# Patient Record
Sex: Female | Born: 1945 | Race: Black or African American | Hispanic: Yes | Marital: Married | Smoking: Never smoker
Health system: Southern US, Community
[De-identification: ages and names within clinical notes are randomized; demographics above are authoritative.]

## PROBLEM LIST (undated history)

## (undated) DIAGNOSIS — R51 Headache: Secondary | ICD-10-CM

## (undated) DIAGNOSIS — R600 Localized edema: Secondary | ICD-10-CM

## (undated) DIAGNOSIS — R002 Palpitations: Secondary | ICD-10-CM

## (undated) DIAGNOSIS — E785 Hyperlipidemia, unspecified: Secondary | ICD-10-CM

## (undated) DIAGNOSIS — R079 Chest pain, unspecified: Secondary | ICD-10-CM

## (undated) DIAGNOSIS — M2550 Pain in unspecified joint: Secondary | ICD-10-CM

## (undated) DIAGNOSIS — Z6841 Body Mass Index (BMI) 40.0 and over, adult: Secondary | ICD-10-CM

## (undated) DIAGNOSIS — R7303 Prediabetes: Secondary | ICD-10-CM

## (undated) DIAGNOSIS — F32A Depression, unspecified: Secondary | ICD-10-CM

## (undated) DIAGNOSIS — M549 Dorsalgia, unspecified: Secondary | ICD-10-CM

## (undated) DIAGNOSIS — L659 Nonscarring hair loss, unspecified: Secondary | ICD-10-CM

## (undated) DIAGNOSIS — R1310 Dysphagia, unspecified: Secondary | ICD-10-CM

## (undated) DIAGNOSIS — G8929 Other chronic pain: Secondary | ICD-10-CM

## (undated) DIAGNOSIS — E739 Lactose intolerance, unspecified: Secondary | ICD-10-CM

## (undated) DIAGNOSIS — N182 Chronic kidney disease, stage 2 (mild): Secondary | ICD-10-CM

## (undated) DIAGNOSIS — K5900 Constipation, unspecified: Secondary | ICD-10-CM

## (undated) DIAGNOSIS — Z86010 Personal history of colonic polyps: Secondary | ICD-10-CM

## (undated) DIAGNOSIS — I10 Essential (primary) hypertension: Secondary | ICD-10-CM

## (undated) DIAGNOSIS — G894 Chronic pain syndrome: Secondary | ICD-10-CM

## (undated) DIAGNOSIS — F329 Major depressive disorder, single episode, unspecified: Secondary | ICD-10-CM

## (undated) DIAGNOSIS — E559 Vitamin D deficiency, unspecified: Secondary | ICD-10-CM

## (undated) DIAGNOSIS — K219 Gastro-esophageal reflux disease without esophagitis: Secondary | ICD-10-CM

## (undated) DIAGNOSIS — G4730 Sleep apnea, unspecified: Secondary | ICD-10-CM

## (undated) DIAGNOSIS — J309 Allergic rhinitis, unspecified: Secondary | ICD-10-CM

## (undated) HISTORY — PX: BREAST EXCISIONAL BIOPSY: SUR124

## (undated) HISTORY — DX: Sleep apnea, unspecified: G47.30

## (undated) HISTORY — DX: Body Mass Index (BMI) 40.0 and over, adult: Z684

## (undated) HISTORY — DX: Morbid (severe) obesity due to excess calories: E66.01

## (undated) HISTORY — DX: Palpitations: R00.2

## (undated) HISTORY — DX: Chronic pain syndrome: G89.4

## (undated) HISTORY — DX: Other chronic pain: G89.29

## (undated) HISTORY — DX: Pain in unspecified joint: M25.50

## (undated) HISTORY — DX: Depression, unspecified: F32.A

## (undated) HISTORY — DX: Essential (primary) hypertension: I10

## (undated) HISTORY — PX: HAND SURGERY: SHX662

## (undated) HISTORY — DX: Nonscarring hair loss, unspecified: L65.9

## (undated) HISTORY — PX: COLONOSCOPY: SHX174

## (undated) HISTORY — DX: Dorsalgia, unspecified: M54.9

## (undated) HISTORY — DX: Constipation, unspecified: K59.00

## (undated) HISTORY — DX: Chest pain, unspecified: R07.9

## (undated) HISTORY — DX: Allergic rhinitis, unspecified: J30.9

## (undated) HISTORY — DX: Lactose intolerance, unspecified: E73.9

## (undated) HISTORY — DX: Vitamin D deficiency, unspecified: E55.9

## (undated) HISTORY — DX: Hyperlipidemia, unspecified: E78.5

## (undated) HISTORY — DX: Headache: R51

## (undated) HISTORY — DX: Localized edema: R60.0

## (undated) HISTORY — DX: Personal history of colonic polyps: Z86.010

## (undated) HISTORY — DX: Dysphagia, unspecified: R13.10

## (undated) HISTORY — PX: VESICOVAGINAL FISTULA CLOSURE W/ TAH: SUR271

## (undated) HISTORY — PX: TUBAL LIGATION: SHX77

## (undated) HISTORY — PX: OTHER SURGICAL HISTORY: SHX169

## (undated) HISTORY — DX: Chronic kidney disease, stage 2 (mild): N18.2

---
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
## 1973-04-16 HISTORY — PX: ABDOMINAL HYSTERECTOMY: SHX81

## 1998-10-20 ENCOUNTER — Ambulatory Visit (HOSPITAL_COMMUNITY): Admission: RE | Admit: 1998-10-20 | Discharge: 1998-10-20 | Payer: Self-pay | Admitting: Gastroenterology

## 1999-10-20 ENCOUNTER — Emergency Department (HOSPITAL_COMMUNITY): Admission: EM | Admit: 1999-10-20 | Discharge: 1999-10-20 | Payer: Self-pay | Admitting: Emergency Medicine

## 2000-09-14 ENCOUNTER — Emergency Department (HOSPITAL_COMMUNITY): Admission: EM | Admit: 2000-09-14 | Discharge: 2000-09-14 | Payer: Self-pay | Admitting: Emergency Medicine

## 2000-09-14 ENCOUNTER — Encounter: Payer: Self-pay | Admitting: Emergency Medicine

## 2000-09-25 ENCOUNTER — Other Ambulatory Visit: Admission: RE | Admit: 2000-09-25 | Discharge: 2000-09-25 | Payer: Self-pay | Admitting: Obstetrics and Gynecology

## 2000-11-26 ENCOUNTER — Ambulatory Visit (HOSPITAL_COMMUNITY): Admission: RE | Admit: 2000-11-26 | Discharge: 2000-11-26 | Payer: Self-pay | Admitting: Gastroenterology

## 2001-03-12 ENCOUNTER — Encounter: Admission: RE | Admit: 2001-03-12 | Discharge: 2001-03-12 | Payer: Self-pay | Admitting: Emergency Medicine

## 2001-03-12 ENCOUNTER — Encounter: Payer: Self-pay | Admitting: Emergency Medicine

## 2001-08-03 ENCOUNTER — Emergency Department (HOSPITAL_COMMUNITY): Admission: EM | Admit: 2001-08-03 | Discharge: 2001-08-03 | Payer: Self-pay | Admitting: Emergency Medicine

## 2001-08-03 ENCOUNTER — Encounter: Payer: Self-pay | Admitting: Emergency Medicine

## 2002-05-15 ENCOUNTER — Ambulatory Visit (HOSPITAL_BASED_OUTPATIENT_CLINIC_OR_DEPARTMENT_OTHER): Admission: RE | Admit: 2002-05-15 | Discharge: 2002-05-15 | Payer: Self-pay | Admitting: Emergency Medicine

## 2003-10-20 ENCOUNTER — Encounter: Admission: RE | Admit: 2003-10-20 | Discharge: 2003-10-20 | Payer: Self-pay | Admitting: Obstetrics and Gynecology

## 2003-11-03 ENCOUNTER — Encounter: Admission: RE | Admit: 2003-11-03 | Discharge: 2003-11-03 | Payer: Self-pay | Admitting: Obstetrics and Gynecology

## 2003-11-12 ENCOUNTER — Encounter: Admission: RE | Admit: 2003-11-12 | Discharge: 2003-11-12 | Payer: Self-pay | Admitting: Obstetrics and Gynecology

## 2004-01-05 ENCOUNTER — Encounter: Admission: RE | Admit: 2004-01-05 | Discharge: 2004-01-05 | Payer: Self-pay | Admitting: Obstetrics and Gynecology

## 2004-07-12 ENCOUNTER — Encounter: Admission: RE | Admit: 2004-07-12 | Discharge: 2004-07-12 | Payer: Self-pay | Admitting: Obstetrics and Gynecology

## 2004-11-07 ENCOUNTER — Encounter: Admission: RE | Admit: 2004-11-07 | Discharge: 2004-11-07 | Payer: Self-pay | Admitting: Obstetrics and Gynecology

## 2005-08-01 ENCOUNTER — Encounter: Admission: RE | Admit: 2005-08-01 | Discharge: 2005-08-01 | Payer: Self-pay | Admitting: Emergency Medicine

## 2005-11-08 ENCOUNTER — Encounter: Admission: RE | Admit: 2005-11-08 | Discharge: 2005-11-08 | Payer: Self-pay | Admitting: Obstetrics and Gynecology

## 2006-03-14 ENCOUNTER — Encounter: Admission: RE | Admit: 2006-03-14 | Discharge: 2006-03-14 | Payer: Self-pay | Admitting: Gastroenterology

## 2006-04-16 HISTORY — PX: EYE SURGERY: SHX253

## 2006-10-03 ENCOUNTER — Encounter: Admission: RE | Admit: 2006-10-03 | Discharge: 2006-10-03 | Payer: Self-pay | Admitting: Emergency Medicine

## 2006-11-04 ENCOUNTER — Encounter: Admission: RE | Admit: 2006-11-04 | Discharge: 2006-11-04 | Payer: Self-pay | Admitting: Emergency Medicine

## 2006-11-11 ENCOUNTER — Encounter: Admission: RE | Admit: 2006-11-11 | Discharge: 2006-11-11 | Payer: Self-pay | Admitting: Obstetrics and Gynecology

## 2006-12-07 ENCOUNTER — Encounter: Admission: RE | Admit: 2006-12-07 | Discharge: 2006-12-07 | Payer: Self-pay | Admitting: Emergency Medicine

## 2007-02-05 ENCOUNTER — Encounter: Admission: RE | Admit: 2007-02-05 | Discharge: 2007-02-05 | Payer: Self-pay | Admitting: Obstetrics and Gynecology

## 2007-08-04 ENCOUNTER — Encounter: Admission: RE | Admit: 2007-08-04 | Discharge: 2007-08-04 | Payer: Self-pay | Admitting: Emergency Medicine

## 2007-11-12 ENCOUNTER — Encounter: Admission: RE | Admit: 2007-11-12 | Discharge: 2007-11-12 | Payer: Self-pay | Admitting: Obstetrics and Gynecology

## 2008-12-14 ENCOUNTER — Encounter: Admission: RE | Admit: 2008-12-14 | Discharge: 2008-12-14 | Payer: Self-pay | Admitting: Obstetrics and Gynecology

## 2009-02-07 ENCOUNTER — Encounter: Admission: RE | Admit: 2009-02-07 | Discharge: 2009-02-07 | Payer: Self-pay | Admitting: Family Medicine

## 2010-02-06 ENCOUNTER — Encounter: Admission: RE | Admit: 2010-02-06 | Discharge: 2010-02-06 | Payer: Self-pay | Admitting: Obstetrics and Gynecology

## 2010-09-01 NOTE — Procedures (Signed)
[place]  Patient:    Beth Sullivan, Beth Sullivan                        MRN: [id number] Proc. Date: 11/27/00 Adm. Date:  [id number] Attending:  Charna Elizabeth CC:         Reuben Likes, M.D.   Procedure Report  DATE OF BIRTH:  1945/10/05  REFERRING PHYSICIAN:  Reuben Likes, M.D.  PROCEDURE PERFORMED:  Esophagogastroduodenoscopy.  ENDOSCOPIST:  Anselmo Rod, M.D.  INSTRUMENT USED:  Olympus video panendoscope.  INDICATIONS FOR PROCEDURE:  Epigastric pain in a 65 year old African-American female rule out ulcer disease.  PREPROCEDURE PREPARATION:  Informed consent was procured from the patient. The patient was fasted for eight hours prior to the procedure.  PREPROCEDURE PHYSICAL:  The patient had stable vital signs.  Neck supple. Chest clear to auscultation.  S1, S2 regular.  Abdomen soft with epigastric tenderness on palpation, no guarding, no rebound, no rigidity, no hepatosplenomegaly.  No masses palpable.  DESCRIPTION OF PROCEDURE:  The patient was placed in left lateral decubitus position and sedated with 70 mg of Demerol and 7 mg of Versed intravenously. Once the patient was adequately sedated and maintained on low-flow oxygen and continuous cardiac monitoring, the Olympus video panendoscope was advanced through the mouthpiece, over the tongue, into the esophagus under direct vision.  The entire esophagus appeared normal without evidence of ring, stricture, masses, lesions, esophagitis or Barretts mucosa.  The scope was then advanced to the stomach.  Except for a small hiatal hernia seen on high retroflexion, the entire gastric mucosa and the proximal small bowel appeared normal.  IMPRESSION:  Essentially normal esophagogastroduodenoscopy except for small hiatal hernia.  RECOMMENDATION: 1. Continue proton pump inhibitor for now. 2. Antireflux measures. 3. Proceed with colonoscopy. 4. Outpatient follow-up for further  recommendations. DD:  11/27/00 TD:  11/27/00 Job: [id number]

## 2010-09-01 NOTE — Procedures (Signed)
[place]. [facility]  Patient:    Beth Sullivan, Beth Sullivan                        MRN: [id number] Proc. Date: 11/27/00 Adm. Date:  [id number] Attending:  Charna Elizabeth CC:         Reuben Likes, M.D.   Procedure Report  DATE OF BIRTH:  Aug 21, 1945.  REFERRING PHYSICIAN:  Reuben Likes, M.D.  PROCEDURE PERFORMED:  Colonoscopy.  ENDOSCOPIST:  Anselmo Rod, M.D.  INSTRUMENT USED:  Olympus pediatric video colonoscope.  INDICATIONS FOR PROCEDURE:  Family history of cancerous polyps in a 65 year old female rule out colonic polyps.  PREPROCEDURE PREPARATION:  Informed consent was procured from the patient. The patient was fasted for eight hours prior to the procedure and prepped with a bottle of magnesium citrate and a gallon of NuLytely the night prior to the procedure.  PREPROCEDURE PHYSICAL:  The patient had stable vital signs.  Neck supple. Chest clear to auscultation.  S1, S2 regular.  Abdomen soft with epigastric tenderness on palpation, no guarding, no rebound, no rigidity, no hepatosplenomegaly.  DESCRIPTION OF PROCEDURE:  The patient was placed in the left lateral decubitus position and sedated with Versed and Demerol for the EGD.  No additional sedation was used for the colonoscopy.  Once the patient was adequately positioned and maintained on low-flow oxygen and continuous cardiac monitoring, the Olympus video colonoscope was advanced from the rectum to the cecum without difficulty.  The entire colonic mucosa appeared healthy with a normal vascular pattern.  No erosions, ulcerations, masses, polyps or diverticula were seen.  IMPRESSION:  Normal colonoscopy.  RECOMMENDATIONS:  Considering the patients family history, repeat colorectal cancer screening is recommended in the next five years unless to were to develop any abnormal symptoms in the interim.DD:  11/27/00 TD:  11/27/00 Job: [id number]

## 2011-01-01 ENCOUNTER — Other Ambulatory Visit: Payer: Self-pay | Admitting: Obstetrics and Gynecology

## 2011-01-01 DIAGNOSIS — Z1231 Encounter for screening mammogram for malignant neoplasm of breast: Secondary | ICD-10-CM

## 2011-02-07 ENCOUNTER — Other Ambulatory Visit: Payer: Self-pay | Admitting: Family Medicine

## 2011-02-07 DIAGNOSIS — R202 Paresthesia of skin: Secondary | ICD-10-CM

## 2011-02-07 DIAGNOSIS — R209 Unspecified disturbances of skin sensation: Secondary | ICD-10-CM

## 2011-02-08 ENCOUNTER — Ambulatory Visit
Admission: RE | Admit: 2011-02-08 | Discharge: 2011-02-08 | Disposition: A | Discharge status: Home / Self Care | Payer: BC Managed Care – PPO | Source: Ambulatory Visit | Attending: Obstetrics and Gynecology | Admitting: Obstetrics and Gynecology

## 2011-02-08 DIAGNOSIS — Z1231 Encounter for screening mammogram for malignant neoplasm of breast: Secondary | ICD-10-CM

## 2011-02-11 ENCOUNTER — Ambulatory Visit
Admission: RE | Admit: 2011-02-11 | Discharge: 2011-02-11 | Disposition: A | Discharge status: Home / Self Care | Payer: BC Managed Care – PPO | Source: Ambulatory Visit | Attending: Family Medicine | Admitting: Family Medicine

## 2011-02-11 DIAGNOSIS — R202 Paresthesia of skin: Secondary | ICD-10-CM

## 2011-02-19 ENCOUNTER — Other Ambulatory Visit: Payer: Self-pay | Admitting: Neurological Surgery

## 2011-02-19 DIAGNOSIS — M542 Cervicalgia: Secondary | ICD-10-CM

## 2011-02-28 ENCOUNTER — Ambulatory Visit
Admission: RE | Admit: 2011-02-28 | Discharge: 2011-02-28 | Disposition: A | Discharge status: Home / Self Care | Payer: BC Managed Care – PPO | Source: Ambulatory Visit | Attending: Neurological Surgery | Admitting: Neurological Surgery

## 2011-02-28 DIAGNOSIS — M542 Cervicalgia: Secondary | ICD-10-CM

## 2011-02-28 MED ORDER — ONDANSETRON HCL 4 MG/2ML IJ SOLN: 4.0000 mg | Freq: Four times a day (QID) | INTRAMUSCULAR | Status: DC | PRN

## 2011-02-28 MED ORDER — DIAZEPAM 5 MG PO TABS
10.0000 mg | ORAL_TABLET | Freq: Once | ORAL | Status: AC
  Administered 2011-02-28: 10 mg via ORAL

## 2011-02-28 MED ORDER — IOHEXOL 300 MG/ML  SOLN: 10.0000 mL | Freq: Once | INTRAMUSCULAR | Status: AC | PRN

## 2011-04-03 ENCOUNTER — Ambulatory Visit
Admission: RE | Admit: 2011-04-03 | Discharge: 2011-04-03 | Disposition: A | Discharge status: Home / Self Care | Payer: BC Managed Care – PPO | Source: Ambulatory Visit | Attending: Neurological Surgery | Admitting: Neurological Surgery

## 2011-04-03 ENCOUNTER — Other Ambulatory Visit: Payer: Self-pay | Admitting: Neurological Surgery

## 2011-08-15 HISTORY — PX: FOOT SURGERY: SHX648

## 2011-12-27 ENCOUNTER — Other Ambulatory Visit: Payer: Self-pay | Admitting: Obstetrics and Gynecology

## 2011-12-27 ENCOUNTER — Other Ambulatory Visit: Payer: Self-pay | Admitting: Family Medicine

## 2011-12-27 DIAGNOSIS — Z1231 Encounter for screening mammogram for malignant neoplasm of breast: Secondary | ICD-10-CM

## 2012-03-03 ENCOUNTER — Ambulatory Visit
Admission: RE | Admit: 2012-03-03 | Discharge: 2012-03-03 | Disposition: A | Discharge status: Home / Self Care | Payer: Medicare Other | Source: Ambulatory Visit | Attending: Family Medicine | Admitting: Family Medicine

## 2012-03-03 DIAGNOSIS — Z1231 Encounter for screening mammogram for malignant neoplasm of breast: Secondary | ICD-10-CM

## 2012-05-09 ENCOUNTER — Institutional Professional Consult (permissible substitution): Payer: Medicare Other | Admitting: Internal Medicine

## 2012-05-21 ENCOUNTER — Encounter: Payer: Self-pay | Admitting: Internal Medicine

## 2012-05-21 ENCOUNTER — Ambulatory Visit (INDEPENDENT_AMBULATORY_CARE_PROVIDER_SITE_OTHER): Payer: Medicare Other | Admitting: Internal Medicine

## 2012-05-21 VITALS — BP 150/80 | HR 90 | Ht 62.5 in | Wt 267.0 lb

## 2012-05-21 DIAGNOSIS — G4733 Obstructive sleep apnea (adult) (pediatric): Secondary | ICD-10-CM

## 2012-05-21 NOTE — Progress Notes (Signed)
05/21/12- 67 yo F seen for sleep medicine evaluation at kind request of Dr Juluis Rainier. Hx sleep study over 10 years ago (pt states seen by CY years ago) Remote sleep study dx'd OSA but she disliked CPAP on her face and was lost to follow-up.  Now c/o snoring that wakes her, sleepy if quiet, including at least once while driving. Bedtime 8:00-9:00 PM, latency 30-45 min, waking 6-7 x before up 7:30-8:00 AM. Weight has drifted up. No ENT surgery, or hx heart, lung or thyroid disease. Treated for HBP Lives w/ husband, retired. Mother died heart disease, no FHX or OSA.   Prior to Admission medications   Medication Sig Start Date End Date Taking? Authorizing Provider  Ascorbic Acid (VITAMIN C) 1000 MG tablet Take 1,000 mg by mouth daily.   Yes Historical Provider, MD  aspirin 81 MG tablet Take 81 mg by mouth daily.   Yes Historical Provider, MD  atorvastatin (LIPITOR) 10 MG tablet Take 10 mg by mouth daily.   Yes Historical Provider, MD  Calcium Carbonate-Vitamin D (CALCIUM + D PO) Take 0.5 tablets by mouth daily.   Yes Historical Provider, MD  chlorthalidone (HYGROTON) 25 MG tablet Take 25 mg by mouth daily.   Yes Historical Provider, MD  cloNIDine (CATAPRES - DOSED IN MG/24 HR) 0.1 mg/24hr patch Place 1 patch onto the skin once a week.   Yes Historical Provider, MD  ibuprofen (ADVIL,MOTRIN) 800 MG tablet Take as directed as needed 05/01/12  Yes Historical Provider, MD  Multiple Vitamin (MULTIVITAMIN) tablet Take 1 tablet by mouth daily.   Yes Historical Provider, MD  Omega-3 Fatty Acids (FISH OIL) 1200 MG CAPS Take 2 capsules by mouth daily.   Yes Historical Provider, MD  spironolactone (ALDACTONE) 25 MG tablet Take 25 mg by mouth daily.   Yes Historical Provider, MD  valACYclovir (VALTREX) 1000 MG tablet Take 1,000 mg by mouth 2 (two) times daily. Prn cold sores   Yes Historical Provider, MD  valsartan (DIOVAN) 320 MG tablet Take 320 mg by mouth daily.   Yes Historical Provider, MD  verapamil  (COVERA HS) 240 MG (CO) 24 hr tablet Take 240 mg by mouth at bedtime.   Yes Historical Provider, MD   Past Medical History  Diagnosis Date  . Hypertension   . Hyperlipemia   . Allergic rhinitis   . Chronic headaches   . Sleep apnea    Past Surgical History  Procedure Date  . Vesicovaginal fistula closure w/ tah   . Foot surgery 08-2011    Left   Family History  Problem Relation Age of Onset  . Heart disease Mother   . Heart disease Brother    History   Social History  . Marital Status: Married    Spouse Name: N/A    Number of Children: 1  . Years of Education: N/A   Occupational History  . retired    Social History Main Topics  . Smoking status: Never Smoker   . Smokeless tobacco: Not on file  . Alcohol Use: No  . Drug Use: No  . Sexually Active: Not on file   Other Topics Concern  . Not on file   Social History Narrative  . No narrative on file   ROS-see HPI Constitutional:   No-   weight loss, night sweats, fevers, chills,+ fatigue, lassitude. HEENT:   No-  headaches, difficulty swallowing, tooth/dental problems, sore throat,       No-  sneezing, itching, ear ache, nasal congestion, post  nasal drip,  CV:  No-   chest pain, orthopnea, PND, swelling in lower extremities, anasarca,                                  dizziness, palpitations Resp: +shortness of breath with exertion or at rest.              No-   productive cough,  No non-productive cough,  No- coughing up of blood.              No-   change in color of mucus.  No- wheezing.   Skin: No-   rash or lesions. GI:  No-   heartburn, indigestion, abdominal pain, nausea, vomiting, diarrhea,                 change in bowel habits, loss of appetite GU: No-   dysuria, change in color of urine, no urgency or frequency.  No- flank pain. MS:  No-   joint pain or swelling.  No- decreased range of motion.  No- back pain. Neuro-     nothing unusual Psych:  No- change in mood or affect. No depression or anxiety.  No  memory loss.  OBJ- Physical Exam General- Alert, Oriented, Affect-appropriate, Distress- none acute. Overweight. Skin- rash-none, lesions- none, excoriation- none Lymphadenopathy- none Head- atraumatic            Eyes- Gross vision intact, PERRLA, conjunctivae and secretions clear            Ears- Hearing, canals-normal            Nose- Clear, no-Septal dev, mucus, polyps, erosion, perforation             Throat- Mallampati II-III , mucosa clear , drainage- none, tonsils- atrophic Neck- flexible , trachea midline, no stridor , thyroid nl, carotid no bruit Chest - symmetrical excursion , unlabored           Heart/CV- RRR , no murmur , no gallop  , no rub, nl s1 s2                           - JVD- none , edema- none, stasis changes- none, varices- none           Lung- clear to P&A, wheeze- none, cough- none , dullness-none, rub- none           Chest wall-  Abd- tender-no, distended-no, bowel sounds-present, HSM- no Br/ Gen/ Rectal- Not done, not indicated Extrem- cyanosis- none, clubbing, none, atrophy- none, strength- nl Neuro- grossly intact to observation

## 2012-05-21 NOTE — Patient Instructions (Addendum)
Order- Split protocol NPSG  Dx OSA  

## 2012-05-23 ENCOUNTER — Ambulatory Visit (HOSPITAL_BASED_OUTPATIENT_CLINIC_OR_DEPARTMENT_OTHER): Payer: Medicare Other | Attending: Internal Medicine

## 2012-05-23 VITALS — Ht 63.0 in | Wt 267.0 lb

## 2012-05-23 DIAGNOSIS — G4733 Obstructive sleep apnea (adult) (pediatric): Secondary | ICD-10-CM

## 2012-05-24 DIAGNOSIS — G4733 Obstructive sleep apnea (adult) (pediatric): Secondary | ICD-10-CM

## 2012-05-24 DIAGNOSIS — R0989 Other specified symptoms and signs involving the circulatory and respiratory systems: Secondary | ICD-10-CM

## 2012-05-24 DIAGNOSIS — R0609 Other forms of dyspnea: Secondary | ICD-10-CM

## 2012-05-25 NOTE — Procedures (Signed)
[name], Beth Sullivan                 ACCOUNT NO.:  [id number]  MEDICAL RECORD NO.:  [id number]         PATIENT TYPE:  OUT  LOCATION:  SLEEP CENTER                 FACILITY:  [facility]  PHYSICIAN:  Clinton D. Maple Hudson, MD, FCCP, FACPDATE OF BIRTH:  [birth year] 06, 1945  DATE OF STUDY:  05/23/2012                           NOCTURNAL POLYSOMNOGRAM  REFERRING PHYSICIAN:  Clinton D. Young, MD, FCCP, FACP  INDICATION FOR STUDY:  Hypersomnia with sleep apnea.  EPWORTH SLEEPINESS SCORE:  13/24.  BMI 47, weight 267 pounds, height 63 inches, neck 14 inches.  MEDICATIONS:  Home medications are charted and reviewed.  SLEEP ARCHITECTURE:  Total sleep time 217 minutes with sleep efficiency 55.1%.  Stage I was 9%, stage II 80.2%, stage III absent, REM 10.8% of total sleep time.  Sleep latency 17.5 minutes, REM latency 242.5 minutes, awake after sleep onset 126.5 minutes.  Arousal index 22.9.  BEDTIME MEDICATION:  None.  RESPIRATORY DATA:  Apnea-hypopnea index (AHI) 42.6 per hour.  A total of 154 events was scored including 72 obstructive apneas, 6 central apneas, 2 mixed apneas, 74 hypopneas.  Events were seen in all sleep positions, particularly while supine and in REM.  REM AHI 102.1 per hour.  The patient was awake from 11:30 until 1:15 a.m. so there was not enough sleep time to qualify for split protocol CPAP titration on this study.  OXYGEN DATA:  Moderate snoring with oxygen desaturation to a nadir of 72% and mean oxygen saturation through the study of 92% on room air.  CARDIAC DATA:  Normal sinus rhythm.  MOVEMENT-PARASOMNIA:  No significant movement disturbance.  Bathroom x2.  IMPRESSIONS-RECOMMENDATIONS: 1. Severe obstructive sleep apnea/hypopnea syndrome, AHI 42.6 per hour     with mainly supine and REM associated events.  REM AHI 102.1 per     hour.  Moderate snoring with oxygen desaturation to a nadir of 72%     and mean oxygen saturation through the study of 92% on room air. 2. Because she  was awake between 11:45 and 1:15 a.m., split protocol     CPAP titration could not be performed.     Consider return for dedicated CPAP titration study or evaluate for     alternative management as clinically appropriate.     Clinton D. Maple Hudson, MD, [facility], FACP Diplomate, American Board of Sleep Medicine    CDY/MEDQ  D:  05/24/2012 12:32:56  T:  05/24/2012 13:16:39  Job:  [id number]

## 2012-05-29 DIAGNOSIS — G4733 Obstructive sleep apnea (adult) (pediatric): Secondary | ICD-10-CM | POA: Insufficient documentation

## 2012-05-29 NOTE — Assessment & Plan Note (Signed)
She seeks new attention because she is aware of excessive daytime sleepiness and worried about her driving. I emphasized her responsibility to drive safely or did somebody else to drive for and she accepted this. We reviewed the basics of sleep apnea the importance of good sleep hygiene and weight control. Plan-schedule sleep study, then return to review options

## 2012-06-30 ENCOUNTER — Ambulatory Visit: Payer: Medicare Other | Admitting: Internal Medicine

## 2012-07-21 ENCOUNTER — Encounter: Payer: Self-pay | Admitting: Internal Medicine

## 2012-07-21 ENCOUNTER — Ambulatory Visit (INDEPENDENT_AMBULATORY_CARE_PROVIDER_SITE_OTHER): Payer: Medicare Other | Admitting: Internal Medicine

## 2012-07-21 VITALS — BP 126/70 | HR 89 | Ht 62.0 in | Wt 260.8 lb

## 2012-07-21 DIAGNOSIS — G4733 Obstructive sleep apnea (adult) (pediatric): Secondary | ICD-10-CM

## 2012-07-21 NOTE — Progress Notes (Signed)
05/21/12- 67 yo F seen for sleep medicine evaluation at kind request of Dr Juluis Rainier. Hx sleep study over 10 years ago (pt states seen by CY years ago) Remote sleep study dx'd OSA but she disliked CPAP on her face and was lost to follow-up.  Now c/o snoring that wakes her, sleepy if quiet, including at least once while driving. Bedtime 8:00-9:00 PM, latency 30-45 min, waking 6-7 x before up 7:30-8:00 AM. Weight has drifted up. No ENT surgery, or hx heart, lung or thyroid disease. Treated for HBP Lives w/ husband, retired. Mother died heart disease, no FHX or OSA.   07/21/12- 63 yo F seen for sleep medicine evaluation at kind request of Dr Juluis Rainier. Hx sleep study over 10 years ago (pt states seen by CY years ago) FOLLOWS FOR: review new Sleep Study results with patient. Describes occasional sleep paralysis or difficulty waking from dream. NPSG 05/23/12- AHI 42.6 per hour. Weight 267 pounds We discussed the medical significance and treatment options, possibilities drive safely and the importance of her weight.  ROS-see HPI Constitutional:   No-   weight loss, night sweats, fevers, chills,+ fatigue, lassitude. HEENT:   No-  headaches, difficulty swallowing, tooth/dental problems, sore throat,       No-  sneezing, itching, ear ache, nasal congestion, post nasal drip,  CV:  No-   chest pain, orthopnea, PND, swelling in lower extremities, anasarca,                                  dizziness, palpitations Resp: +shortness of breath with exertion or at rest.       No-   productive cough,  No non-productive cough,  No- coughing up of blood.              No-   change in color of mucus.  No- wheezing.   Skin: No-   rash or lesions. GI:  No-   heartburn, indigestion, abdominal pain, nausea, vomiting,  GU:  MS:  No-   joint pain or swelling.   Neuro-     nothing unusual Psych:  No- change in mood or affect. No depression or anxiety.  No memory loss.  OBJ- Physical Exam General- Alert,  Oriented, Affect-appropriate, Distress- none acute. Overweight. Skin- rash-none, lesions- none, excoriation- none Lymphadenopathy- none Head- atraumatic            Eyes- Gross vision intact, PERRLA, conjunctivae and secretions clear            Ears- Hearing, canals-normal            Nose- Clear, no-Septal dev, mucus, polyps, erosion, perforation             Throat- Mallampati II-III , mucosa clear , drainage- none, tonsils- atrophic Neck- flexible , trachea midline, no stridor , thyroid nl, carotid no bruit Chest - symmetrical excursion , unlabored           Heart/CV- RRR , no murmur , no gallop  , no rub, nl s1 s2                           - JVD- none , edema- none, stasis changes- none, varices- none           Lung- clear to P&A, wheeze- none, cough- none , dullness-none, rub- none  Chest wall-  Abd-  Br/ Gen/ Rectal- Not done, not indicated Extrem- cyanosis- none, clubbing, none, atrophy- none, strength- nl Neuro- grossly intact to observation

## 2012-07-21 NOTE — Patient Instructions (Addendum)
Order [facility] DME new CPAP autotitrate x 7 days 5-15 cwp for pressure recommendation, mask of choice, humidifier, supplies   Dx OSA

## 2012-07-27 NOTE — Assessment & Plan Note (Signed)
Severe obstructive sleep apnea was explained to her.Peri [place] discussion of treatment options and implications. She is willing to try again.

## 2012-08-04 ENCOUNTER — Encounter: Payer: Self-pay | Admitting: *Deleted

## 2012-09-09 ENCOUNTER — Encounter: Payer: Self-pay | Admitting: Internal Medicine

## 2012-09-09 ENCOUNTER — Ambulatory Visit (INDEPENDENT_AMBULATORY_CARE_PROVIDER_SITE_OTHER)
Admission: RE | Admit: 2012-09-09 | Discharge: 2012-09-09 | Disposition: A | Discharge status: Home / Self Care | Payer: Medicare Other | Source: Ambulatory Visit | Attending: Internal Medicine | Admitting: Internal Medicine

## 2012-09-09 ENCOUNTER — Ambulatory Visit (INDEPENDENT_AMBULATORY_CARE_PROVIDER_SITE_OTHER): Payer: Medicare Other | Admitting: Internal Medicine

## 2012-09-09 VITALS — BP 118/66 | HR 72 | Ht 62.0 in | Wt 257.2 lb

## 2012-09-09 DIAGNOSIS — J40 Bronchitis, not specified as acute or chronic: Secondary | ICD-10-CM

## 2012-09-09 DIAGNOSIS — G4733 Obstructive sleep apnea (adult) (pediatric): Secondary | ICD-10-CM

## 2012-09-09 DIAGNOSIS — J209 Acute bronchitis, unspecified: Secondary | ICD-10-CM

## 2012-09-09 MED ORDER — ZOLPIDEM TARTRATE 5 MG PO TABS: 5.0000 mg | ORAL_TABLET | Freq: Every evening | ORAL | Status: DC | PRN

## 2012-09-09 MED ORDER — DOXYCYCLINE HYCLATE 100 MG PO TABS: ORAL_TABLET | ORAL | Status: DC

## 2012-09-09 NOTE — Patient Instructions (Addendum)
Order- DME APS change CPAP to fixed CPAP 12 cwp     Dx OSA  Script for ambien to use occasionally for sleep if needed  Script for doxycycline antibiotic for bronbchitis  Order CXR  Dx bronchitis

## 2012-09-09 NOTE — Progress Notes (Signed)
05/21/12- 67 yo F seen for sleep medicine evaluation at kind request of Dr Juluis Rainier. Hx sleep study over 10 years ago (pt states seen by CY years ago) Remote sleep study dx'd OSA but she disliked CPAP on her face and was lost to follow-up.  Now c/o snoring that wakes her, sleepy if quiet, including at least once while driving. Bedtime 8:00-9:00 PM, latency 30-45 min, waking 6-7 x before up 7:30-8:00 AM. Weight has drifted up. No ENT surgery, or hx heart, lung or thyroid disease. Treated for HBP Lives w/ husband, retired. Mother died heart disease, no FHX or OSA.   07/21/12- 52 yo F seen for sleep medicine evaluation at kind request of Dr Juluis Rainier. Hx sleep study over 10 years ago (pt states seen by CY years ago) FOLLOWS FOR: review new Sleep Study results with patient. Describes occasional sleep paralysis or difficulty waking from dream. NPSG 05/23/12- AHI 42.6 per hour. Weight 267 pounds We discussed the medical significance and treatment options, responsibility to drive safely and the importance of her weight.  09/09/12- 6 yo F seen for OSA      PCP Dr Juluis Rainier. FOLLOWS FOR: Wears CPAP autoPap/ APS every night for about 6-8 hours; pressure working well for patient. Some days residual sleepiness. Recognizes she is better off with CPAP. Sometimes insomnia/busy brain. Download confirms good compliance and fair control. Fixed CPAP pressure would be 12. Had a flu syndrome and leg when worry with productive cough. Still coughing some clear or yellow sputum.  ROS-see HPI Constitutional:   No-   weight loss, night sweats, fevers, chills,+ fatigue, lassitude. HEENT:   No-  headaches, difficulty swallowing, tooth/dental problems, sore throat,       No-  sneezing, itching, ear ache, nasal congestion, post nasal drip,  CV:  No-   chest pain, orthopnea, PND, swelling in lower extremities, anasarca,                                  dizziness, palpitations Resp: +shortness of breath with  exertion or at rest.       No-   productive cough,  No non-productive cough,  No- coughing up of blood.              No-   change in color of mucus.  No- wheezing.   Skin: No-   rash or lesions. GI:  No-   heartburn, indigestion, abdominal pain, nausea, vomiting,  GU:  MS:  No-   joint pain or swelling.   Neuro-     nothing unusual Psych:  No- change in mood or affect. No depression or anxiety.  No memory loss.  OBJ- Physical Exam General- Alert, Oriented, Affect-appropriate, Distress- none acute. Overweight. Skin- rash-none, lesions- none, excoriation- none Lymphadenopathy- none Head- atraumatic            Eyes- Gross vision intact, PERRLA, conjunctivae and secretions clear            Ears- Hearing, canals-normal            Nose- Clear, no-Septal dev, mucus, polyps, erosion, perforation             Throat- Mallampati II-III , mucosa clear , drainage- none, tonsils- atrophic Neck- flexible , trachea midline, no stridor , thyroid nl, carotid no bruit Chest - symmetrical excursion , unlabored           Heart/CV- RRR , no  murmur , no gallop  , no rub, nl s1 s2                           - JVD- none , edema- none, stasis changes- none, varices- none           Lung- clear to P&A, wheeze- none, cough+raspy , dullness-none, rub- none           Chest wall-  Abd-  Br/ Gen/ Rectal- Not done, not indicated Extrem- cyanosis- none, clubbing, none, atrophy- none, strength- nl Neuro- grossly intact to observation

## 2012-09-17 ENCOUNTER — Telehealth: Payer: Self-pay | Admitting: Internal Medicine

## 2012-09-17 NOTE — Telephone Encounter (Signed)
Notes Recorded by Waymon Budge, MD on 09/10/2012 at 8:07 AM CXR- lungs are clear- no pneumonia ---  I spoke with patient about results and she verbalized understanding and had no questions

## 2012-09-21 DIAGNOSIS — J209 Acute bronchitis, unspecified: Secondary | ICD-10-CM | POA: Insufficient documentation

## 2012-09-21 NOTE — Assessment & Plan Note (Signed)
Lingering productive cough since a flu syndrome in February. It bothers her enough that she mentions it today. Plan-doxycycline, chest x-ray

## 2012-09-21 NOTE — Assessment & Plan Note (Signed)
Weight loss is encouraged. Plan-change CPAP to a fixed pressure 12 Address incidental insomnia with Ambien after discussion

## 2012-11-19 ENCOUNTER — Other Ambulatory Visit: Payer: Self-pay

## 2013-02-02 ENCOUNTER — Other Ambulatory Visit: Payer: Self-pay

## 2013-02-02 DIAGNOSIS — Z1231 Encounter for screening mammogram for malignant neoplasm of breast: Secondary | ICD-10-CM

## 2013-02-03 ENCOUNTER — Other Ambulatory Visit: Payer: Self-pay | Admitting: Family Medicine

## 2013-02-03 DIAGNOSIS — N6320 Unspecified lump in the left breast, unspecified quadrant: Secondary | ICD-10-CM

## 2013-02-03 DIAGNOSIS — N6452 Nipple discharge: Secondary | ICD-10-CM

## 2013-02-16 ENCOUNTER — Ambulatory Visit
Admission: RE | Admit: 2013-02-16 | Discharge: 2013-02-16 | Disposition: A | Discharge status: Home / Self Care | Payer: Medicare Other | Source: Ambulatory Visit | Attending: Family Medicine | Admitting: Family Medicine

## 2013-02-16 ENCOUNTER — Other Ambulatory Visit: Payer: Self-pay | Admitting: Family Medicine

## 2013-02-16 DIAGNOSIS — N6320 Unspecified lump in the left breast, unspecified quadrant: Secondary | ICD-10-CM

## 2013-02-16 DIAGNOSIS — N6452 Nipple discharge: Secondary | ICD-10-CM

## 2013-02-16 IMAGING — US US BREAST*L*
1 series · 8 of 8 positions shown · non-contrast
Comparison: [DATE], [DATE], [DATE]

CLINICAL DATA: Subareolar left lump, nonspontaneous clear left
discharge from a single duct

EXAM:
DIGITAL DIAGNOSTIC  BILATERAL MAMMOGRAM WITH CAD
ULTRASOUND LEFT BREAST

[Series 1: us breast*left* · 8 of 8 slices shown]
[im 1/8]
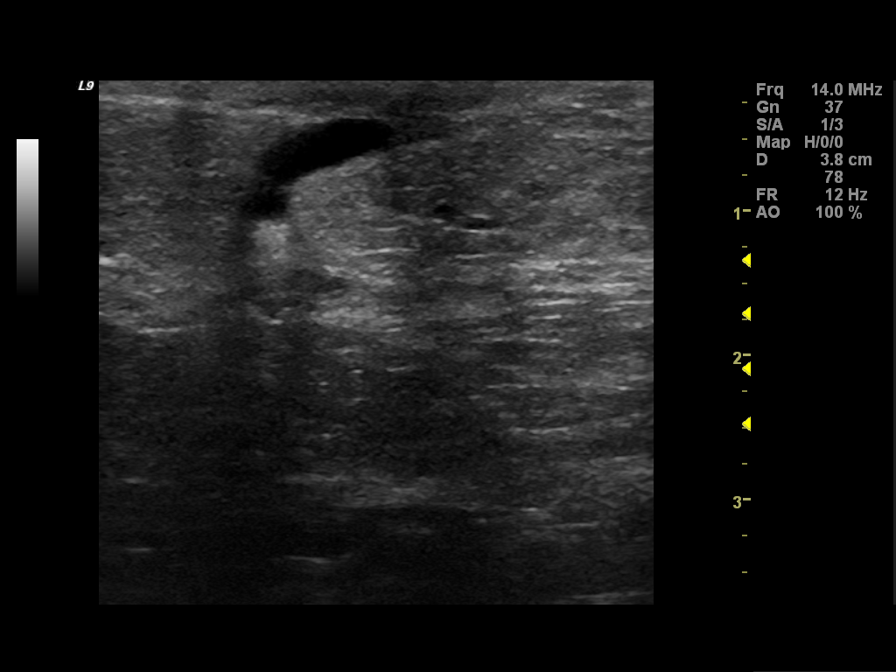
[im 2/8]
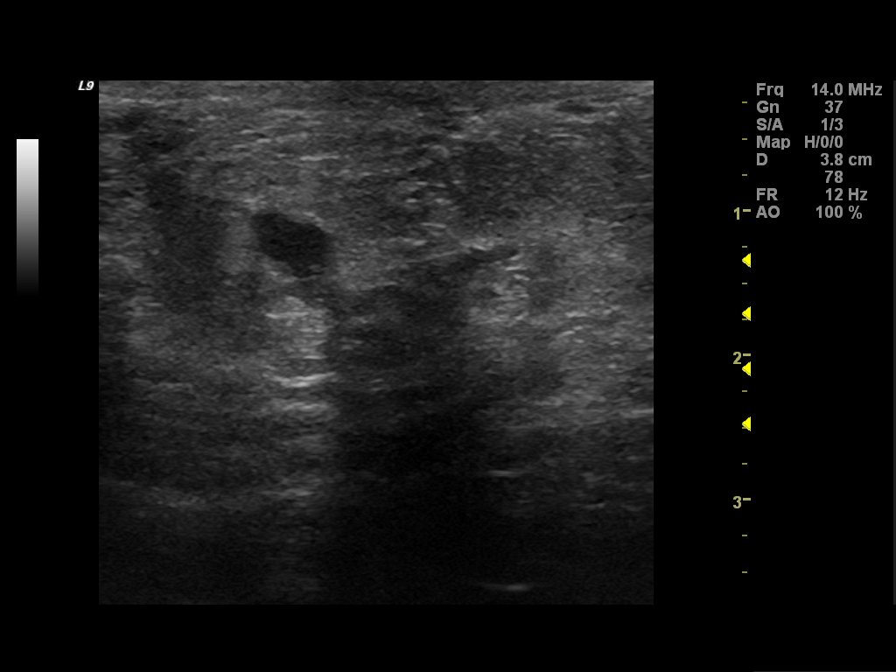
[im 3/8]
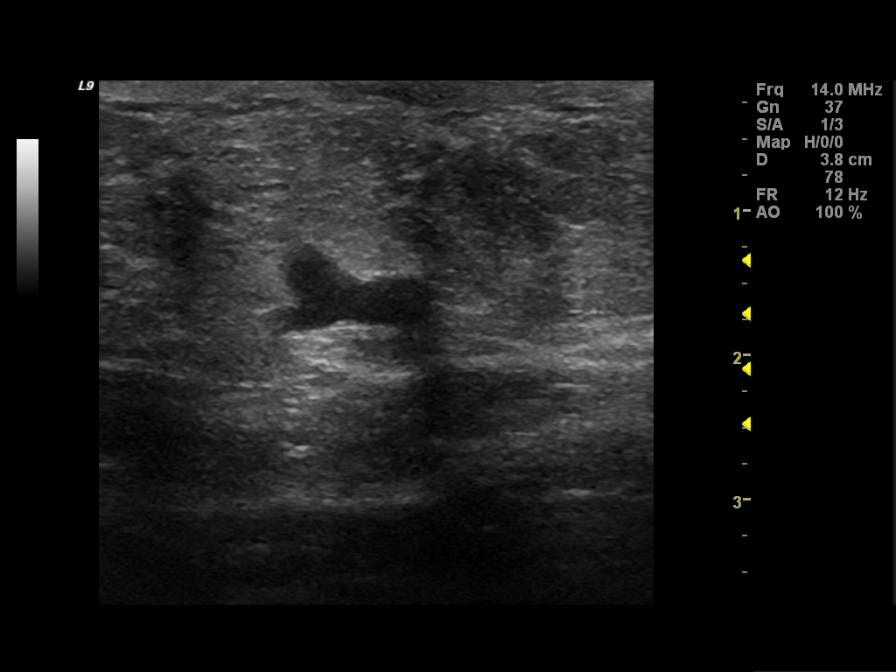
[im 4/8]
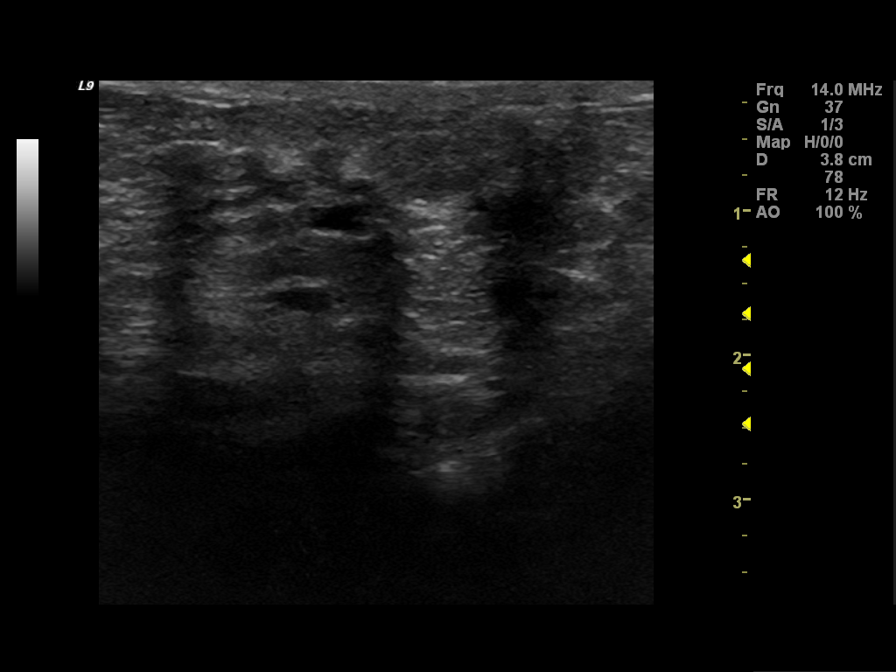
[im 5/8]
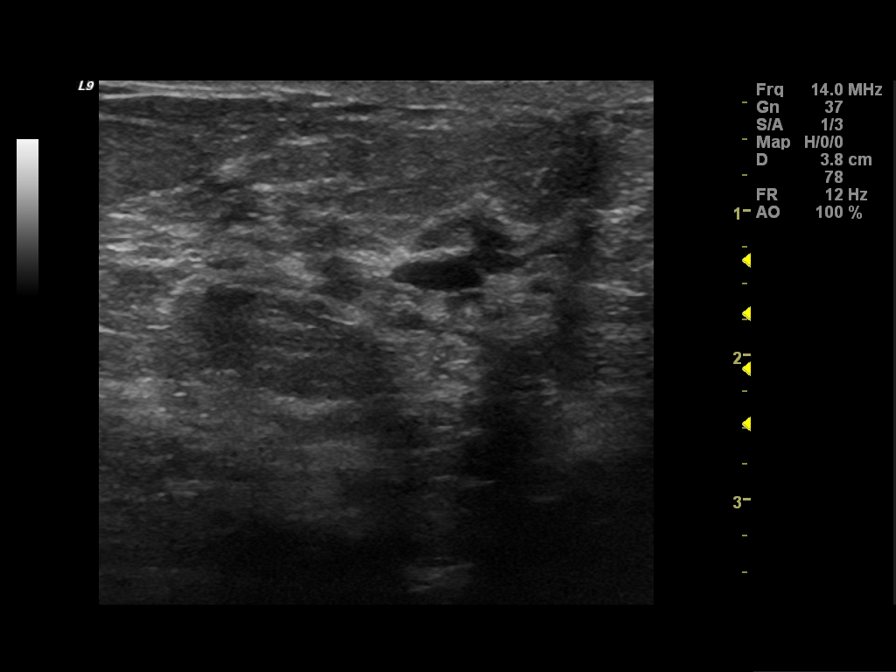
[im 6/8]
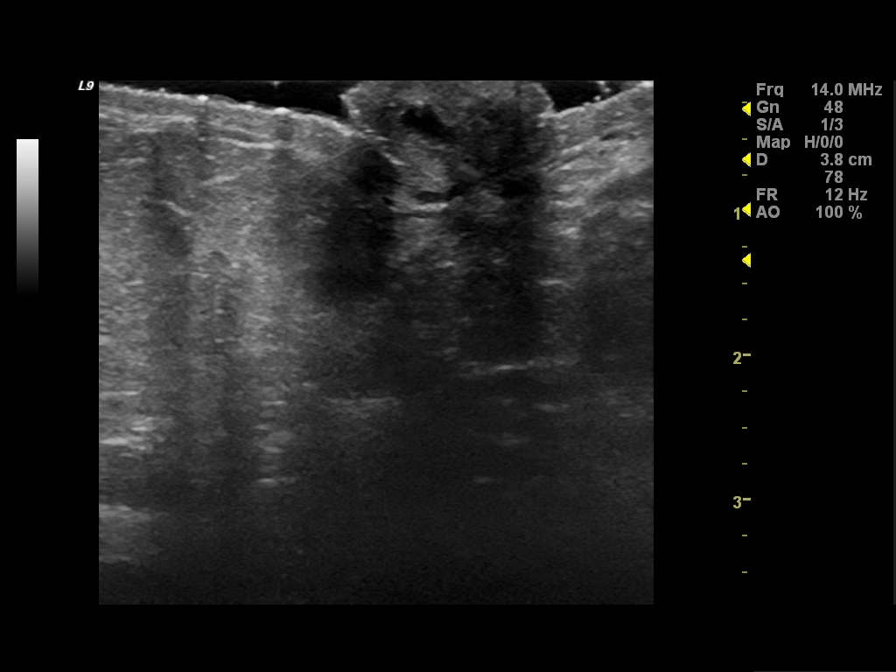
[im 7/8]
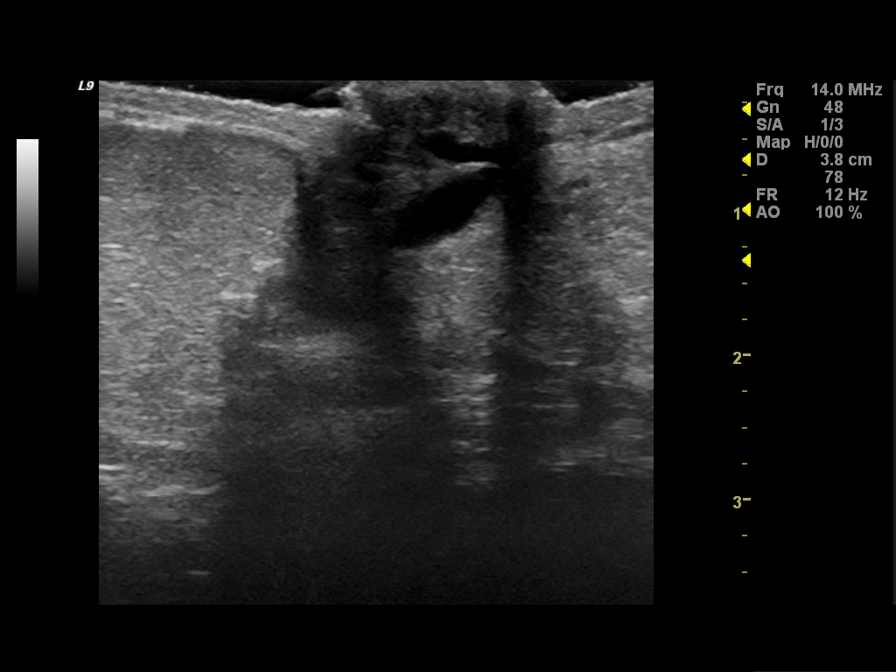
[im 8/8]
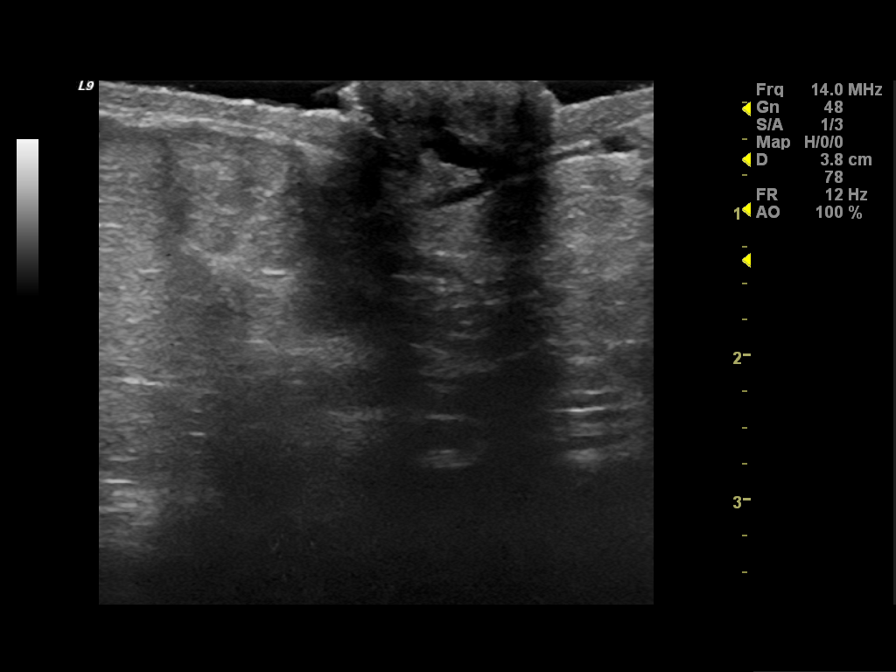

[8 of 8 positions shown; findings below may reference images not displayed]

ACR Breast Density Category b: There are scattered areas of
fibroglandular density.
FINDINGS: Benign bilateral secret or calcifications are stable. No mass or
architectural distortion on either side.

Mammographic images were processed with CAD.

On physical exam,there is palpable tissue immediately deep to the
left areola. There is easily express oval fairly abundant clear
discharge from the duct orifice located centrally on the left
nipple.

Ultrasound is performed, showing that the palpable tissue represents
duct ectasia. There is moderate left duct ectasia with no
intraductal mass. There are no other ultrasound findings on the
left. For the purpose of comparison the right periareolar area was
scanned and showed mild duct ectasia. The discharging duct can be
seen extending into the nipple.
IMPRESSION: No mammographic abnormalities. There is suspicious left discharge
but ultrasound shows only duct ectasia.

RECOMMENDATION:
The patient will undergo ductogram this morning.

I have discussed the findings and recommendations with the patient.
Results were also provided in writing at the conclusion of the
visit.

BI-RADS CATEGORY  0: Incomplete. Need additional imaging evaluation
and/or prior mammograms for comparison.

## 2013-02-17 ENCOUNTER — Other Ambulatory Visit: Payer: Self-pay | Admitting: Family Medicine

## 2013-02-17 DIAGNOSIS — N6452 Nipple discharge: Secondary | ICD-10-CM

## 2013-02-19 ENCOUNTER — Other Ambulatory Visit: Payer: Self-pay

## 2013-03-16 ENCOUNTER — Ambulatory Visit: Payer: Medicare Other | Admitting: Internal Medicine

## 2013-03-19 ENCOUNTER — Ambulatory Visit
Admission: RE | Admit: 2013-03-19 | Discharge: 2013-03-19 | Disposition: A | Discharge status: Home / Self Care | Payer: 59 | Source: Ambulatory Visit | Attending: Family Medicine | Admitting: Family Medicine

## 2013-03-19 DIAGNOSIS — N6452 Nipple discharge: Secondary | ICD-10-CM

## 2013-03-19 MED ORDER — GADOBENATE DIMEGLUMINE 529 MG/ML IV SOLN
20.0000 mL | Freq: Once | INTRAVENOUS | Status: AC | PRN
  Administered 2013-03-19: 20 mL via INTRAVENOUS

## 2013-03-23 ENCOUNTER — Encounter: Payer: Self-pay | Admitting: Internal Medicine

## 2013-03-23 ENCOUNTER — Other Ambulatory Visit: Payer: Self-pay | Admitting: Family Medicine

## 2013-03-23 ENCOUNTER — Ambulatory Visit (INDEPENDENT_AMBULATORY_CARE_PROVIDER_SITE_OTHER): Payer: Medicare Other | Admitting: Internal Medicine

## 2013-03-23 VITALS — BP 128/72 | HR 98 | Ht 62.0 in | Wt 265.2 lb

## 2013-03-23 DIAGNOSIS — G4733 Obstructive sleep apnea (adult) (pediatric): Secondary | ICD-10-CM

## 2013-03-23 DIAGNOSIS — J209 Acute bronchitis, unspecified: Secondary | ICD-10-CM

## 2013-03-23 DIAGNOSIS — R928 Other abnormal and inconclusive findings on diagnostic imaging of breast: Secondary | ICD-10-CM

## 2013-03-23 NOTE — Assessment & Plan Note (Signed)
Good compliance and control. Weight los would help.

## 2013-03-23 NOTE — Patient Instructions (Signed)
We can continue CPAP 12/ APS  Please call if we can help.

## 2013-03-23 NOTE — Progress Notes (Signed)
05/21/12- 67 yo F seen for sleep medicine evaluation at kind request of Dr Juluis Rainier. Hx sleep study over 10 years ago (pt states seen by [facility] years ago) Remote sleep study dx'd OSA but she disliked CPAP on her face and was lost to follow-up.  Now c/o snoring that wakes her, sleepy if quiet, including at least once while driving. Bedtime 8:00-9:00 PM, latency 30-45 min, waking 6-7 x before up 7:30-8:00 AM. Weight has drifted up. No ENT surgery, or hx heart, lung or thyroid disease. Treated for HBP Lives w/ husband, retired. Mother died heart disease, no FHX or OSA.   07/21/12- 22 yo F seen for sleep medicine evaluation at kind request of Dr Juluis Rainier. Hx sleep study over 10 years ago (pt states seen by [facility] years ago) FOLLOWS FOR: review new Sleep Study results with patient. Describes occasional sleep paralysis or difficulty waking from dream. NPSG 05/23/12- AHI 42.6 per hour. Weight 267 pounds We discussed the medical significance and treatment options, responsibility to drive safely and the importance of her weight.  09/09/12- 6 yo F seen for OSA      PCP Dr Juluis Rainier. FOLLOWS FOR: Wears CPAP autoPap/ APS every night for about 6-8 hours; pressure working well for patient. Some days residual sleepiness. Recognizes she is better off with CPAP. Sometimes insomnia/busy brain. Download confirms good compliance and fair control. Fixed CPAP pressure would be 12. Had a flu syndrome with productive cough. Still coughing some clear or yellow sputum.  03/23/13- 76 yo F seen for OSA      PCP Dr Juluis Rainier. FOLLOWS FOR:  Wearing CPAP 12/ APS  5-8 hours per night.  No complaints Likes this machine much better and feels pressure is right. Nasal pillows. Discussed humidifier. Getting over incidental URI now. She resolved bronchitis treated last Spring. Post viral bronchitis symptoms tend to linger for her. CXR 09/09/12 CHEST - 2 VIEW  Comparison: 11/13/2011.  Findings: Trachea is midline.  Heart size normal. Lungs are low in  volume but clear. No pleural fluid.  IMPRESSION:  No acute findings.  Original Report Authenticated By: Leanna Battles, M.D.   ROS-see HPI Constitutional:   No-   weight loss, night sweats, fevers, chills, fatigue, lassitude. HEENT:   No-  headaches, difficulty swallowing, tooth/dental problems, sore throat,       No-  sneezing, itching, ear ache, +nasal congestion, post nasal drip,  CV:  No-   chest pain, orthopnea, PND, swelling in lower extremities, anasarca, dizziness, palpitations Resp: +shortness of breath with exertion or at rest.       No-   productive cough,  No non-productive cough,  No- coughing up of blood.              No-   change in color of mucus.  No- wheezing.   Skin: No-   rash or lesions. GI:  No-   heartburn, indigestion, abdominal pain, nausea, vomiting,  GU:  MS:  No-   joint pain or swelling.   Neuro-     nothing unusual Psych:  No- change in mood or affect. No depression or anxiety.  No memory loss.  OBJ- Physical Exam General- Alert, Oriented, Affect-appropriate, Distress- none acute. Overweight. Skin- rash-none, lesions- none, excoriation- none Lymphadenopathy- none Head- atraumatic            Eyes- Gross vision intact, PERRLA, conjunctivae and secretions clear            Ears- Hearing, canals-normal  Nose- Clear, no-Septal dev, mucus, polyps, erosion, perforation             Throat- Mallampati II , mucosa clear , drainage- none, tonsils- atrophic Neck- flexible , trachea midline, no stridor , thyroid nl, carotid no bruit Chest - symmetrical excursion , unlabored           Heart/CV- RRR , no murmur , no gallop  , no rub, nl s1 s2                           - JVD- none , edema- none, stasis changes- none, varices- none           Lung- clear to P&A, wheeze- none, cough-none, dullness-none, rub- none           Chest wall-  Abd-  Br/ Gen/ Rectal- Not done, not indicated Extrem- cyanosis- none, clubbing, none,  atrophy- none, strength- nl Neuro- grossly intact to observation

## 2013-03-23 NOTE — Assessment & Plan Note (Signed)
CXR was clear. Common to get sustained bronchitis after colds. Manage symptomatically.

## 2013-03-25 ENCOUNTER — Ambulatory Visit: Payer: Medicare Other

## 2013-03-30 ENCOUNTER — Ambulatory Visit
Admission: RE | Admit: 2013-03-30 | Discharge: 2013-03-30 | Disposition: A | Discharge status: Home / Self Care | Payer: 59 | Source: Ambulatory Visit | Attending: Family Medicine | Admitting: Family Medicine

## 2013-03-30 DIAGNOSIS — R928 Other abnormal and inconclusive findings on diagnostic imaging of breast: Secondary | ICD-10-CM

## 2013-03-30 MED ORDER — GADOBENATE DIMEGLUMINE 529 MG/ML IV SOLN
20.0000 mL | Freq: Once | INTRAVENOUS | Status: AC | PRN
  Administered 2013-03-30: 20 mL via INTRAVENOUS

## 2013-04-20 ENCOUNTER — Ambulatory Visit (INDEPENDENT_AMBULATORY_CARE_PROVIDER_SITE_OTHER): Payer: Medicare Other | Admitting: Surgery

## 2013-04-20 ENCOUNTER — Encounter (INDEPENDENT_AMBULATORY_CARE_PROVIDER_SITE_OTHER): Payer: Self-pay | Admitting: Surgery

## 2013-04-20 VITALS — BP 122/80 | HR 80 | Temp 97.6°F | Resp 14 | Ht 62.0 in | Wt 268.0 lb

## 2013-04-20 DIAGNOSIS — D249 Benign neoplasm of unspecified breast: Secondary | ICD-10-CM

## 2013-04-20 DIAGNOSIS — D242 Benign neoplasm of left breast: Secondary | ICD-10-CM

## 2013-04-20 NOTE — Progress Notes (Signed)
Patient ID: Beth Sullivan, female   DOB: 06/07/1945, 68 y.o.   MRN: [id number]  No chief complaint on file.   HPI Beth Sullivan is a 67 y.o. female.  Patient sent at the request of Dr. Elizabeth Barnes for left breast nipple discharge x4 months. Discharge is clear in nature. It is intermittent. It is minimal in amount. Left breast mammogram, MRI and ultrasound of the done and prominent central dilated left breast duct identified and core biopsy done. It shows sclerosing papilloma. Patient sent at the request of Dr. Barnes for this. Patient denies any new breast masses, pain or any bloody discharge. HPI  Past Medical History  Diagnosis Date  . Hypertension   . Hyperlipemia   . Allergic rhinitis   . Chronic headaches   . Sleep apnea     Past Surgical History  Procedure Laterality Date  . Vesicovaginal fistula closure w/ tah    . Foot surgery  08-2011    Left    Family History  Problem Relation Age of Onset  . Heart disease Mother   . Heart disease Brother     Social History History  Substance Use Topics  . Smoking status: Never Smoker   . Smokeless tobacco: Not on file  . Alcohol Use: No    No Known Allergies  Current Outpatient Prescriptions  Medication Sig Dispense Refill  . Ascorbic Acid (VITAMIN C) 1000 MG tablet Take 1,000 mg by mouth daily.      . aspirin 81 MG tablet Take 81 mg by mouth daily.      . atorvastatin (LIPITOR) 10 MG tablet Take 10 mg by mouth daily.      . Calcium Carbonate-Vitamin D (CALCIUM + D PO) Take 1 tablet by mouth daily.       . chlorthalidone (HYGROTON) 25 MG tablet Take 25 mg by mouth daily.      . cloNIDine (CATAPRES - DOSED IN MG/24 HR) 0.1 mg/24hr patch Place 1 patch onto the skin once a week.      . ibuprofen (ADVIL,MOTRIN) 800 MG tablet Take as directed as needed      . Multiple Vitamin (MULTIVITAMIN) tablet Take 1 tablet by mouth daily.      . Omega-3 Fatty Acids (FISH OIL) 1200 MG CAPS Take 2 capsules by mouth daily.      .  spironolactone (ALDACTONE) 25 MG tablet Take 25 mg by mouth daily.      . valACYclovir (VALTREX) 1000 MG tablet Take 1,000 mg by mouth 2 (two) times daily. Prn cold sores      . valsartan (DIOVAN) 320 MG tablet Take 320 mg by mouth daily.      . verapamil (COVERA HS) 240 MG (CO) 24 hr tablet Take 240 mg by mouth 2 (two) times daily.        No current facility-administered medications for this visit.    Review of Systems Review of Systems  Constitutional: Negative for fever, chills and unexpected weight change.  HENT: Negative for congestion, hearing loss, sore throat, trouble swallowing and voice change.   Eyes: Negative for visual disturbance.  Respiratory: Negative for cough and wheezing.   Cardiovascular: Negative for chest pain, palpitations and leg swelling.  Gastrointestinal: Negative for nausea, vomiting, abdominal pain, diarrhea, constipation, blood in stool, abdominal distention and anal bleeding.  Genitourinary: Negative for hematuria, vaginal bleeding and difficulty urinating.  Musculoskeletal: Negative for arthralgias.  Skin: Negative for rash and wound.  Neurological: Negative for seizures, syncope and headaches.    Hematological: Negative for adenopathy. Does not bruise/bleed easily.  Psychiatric/Behavioral: Negative for confusion.    Blood pressure 122/80, pulse 80, temperature 97.6 F (36.4 C), resp. rate 14, height 5\' 2"  (1.575 m), weight 268 lb (121.564 kg).  Physical Exam Physical Exam  Constitutional: She is oriented to person, place, and time. She appears well-developed and well-nourished.  HENT:  Head: Normocephalic and atraumatic.  Eyes: Pupils are equal, round, and reactive to light. No scleral icterus.  Neck: Normal range of motion.  Cardiovascular: Normal rate and regular rhythm.   Pulmonary/Chest: Effort normal and breath sounds normal. Right breast exhibits no inverted nipple, no mass, no nipple discharge, no skin change and no tenderness. Left breast  exhibits nipple discharge. Left breast exhibits no inverted nipple, no mass, no skin change and no tenderness. Breasts are symmetrical.  Musculoskeletal: Normal range of motion.  Neurological: She is alert and oriented to person, place, and time.  Skin: Skin is warm and dry.  Psychiatric: She has a normal mood and affect. Her behavior is normal. Judgment and thought content normal.    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 Small risk of malignancy associated with this condition of 0-4%. Risks, benefits and alternatives to surgery discussed. Patient would like to proceed with left breast needle localized lumpectomy.The procedure has been discussed with the patient. Alternatives to surgery have been discussed with the patient.  Risks of surgery include bleeding,  Infection,  Seroma formation, death,  and the need for further surgery.   The patient understands and wishes to proceed.       Lorita Forinash A. 04/20/2013, 12:31 PM

## 2013-04-20 NOTE — Patient Instructions (Signed)
Lumpectomy A lumpectomy is a form of "breast conserving" or "breast preservation" surgery. It may also be referred to as a partial mastectomy. During a lumpectomy, the portion of the breast that contains the cancerous tumor or breast mass (the lump) is removed. Some normal tissue around the lump may also be removed to make sure all the tumor has been removed. This surgery should take 40 minutes or less. LET YOUR HEALTH CARE PROVIDER KNOW ABOUT:  Any allergies you have.  All medicines you are taking, including vitamins, herbs, eye drops, creams, and over-the-counter medicines.  Previous problems you or members of your family have had with the use of anesthetics.  Any blood disorders you have.  Previous surgeries you have had.  Medical conditions you have. RISKS AND COMPLICATIONS Generally, this is a safe procedure. However, as with any procedure, complications can occur. Possible complications include:  Bleeding.  Infection.  Pain.  Temporary swelling.  Change in the shape of the breast, particularly if a large portion is removed. BEFORE THE PROCEDURE  Ask your health care provider about changing or stopping your regular medicines.  Do not eat or drink anything for 7 8 hours before the surgery or as directed by your health care provider. Ask your health care provider if you can take a sip of water with any approved medicines.  On the day of surgery, your healthcare provider will use a mammogram or ultrasound to locate and mark the tumor in your breast. These markings on your breast will show where the cut (incision) will be made. PROCEDURE   An IV tube will be put into one of your veins.  You may be given medicine to help you relax before the surgery (sedative). You will be given one of the following:  A medicine that numbs the area (local anesthesia).  A medicine that makes you go to sleep (general anesthesia).  Your health care provider will use a kind of electric scalpel  that uses heat to minimize bleeding (electrocautery knife).  A curved incision (like a smile or frown) that follows the natural curve of your breast is made, to allow for minimal scarring and better healing.  The tumor will be removed with some of the surrounding tissue. This will be sent to the lab for analysis. Your health care provider may also remove your lymph nodes at this time if needed.  Sometimes, but not always, a rubber tube called a drain will be surgically inserted into your breast area or armpit to collect excess fluid that may accumulate in the space where the tumor was. This drain is connected to a plastic bulb on the outside of your body. This drain creates suction to help remove the fluid.  The incisions will be closed with stitches (sutures).  A bandage may be placed over the incisions. AFTER THE PROCEDURE  You will be taken to the recovery area.  You will be given medicine for pain.  A small rubber drain may be placed in the breast for 2 3 days to prevent a collection of blood (hematoma) from developing in the breast. You will be given instructions on caring for the drain before you go home.  A pressure bandage (dressing) will be applied for 1 2 days to prevent bleeding. Ask your health care provider how to care for your bandage at home. Document Released: 05/14/2006 Document Revised: 12/03/2012 Document Reviewed: 09/05/2012 [employer] Patient Information 2014 [employer].  

## 2013-04-28 ENCOUNTER — Encounter (HOSPITAL_BASED_OUTPATIENT_CLINIC_OR_DEPARTMENT_OTHER): Payer: Self-pay | Admitting: *Deleted

## 2013-04-28 ENCOUNTER — Other Ambulatory Visit: Payer: Self-pay

## 2013-04-28 ENCOUNTER — Encounter (HOSPITAL_BASED_OUTPATIENT_CLINIC_OR_DEPARTMENT_OTHER)
Admission: RE | Admit: 2013-04-28 | Discharge: 2013-04-28 | Disposition: A | Discharge status: Home / Self Care | Payer: Medicare Other | Source: Ambulatory Visit | Attending: Surgery | Admitting: Surgery

## 2013-04-28 DIAGNOSIS — Z01810 Encounter for preprocedural cardiovascular examination: Secondary | ICD-10-CM | POA: Insufficient documentation

## 2013-04-28 DIAGNOSIS — Z01818 Encounter for other preprocedural examination: Secondary | ICD-10-CM | POA: Insufficient documentation

## 2013-04-28 DIAGNOSIS — Z01812 Encounter for preprocedural laboratory examination: Secondary | ICD-10-CM | POA: Insufficient documentation

## 2013-04-28 LAB — COMPREHENSIVE METABOLIC PANEL
ALBUMIN: 3.7 g/dL (ref 3.5–5.2)
ALT: 31 U/L (ref 0–35)
AST: 29 U/L (ref 0–37)
Alkaline Phosphatase: 87 U/L (ref 39–117)
BILIRUBIN TOTAL: 0.4 mg/dL (ref 0.3–1.2)
BUN: 29 mg/dL — AB (ref 6–23)
CO2: 28 mEq/L (ref 19–32)
Calcium: 9.9 mg/dL (ref 8.4–10.5)
Chloride: 101 mEq/L (ref 96–112)
Creatinine, Ser: 1.08 mg/dL (ref 0.50–1.10)
GFR calc Af Amer: 61 mL/min — ABNORMAL LOW (ref >90)
GFR calc non Af Amer: 52 mL/min — ABNORMAL LOW (ref >90)
Glucose, Bld: 99 mg/dL (ref 70–99)
Potassium: 4.8 mEq/L (ref 3.7–5.3)
Sodium: 139 mEq/L (ref 137–147)
Total Protein: 8.2 g/dL (ref 6.0–8.3)

## 2013-04-28 LAB — CBC WITH DIFFERENTIAL/PLATELET
BASOS PCT: 0 % (ref 0–1)
Basophils Absolute: 0 10*3/uL (ref 0.0–0.1)
Eosinophils Absolute: 0.2 10*3/uL (ref 0.0–0.7)
Eosinophils Relative: 3 % (ref 0–5)
HCT: 32.6 % — ABNORMAL LOW (ref 36.0–46.0)
HEMOGLOBIN: 11.2 g/dL — AB (ref 12.0–15.0)
LYMPHS PCT: 39 % (ref 12–46)
Lymphs Abs: 2.7 10*3/uL (ref 0.7–4.0)
MCH: 31 pg (ref 26.0–34.0)
MCHC: 34.4 g/dL (ref 30.0–36.0)
MCV: 90.3 fL (ref 78.0–100.0)
Monocytes Absolute: 0.5 10*3/uL (ref 0.1–1.0)
Monocytes Relative: 7 % (ref 3–12)
NEUTROS ABS: 3.4 10*3/uL (ref 1.7–7.7)
Neutrophils Relative %: 51 % (ref 43–77)
Platelets: 202 10*3/uL (ref 150–400)
RBC: 3.61 MIL/uL — AB (ref 3.87–5.11)
RDW: 14.1 % (ref 11.5–15.5)
WBC: 6.8 10*3/uL (ref 4.0–10.5)

## 2013-04-28 NOTE — Progress Notes (Signed)
Pt going out of town-came in for [facility] labs and ekg-to bring cpap and all meds DOS

## 2013-05-11 ENCOUNTER — Other Ambulatory Visit: Payer: Self-pay | Admitting: Internal Medicine

## 2013-05-12 ENCOUNTER — Encounter (HOSPITAL_BASED_OUTPATIENT_CLINIC_OR_DEPARTMENT_OTHER): Payer: Self-pay | Admitting: *Deleted

## 2013-05-12 ENCOUNTER — Ambulatory Visit (HOSPITAL_BASED_OUTPATIENT_CLINIC_OR_DEPARTMENT_OTHER)
Admission: RE | Admit: 2013-05-12 | Discharge: 2013-05-12 | Disposition: A | Discharge status: Home / Self Care | Payer: Medicare Other | Source: Ambulatory Visit | Attending: Surgery | Admitting: Surgery

## 2013-05-12 ENCOUNTER — Encounter (HOSPITAL_BASED_OUTPATIENT_CLINIC_OR_DEPARTMENT_OTHER): Payer: Medicare Other | Admitting: Anesthesiology

## 2013-05-12 ENCOUNTER — Ambulatory Visit (HOSPITAL_BASED_OUTPATIENT_CLINIC_OR_DEPARTMENT_OTHER): Payer: Medicare Other | Admitting: Anesthesiology

## 2013-05-12 ENCOUNTER — Encounter (HOSPITAL_BASED_OUTPATIENT_CLINIC_OR_DEPARTMENT_OTHER)
Admission: RE | Disposition: A | Discharge status: Home / Self Care | Payer: Self-pay | Source: Ambulatory Visit | Attending: Surgery

## 2013-05-12 ENCOUNTER — Ambulatory Visit
Admission: RE | Admit: 2013-05-12 | Discharge: 2013-05-12 | Disposition: A | Discharge status: Home / Self Care | Payer: 59 | Source: Ambulatory Visit | Attending: Surgery | Admitting: Surgery

## 2013-05-12 DIAGNOSIS — G4730 Sleep apnea, unspecified: Secondary | ICD-10-CM | POA: Insufficient documentation

## 2013-05-12 DIAGNOSIS — D249 Benign neoplasm of unspecified breast: Secondary | ICD-10-CM | POA: Insufficient documentation

## 2013-05-12 DIAGNOSIS — R920 Mammographic microcalcification found on diagnostic imaging of breast: Secondary | ICD-10-CM

## 2013-05-12 DIAGNOSIS — D242 Benign neoplasm of left breast: Secondary | ICD-10-CM

## 2013-05-12 DIAGNOSIS — E785 Hyperlipidemia, unspecified: Secondary | ICD-10-CM | POA: Insufficient documentation

## 2013-05-12 DIAGNOSIS — I10 Essential (primary) hypertension: Secondary | ICD-10-CM | POA: Insufficient documentation

## 2013-05-12 DIAGNOSIS — Z7982 Long term (current) use of aspirin: Secondary | ICD-10-CM | POA: Insufficient documentation

## 2013-05-12 HISTORY — PX: BREAST LUMPECTOMY WITH NEEDLE LOCALIZATION: SHX5759

## 2013-05-12 SURGERY — BREAST LUMPECTOMY WITH NEEDLE LOCALIZATION
Anesthesia: General | Laterality: Left

## 2013-05-12 MED ORDER — MIDAZOLAM HCL 2 MG/2ML IJ SOLN
INTRAMUSCULAR | Status: AC
  Filled 2013-05-12: qty 2

## 2013-05-12 MED ORDER — MIDAZOLAM HCL 5 MG/5ML IJ SOLN
INTRAMUSCULAR | Status: DC | PRN
  Administered 2013-05-12: 2 mg via INTRAVENOUS

## 2013-05-12 MED ORDER — SODIUM CHLORIDE 0.9 % IJ SOLN
INTRAMUSCULAR | Status: AC
  Filled 2013-05-12: qty 10

## 2013-05-12 MED ORDER — HYDROMORPHONE HCL PF 1 MG/ML IJ SOLN
INTRAMUSCULAR | Status: AC
  Filled 2013-05-12: qty 1

## 2013-05-12 MED ORDER — ONDANSETRON HCL 4 MG/2ML IJ SOLN
INTRAMUSCULAR | Status: DC | PRN
  Administered 2013-05-12: 4 mg via INTRAVENOUS

## 2013-05-12 MED ORDER — PROMETHAZINE HCL 25 MG/ML IJ SOLN
6.2500 mg | INTRAMUSCULAR | Status: DC | PRN
  Administered 2013-05-12: 6.25 mg via INTRAVENOUS

## 2013-05-12 MED ORDER — FENTANYL CITRATE 0.05 MG/ML IJ SOLN
INTRAMUSCULAR | Status: AC
  Filled 2013-05-12: qty 2

## 2013-05-12 MED ORDER — CEFAZOLIN SODIUM-DEXTROSE 2-3 GM-% IV SOLR
INTRAVENOUS | Status: AC
  Filled 2013-05-12: qty 50

## 2013-05-12 MED ORDER — ONDANSETRON HCL 4 MG/2ML IJ SOLN: 4.0000 mg | Freq: Once | INTRAMUSCULAR | Status: DC | PRN

## 2013-05-12 MED ORDER — DEXAMETHASONE SODIUM PHOSPHATE 4 MG/ML IJ SOLN
INTRAMUSCULAR | Status: DC | PRN
  Administered 2013-05-12: 10 mg via INTRAVENOUS

## 2013-05-12 MED ORDER — HYDROCODONE-ACETAMINOPHEN 5-325 MG PO TABS: 1.0000 | ORAL_TABLET | Freq: Four times a day (QID) | ORAL | Status: DC | PRN

## 2013-05-12 MED ORDER — LIDOCAINE HCL (CARDIAC) 20 MG/ML IV SOLN
INTRAVENOUS | Status: DC | PRN
  Administered 2013-05-12: 80 mg via INTRAVENOUS

## 2013-05-12 MED ORDER — PROMETHAZINE HCL 25 MG/ML IJ SOLN
INTRAMUSCULAR | Status: AC
  Filled 2013-05-12: qty 1

## 2013-05-12 MED ORDER — LACTATED RINGERS IV SOLN
INTRAVENOUS | Status: DC
  Administered 2013-05-12 (×2): via INTRAVENOUS

## 2013-05-12 MED ORDER — OXYCODONE HCL 5 MG PO TABS: 5.0000 mg | ORAL_TABLET | Freq: Once | ORAL | Status: DC | PRN

## 2013-05-12 MED ORDER — PROPOFOL 10 MG/ML IV BOLUS
INTRAVENOUS | Status: DC | PRN
  Administered 2013-05-12: 200 mg via INTRAVENOUS

## 2013-05-12 MED ORDER — FENTANYL CITRATE 0.05 MG/ML IJ SOLN: 50.0000 ug | INTRAMUSCULAR | Status: DC | PRN

## 2013-05-12 MED ORDER — BUPIVACAINE-EPINEPHRINE 0.25% -1:200000 IJ SOLN
INTRAMUSCULAR | Status: DC | PRN
  Administered 2013-05-12: 20 mL

## 2013-05-12 MED ORDER — MIDAZOLAM HCL 2 MG/2ML IJ SOLN: 1.0000 mg | INTRAMUSCULAR | Status: DC | PRN

## 2013-05-12 MED ORDER — DEXTROSE 5 % IV SOLN
3.0000 g | INTRAVENOUS | Status: AC
  Administered 2013-05-12: 3 g via INTRAVENOUS

## 2013-05-12 MED ORDER — OXYCODONE HCL 5 MG/5ML PO SOLN: 5.0000 mg | Freq: Once | ORAL | Status: DC | PRN

## 2013-05-12 MED ORDER — HYDROMORPHONE HCL PF 1 MG/ML IJ SOLN
0.2500 mg | INTRAMUSCULAR | Status: DC | PRN
  Administered 2013-05-12: 0.5 mg via INTRAVENOUS

## 2013-05-12 MED ORDER — FENTANYL CITRATE 0.05 MG/ML IJ SOLN
INTRAMUSCULAR | Status: DC | PRN
  Administered 2013-05-12: 50 ug via INTRAVENOUS

## 2013-05-12 SURGICAL SUPPLY — 55 items
ADH SKN CLS APL DERMABOND .7 (GAUZE/BANDAGES/DRESSINGS) ×1
APPLIER CLIP 11 MED OPEN (CLIP)
APR CLP MED 11 20 MLT OPN (CLIP)
BINDER BREAST LRG (GAUZE/BANDAGES/DRESSINGS) IMPLANT
BINDER BREAST MEDIUM (GAUZE/BANDAGES/DRESSINGS) IMPLANT
BINDER BREAST XLRG (GAUZE/BANDAGES/DRESSINGS) IMPLANT
BINDER BREAST XXLRG (GAUZE/BANDAGES/DRESSINGS) ×4 IMPLANT
BIOPATCH RED 1 DISK 7.0 (GAUZE/BANDAGES/DRESSINGS) IMPLANT
BIOPATCH RED 1IN DISK 7.0MM (GAUZE/BANDAGES/DRESSINGS)
BLADE SURG 15 STRL LF DISP TIS (BLADE) ×1 IMPLANT
BLADE SURG 15 STRL SS (BLADE) ×3
CANISTER SUCT 1200ML W/VALVE (MISCELLANEOUS) ×3 IMPLANT
CHLORAPREP W/TINT 26ML (MISCELLANEOUS) ×3 IMPLANT
CLIP APPLIE 11 MED OPEN (CLIP) IMPLANT
CLIP TI WIDE RED SMALL 6 (CLIP) IMPLANT
COVER MAYO STAND STRL (DRAPES) ×3 IMPLANT
COVER TABLE BACK 60X90 (DRAPES) ×3 IMPLANT
DECANTER SPIKE VIAL GLASS SM (MISCELLANEOUS) ×3 IMPLANT
DERMABOND ADVANCED (GAUZE/BANDAGES/DRESSINGS) ×2
DERMABOND ADVANCED .7 DNX12 (GAUZE/BANDAGES/DRESSINGS) ×1 IMPLANT
DEVICE DUBIN W/COMP PLATE 8390 (MISCELLANEOUS) ×2 IMPLANT
DRAIN CHANNEL 19F RND (DRAIN) IMPLANT
DRAPE LAPAROSCOPIC ABDOMINAL (DRAPES) IMPLANT
DRAPE PED LAPAROTOMY (DRAPES) ×3 IMPLANT
DRAPE UTILITY XL STRL (DRAPES) ×3 IMPLANT
ELECT COATED BLADE 2.86 ST (ELECTRODE) ×3 IMPLANT
ELECT REM PT RETURN 9FT ADLT (ELECTROSURGICAL) ×3
ELECTRODE REM PT RTRN 9FT ADLT (ELECTROSURGICAL) ×1 IMPLANT
EVACUATOR SILICONE 100CC (DRAIN) IMPLANT
GLOVE BIOGEL PI IND STRL 7.0 (GLOVE) IMPLANT
GLOVE BIOGEL PI IND STRL 8 (GLOVE) ×1 IMPLANT
GLOVE BIOGEL PI INDICATOR 7.0 (GLOVE) ×2
GLOVE BIOGEL PI INDICATOR 8 (GLOVE) ×2
GLOVE ECLIPSE 6.5 STRL STRAW (GLOVE) ×2 IMPLANT
GLOVE ECLIPSE 8.0 STRL XLNG CF (GLOVE) ×3 IMPLANT
GOWN STRL REUS W/ TWL LRG LVL3 (GOWN DISPOSABLE) ×2 IMPLANT
GOWN STRL REUS W/TWL LRG LVL3 (GOWN DISPOSABLE) ×6
KIT MARKER MARGIN INK (KITS) IMPLANT
NDL HYPO 25X1 1.5 SAFETY (NEEDLE) ×1 IMPLANT
NEEDLE HYPO 25X1 1.5 SAFETY (NEEDLE) ×3 IMPLANT
NS IRRIG 1000ML POUR BTL (IV SOLUTION) ×3 IMPLANT
PACK BASIN DAY SURGERY FS (CUSTOM PROCEDURE TRAY) ×3 IMPLANT
PENCIL BUTTON HOLSTER BLD 10FT (ELECTRODE) ×3 IMPLANT
SLEEVE SCD COMPRESS KNEE MED (MISCELLANEOUS) ×3 IMPLANT
SPONGE LAP 4X18 X RAY DECT (DISPOSABLE) ×2 IMPLANT
SUT MON AB 4-0 PC3 18 (SUTURE) ×3 IMPLANT
SUT SILK 2 0 SH (SUTURE) ×2 IMPLANT
SUT VIC AB 3-0 SH 27 (SUTURE) ×3
SUT VIC AB 3-0 SH 27X BRD (SUTURE) ×1 IMPLANT
SYR CONTROL 10ML LL (SYRINGE) ×3 IMPLANT
TOWEL OR 17X24 6PK STRL BLUE (TOWEL DISPOSABLE) ×4 IMPLANT
TOWEL OR NON WOVEN STRL DISP B (DISPOSABLE) ×3 IMPLANT
TUBE CONNECTING 20'X1/4 (TUBING) ×1
TUBE CONNECTING 20X1/4 (TUBING) ×2 IMPLANT
YANKAUER SUCT BULB TIP NO VENT (SUCTIONS) ×3 IMPLANT

## 2013-05-12 NOTE — Anesthesia Procedure Notes (Signed)
Procedure Name: LMA Insertion Date/Time: 05/12/2013 12:50 PM Performed by: Maryella Shivers Pre-anesthesia Checklist: Patient identified, Emergency Drugs available, Suction available and Patient being monitored Patient Re-evaluated:Patient Re-evaluated prior to inductionOxygen Delivery Method: Circle System Utilized Preoxygenation: Pre-oxygenation with 100% oxygen Intubation Type: IV induction Ventilation: Mask ventilation without difficulty LMA: LMA inserted LMA Size: 4.0 Number of attempts: 1 Airway Equipment and Method: bite block Placement Confirmation: positive ETCO2 Tube secured with: Tape Dental Injury: Teeth and Oropharynx as per pre-operative assessment

## 2013-05-12 NOTE — Interval H&P Note (Signed)
History and Physical Interval Note:  05/12/2013 12:28 PM  Beth Sullivan  has presented today for surgery, with the diagnosis of left breast pappiloma  The various methods of treatment have been discussed with the patient and family. After consideration of risks, benefits and other options for treatment, the patient has consented to  Procedure(s): BREAST LUMPECTOMY WITH NEEDLE LOCALIZATION (Left) as a surgical intervention .  The patient's history has been reviewed, patient examined, no change in status, stable for surgery.  I have reviewed the patient's chart and labs.  Questions were answered to the patient's satisfaction.     [name] A.

## 2013-05-12 NOTE — Op Note (Signed)
Preoperative diagnosis: left breast papilloma  Postop diagnosis: Same  Procedure: left breast lumpectomy  with wire localization  Surgeon: Erroll Luna M.D.  Anesthesia: LMA with 0.25% Sensorcaine local  EBL: Less than 40 cc  Specimen:  Left Breast mass with wire and clip verified by radiography to pathology  Drains: None  Indications for procedure: The patient presents with a breast mass. Core biopsy showed it to be consistent with papillpoma. Options of excision vs observation discussed. . The patient wants to proceed with lumpectomy  with wire localization.The procedure has been discussed with the patient. Alternatives to surgery have been discussed with the patient.  Risks of surgery include bleeding,  Infection,  Seroma formation, death,  and the need for further surgery.   The patient understands and wishes to proceed.  Description of procedure: The patient was seen in the holding area and the appropriate side was marked. Questions are answered. Wire localization was done the radiology. The patient was taken back to the operating room and placed supine on the operating room table. After induction of general anesthesia, chest and upper arm  were prepped and draped in a sterile fashion. Timeout was done and she received preoperative antibiotics. Curvilinear incision was made around the wire insertion site medially. All tissue around the wire was excised and hemostasis was achieved with cautery. The area was removed in its entirety upon gross examination. Gross margin negative. Radiograph revealed the mass, wire and clip to be in the specimen. The wound was closed in layers using 3-0 Vicryl and 4-0 Monocryl subcuticular stitch. Dermabond applied. All final counts found to be correct. Patient awoke extubated taken recovery in satisfactory condition.

## 2013-05-12 NOTE — Discharge Instructions (Signed)
[facility] °Office Phone Number [phone] ° °BREAST BIOPSY/ PARTIAL MASTECTOMY: POST OP INSTRUCTIONS ° °Always review your discharge instruction sheet given to you by the facility where your surgery was performed. ° °IF YOU HAVE DISABILITY OR FAMILY LEAVE FORMS, YOU MUST BRING THEM TO THE OFFICE FOR PROCESSING.  DO NOT GIVE THEM TO YOUR DOCTOR. ° °1. A prescription for pain medication may be given to you upon discharge.  Take your pain medication as prescribed, if needed.  If narcotic pain medicine is not needed, then you may take acetaminophen (Tylenol) or ibuprofen (Advil) as needed. °2. Take your usually prescribed medications unless otherwise directed °3. If you need a refill on your pain medication, please contact your pharmacy.  They will contact our office to request authorization.  Prescriptions will not be filled after 5pm or on week-ends. °4. You should eat very light the first 24 hours after surgery, such as soup, crackers, pudding, etc.  Resume your normal diet the day after surgery. °5. Most patients will experience some swelling and bruising in the breast.  Ice packs and a good support bra will help.  Swelling and bruising can take several days to resolve.  °6. It is common to experience some constipation if taking pain medication after surgery.  Increasing fluid intake and taking a stool softener will usually help or prevent this problem from occurring.  A mild laxative (Milk of Magnesia or Miralax) should be taken according to package directions if there are no bowel movements after 48 hours. °7. Unless discharge instructions indicate otherwise, you may remove your bandages 24-48 hours after surgery, and you may shower at that time.  You may have steri-strips (small skin tapes) in place directly over the incision.  These strips should be left on the skin for 7-10 days.  If your surgeon used skin glue on the incision, you may shower in 24 hours.  The glue will flake off over the  next 2-3 weeks.  Any sutures or staples will be removed at the office during your follow-up visit. °8. ACTIVITIES:  You may resume regular daily activities (gradually increasing) beginning the next day.  Wearing a good support bra or sports bra minimizes pain and swelling.  You may have sexual intercourse when it is comfortable. °a. You may drive when you no longer are taking prescription pain medication, you can comfortably wear a seatbelt, and you can safely maneuver your car and apply brakes. °b. RETURN TO WORK:  ______________________________________________________________________________________ °9. You should see your doctor in the office for a follow-up appointment approximately two weeks after your surgery.  Your doctor’s nurse will typically make your follow-up appointment when she calls you with your pathology report.  Expect your pathology report 2-3 business days after your surgery.  You may call to check if you do not hear from us after three days. °10. OTHER INSTRUCTIONS: _______________________________________________________________________________________________ _____________________________________________________________________________________________________________________________________ °_____________________________________________________________________________________________________________________________________ °_____________________________________________________________________________________________________________________________________ ° °WHEN TO CALL YOUR DOCTOR: °1. Fever over 101.0 °2. Nausea and/or vomiting. °3. Extreme swelling or bruising. °4. Continued bleeding from incision. °5. Increased pain, redness, or drainage from the incision. ° °The clinic staff is available to answer your questions during regular business hours.  Please don’t hesitate to call and ask to speak to one of the nurses for clinical concerns.  If you have a medical emergency, go to the nearest  emergency room or call 911.  A surgeon from [facility] is always on call at the hospital. ° °For further questions, please visit [email or web address]  ° ° °  Post Anesthesia Home Care Instructions ° °Activity: °Get plenty of rest for the remainder of the day. A responsible adult should stay with you for 24 hours following the procedure.  °For the next 24 hours, DO NOT: °-Drive a car °-Operate machinery °-Drink alcoholic beverages °-Take any medication unless instructed by your physician °-Make any legal decisions or sign important papers. ° °Meals: °Start with liquid foods such as gelatin or soup. Progress to regular foods as tolerated. Avoid greasy, spicy, heavy foods. If nausea and/or vomiting occur, drink only clear liquids until the nausea and/or vomiting subsides. Call your physician if vomiting continues. ° °Special Instructions/Symptoms: °Your throat may feel dry or sore from the anesthesia or the breathing tube placed in your throat during surgery. If this causes discomfort, gargle with warm salt water. The discomfort should disappear within 24 hours. ° °

## 2013-05-12 NOTE — H&P (View-Only) (Signed)
Patient ID: Beth Sullivan, female   DOB: Feb 10, 1946, 68 y.o.   MRN: [id number]  No chief complaint on file.   HPI Beth Sullivan is a 68 y.o. female.  Patient sent at the request of Dr. Leighton Ruff for left breast nipple discharge x4 months. Discharge is clear in nature. It is intermittent. It is minimal in amount. Left breast mammogram, MRI and ultrasound of the done and prominent central dilated left breast duct identified and core biopsy done. It shows sclerosing papilloma. Patient sent at the request of Dr. Drema Dallas for this. Patient denies any new breast masses, pain or any bloody discharge. HPI  Past Medical History  Diagnosis Date  . Hypertension   . Hyperlipemia   . Allergic rhinitis   . Chronic headaches   . Sleep apnea     Past Surgical History  Procedure Laterality Date  . Vesicovaginal fistula closure w/ tah    . Foot surgery  08-2011    Left    Family History  Problem Relation Age of Onset  . Heart disease Mother   . Heart disease Brother     Social History History  Substance Use Topics  . Smoking status: Never Smoker   . Smokeless tobacco: Not on file  . Alcohol Use: No    No Known Allergies  Current Outpatient Prescriptions  Medication Sig Dispense Refill  . Ascorbic Acid (VITAMIN C) 1000 MG tablet Take 1,000 mg by mouth daily.      Marland Kitchen aspirin 81 MG tablet Take 81 mg by mouth daily.      Marland Kitchen atorvastatin (LIPITOR) 10 MG tablet Take 10 mg by mouth daily.      . Calcium Carbonate-Vitamin D (CALCIUM + D PO) Take 1 tablet by mouth daily.       . chlorthalidone (HYGROTON) 25 MG tablet Take 25 mg by mouth daily.      . cloNIDine (CATAPRES - DOSED IN MG/24 HR) 0.1 mg/24hr patch Place 1 patch onto the skin once a week.      Marland Kitchen ibuprofen (ADVIL,MOTRIN) 800 MG tablet Take as directed as needed      . Multiple Vitamin (MULTIVITAMIN) tablet Take 1 tablet by mouth daily.      . Omega-3 Fatty Acids (FISH OIL) 1200 MG CAPS Take 2 capsules by mouth daily.      Marland Kitchen  spironolactone (ALDACTONE) 25 MG tablet Take 25 mg by mouth daily.      . valACYclovir (VALTREX) 1000 MG tablet Take 1,000 mg by mouth 2 (two) times daily. Prn cold sores      . valsartan (DIOVAN) 320 MG tablet Take 320 mg by mouth daily.      . verapamil (COVERA HS) 240 MG (CO) 24 hr tablet Take 240 mg by mouth 2 (two) times daily.        No current facility-administered medications for this visit.    Review of Systems Review of Systems  Constitutional: Negative for fever, chills and unexpected weight change.  HENT: Negative for congestion, hearing loss, sore throat, trouble swallowing and voice change.   Eyes: Negative for visual disturbance.  Respiratory: Negative for cough and wheezing.   Cardiovascular: Negative for chest pain, palpitations and leg swelling.  Gastrointestinal: Negative for nausea, vomiting, abdominal pain, diarrhea, constipation, blood in stool, abdominal distention and anal bleeding.  Genitourinary: Negative for hematuria, vaginal bleeding and difficulty urinating.  Musculoskeletal: Negative for arthralgias.  Skin: Negative for rash and wound.  Neurological: Negative for seizures, syncope and headaches.  Hematological: Negative for adenopathy. Does not bruise/bleed easily.  Psychiatric/Behavioral: Negative for confusion.    Blood pressure 122/80, pulse 80, temperature 97.6 F (36.4 C), resp. rate 14, height 5\' 2"  (1.575 m), weight 268 lb (121.564 kg).  Physical Exam Physical Exam  Constitutional: She is oriented to person, place, and time. She appears well-developed and well-nourished.  HENT:  Head: Normocephalic and atraumatic.  Eyes: Pupils are equal, round, and reactive to light. No scleral icterus.  Neck: Normal range of motion.  Cardiovascular: Normal rate and regular rhythm.   Pulmonary/Chest: Effort normal and breath sounds normal. Right breast exhibits no inverted nipple, no mass, no nipple discharge, no skin change and no tenderness. Left breast  exhibits nipple discharge. Left breast exhibits no inverted nipple, no mass, no skin change and no tenderness. Breasts are symmetrical.  Musculoskeletal: Normal range of motion.  Neurological: She is alert and oriented to person, place, and time.  Skin: Skin is warm and dry.  Psychiatric: She has a normal mood and affect. Her behavior is normal. Judgment and thought content normal.    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 Small risk of malignancy associated with this condition of 0-4%. Risks, benefits and alternatives to surgery discussed. Patient would like to proceed with left breast needle localized lumpectomy.The procedure has been discussed with the patient. Alternatives to surgery have been discussed with the patient.  Risks of surgery include bleeding,  Infection,  Seroma formation, death,  and the need for further surgery.   The patient understands and wishes to proceed.       Garyson Stelly A. 04/20/2013, 12:31 PM

## 2013-05-12 NOTE — Anesthesia Postprocedure Evaluation (Signed)
  Anesthesia Post-op Note  Patient: Beth Sullivan  Procedure(s) Performed: Procedure(s): BREAST LUMPECTOMY WITH NEEDLE LOCALIZATION (Left)  Patient Location: PACU  Anesthesia Type:General  Level of Consciousness: awake  Airway and Oxygen Therapy: Patient Spontanous Breathing  Post-op Pain: mild  Post-op Assessment: Post-op Vital signs reviewed, Patient's Cardiovascular Status Stable, Respiratory Function Stable, Patent Airway, No signs of Nausea or vomiting and Pain level controlled  Post-op Vital Signs: Reviewed and stable  Complications: No apparent anesthesia complications

## 2013-05-12 NOTE — Transfer of Care (Signed)
Immediate Anesthesia Transfer of Care Note  Patient: Beth Sullivan  Procedure(s) Performed: Procedure(s): BREAST LUMPECTOMY WITH NEEDLE LOCALIZATION (Left)  Patient Location: PACU  Anesthesia Type:General  Level of Consciousness: awake, alert  and oriented  Airway & Oxygen Therapy: Patient Spontanous Breathing and Patient connected to face mask oxygen  Post-op Assessment: Report given to PACU RN and Post -op Vital signs reviewed and stable  Post vital signs: Reviewed and stable  Complications: No apparent anesthesia complications

## 2013-05-12 NOTE — Anesthesia Preprocedure Evaluation (Signed)
Anesthesia Evaluation  Patient identified by MRN, date of birth, ID band Patient awake    Reviewed: Allergy & Precautions, H&P , NPO status , Patient's Chart, lab work & pertinent test results  Airway Mallampati: I TM Distance: >3 FB Neck ROM: Full    Dental  (+) Teeth Intact and Dental Advisory Given   Pulmonary sleep apnea and Continuous Positive Airway Pressure Ventilation ,  breath sounds clear to auscultation        Cardiovascular hypertension, Pt. on medications Rhythm:Regular Rate:Normal     Neuro/Psych    GI/Hepatic   Endo/Other    Renal/GU      Musculoskeletal   Abdominal   Peds  Hematology   Anesthesia Other Findings   Reproductive/Obstetrics                           Anesthesia Physical Anesthesia Plan  ASA: III  Anesthesia Plan: General   Post-op Pain Management:    Induction: Intravenous  Airway Management Planned: LMA  Additional Equipment:   Intra-op Plan:   Post-operative Plan: Extubation in OR  Informed Consent: I have reviewed the patients History and Physical, chart, labs and discussed the procedure including the risks, benefits and alternatives for the proposed anesthesia with the patient or authorized representative who has indicated his/her understanding and acceptance.   Dental advisory given  Plan Discussed with: CRNA, Anesthesiologist and Surgeon  Anesthesia Plan Comments:         Anesthesia Quick Evaluation

## 2013-05-14 ENCOUNTER — Encounter (HOSPITAL_BASED_OUTPATIENT_CLINIC_OR_DEPARTMENT_OTHER): Payer: Self-pay | Admitting: Surgery

## 2013-05-15 ENCOUNTER — Telehealth (INDEPENDENT_AMBULATORY_CARE_PROVIDER_SITE_OTHER): Payer: Self-pay

## 2013-05-15 NOTE — Telephone Encounter (Signed)
Called pt with benign path results.  

## 2013-05-15 NOTE — Telephone Encounter (Signed)
Message copied by Carlene Coria on Fri May 15, 2013  3:21 PM ------      Message from: Erroll Luna A      Created: Fri May 15, 2013  2:05 PM       [id number] ------

## 2013-05-18 ENCOUNTER — Telehealth (INDEPENDENT_AMBULATORY_CARE_PROVIDER_SITE_OTHER): Payer: Self-pay

## 2013-05-18 NOTE — Telephone Encounter (Signed)
Patient states her arm is swollen and red where the IV was. I advised her to apply heat to the area, but be sure it is not to hot, and elevate her arm on pillows.  Patient verbalized understanding

## 2013-05-19 NOTE — Telephone Encounter (Signed)
Patient called back and was given below message.  Patient states understanding and agreeable at this time.

## 2013-05-19 NOTE — Telephone Encounter (Signed)
Take aspirin 325 mg a day.  If no better in next day or two needs to be seen.

## 2013-05-25 ENCOUNTER — Ambulatory Visit (INDEPENDENT_AMBULATORY_CARE_PROVIDER_SITE_OTHER): Payer: Medicare Other | Admitting: Surgery

## 2013-05-25 ENCOUNTER — Encounter (INDEPENDENT_AMBULATORY_CARE_PROVIDER_SITE_OTHER): Payer: Self-pay | Admitting: Surgery

## 2013-05-25 VITALS — BP 128/74 | HR 77 | Temp 98.0°F | Resp 18 | Ht 63.0 in | Wt 266.0 lb

## 2013-05-25 DIAGNOSIS — Z9889 Other specified postprocedural states: Secondary | ICD-10-CM

## 2013-05-25 NOTE — Progress Notes (Signed)
Patient returns 12 days after left breast needle localized lumpectomy. She is doing well.  Exam: Left breast incision clean dry and intact. No signs of drainage or infection.  Pathology:Breast, lumpectomy, Left - SCLEROSED INTRADUCTAL PAPILLOMA WITH CALCIFICATIONS, SEE COMMENT. - NEGATIVE FOR ATYPIA OR MALIGNANCY.   Impression: Status post left breast needle localized lumpectomy for papilloma  Plan: No further followup needed. Resume yearly mammogram next year. Patient expressed interest in bariatric surgery. I directed her to the front desk they can give her information about the workshop.

## 2013-05-25 NOTE — Patient Instructions (Signed)
Resume water classes in 2 weeks.

## 2013-09-21 ENCOUNTER — Encounter: Payer: Self-pay | Admitting: Internal Medicine

## 2013-09-21 ENCOUNTER — Ambulatory Visit (INDEPENDENT_AMBULATORY_CARE_PROVIDER_SITE_OTHER): Payer: Medicare Other | Admitting: Internal Medicine

## 2013-09-21 VITALS — BP 128/60 | HR 67 | Ht 62.0 in | Wt 267.0 lb

## 2013-09-21 DIAGNOSIS — G4733 Obstructive sleep apnea (adult) (pediatric): Secondary | ICD-10-CM

## 2013-09-21 DIAGNOSIS — J209 Acute bronchitis, unspecified: Secondary | ICD-10-CM

## 2013-09-21 MED ORDER — ZOLPIDEM TARTRATE 5 MG PO TABS: 5.0000 mg | ORAL_TABLET | Freq: Every evening | ORAL | Status: AC | PRN

## 2013-09-21 NOTE — Patient Instructions (Addendum)
Order- pressure compliance report from [facility]       Dx OSA  Script for ambien 5 mg to take for sleep if needed  Please call as needed

## 2013-09-21 NOTE — Progress Notes (Signed)
05/21/12- 68 yo F seen for sleep medicine evaluation at kind request of Dr Leighton Ruff. Hx sleep study over 10 years ago (pt states seen by [facility] years ago) Remote sleep study dx'd OSA but she disliked CPAP on her face and was lost to follow-up.  Now c/o snoring that wakes her, sleepy if quiet, including at least once while driving. Bedtime 8:00-9:00 PM, latency 30-45 min, waking 6-7 x before up 7:30-8:00 AM. Weight has drifted up. No ENT surgery, or hx heart, lung or thyroid disease. Treated for HBP Lives w/ husband, retired. Mother died heart disease, no FHX or OSA.   07/21/12- 23 yo F seen for sleep medicine evaluation at kind request of Dr Leighton Ruff. Hx sleep study over 10 years ago (pt states seen by [facility] years ago) FOLLOWS FOR: review new Sleep Study results with patient. Describes occasional sleep paralysis or difficulty waking from dream. NPSG 05/23/12- AHI 42.6 per hour. Weight 267 pounds We discussed the medical significance and treatment options, responsibility to drive safely and the importance of her weight.  09/09/12- 13 yo F seen for OSA      PCP Dr Leighton Ruff. FOLLOWS FOR: Wears CPAP autoPap/ [facility] every night for about 6-8 hours; pressure working well for patient. Some days residual sleepiness. Recognizes she is better off with CPAP. Sometimes insomnia/busy brain. Download confirms good compliance and fair control. Fixed CPAP pressure would be 12. Had a flu syndrome with productive cough. Still coughing some clear or yellow sputum.  03/23/13- 2 yo F seen for OSA      PCP Dr Leighton Ruff. FOLLOWS FOR:  Wearing CPAP 12/ [facility]  5-8 hours per night.  No complaints Likes this machine much better and feels pressure is right. Nasal pillows. Discussed humidifier. Getting over incidental URI now. She resolved bronchitis treated last Spring. Post viral bronchitis symptoms tend to linger for her. CXR 09/09/12 CHEST - 2 VIEW  Comparison: 11/13/2011.  Findings: Trachea is midline.  Heart size normal. Lungs are low in  volume but clear. No pleural fluid.  IMPRESSION:  No acute findings.  Original Report Authenticated By: Lorin Picket, M.D.   09/21/13- 78 yo F never smoker seen for OSA      PCP Dr Leighton Ruff. FOLLOWS FOR: wears CPAP 12 every night for about 5-8 hours; DME is [facility] Wakes frequently during the night, not sure why but goes right back to sleep. Bedtime 9:30 PM of 7:30 AM. We discussed appropriate length of sleep and sleep hygiene. Bronchitis complaints have resolved and she does not feel need for rescue inhaler  ROS-see HPI Constitutional:   No-   weight loss, night sweats, fevers, chills, fatigue, lassitude. HEENT:   No-  headaches, difficulty swallowing, tooth/dental problems, sore throat,       No-  sneezing, itching, ear ache, +nasal congestion, post nasal drip,  CV:  No-   chest pain, orthopnea, PND, swelling in lower extremities, anasarca, dizziness, palpitations Resp: +shortness of breath with exertion or at rest.       No-   productive cough,  No non-productive cough,  No- coughing up of blood.              No-   change in color of mucus.  No- wheezing.   Skin: No-   rash or lesions. GI:  No-   heartburn, indigestion, abdominal pain, nausea, vomiting,  GU:  MS:  No-   joint pain or swelling.   Neuro-     nothing unusual  Psych:  No- change in mood or affect. No depression or anxiety.  No memory loss.  OBJ- Physical Exam General- Alert, Oriented, Affect-appropriate, Distress- none acute. Overweight. Skin- rash-none, lesions- none, excoriation- none Lymphadenopathy- none Head- atraumatic            Eyes- Gross vision intact, PERRLA, conjunctivae and secretions clear            Ears- Hearing, canals-normal            Nose- Clear, no-Septal dev, mucus, polyps, erosion, perforation             Throat- Mallampati II , mucosa clear , drainage- none, tonsils- atrophic Neck- flexible , trachea midline, no stridor , thyroid nl, carotid no  bruit Chest - symmetrical excursion , unlabored           Heart/CV- RRR , no murmur , no gallop  , no rub, nl s1 s2                           - JVD- none , edema- none, stasis changes- none, varices- none           Lung- clear to P&A, wheeze- none, cough-none, dullness-none, rub- none           Chest wall-  Abd-  Br/ Gen/ Rectal- Not done, not indicated Extrem- cyanosis- none, clubbing, none, atrophy- none, strength- nl Neuro- grossly intact to observation

## 2013-11-21 NOTE — Assessment & Plan Note (Signed)
Nonspecific insomnia component may be related to management of her sleep apnea. We discussed good sleep habits. Plan-add Ambien 5 mg for trial. Continues CPAP 12

## 2013-11-21 NOTE — Assessment & Plan Note (Signed)
Controlled/resolved

## 2014-01-28 ENCOUNTER — Other Ambulatory Visit: Payer: Self-pay

## 2014-01-28 DIAGNOSIS — Z1239 Encounter for other screening for malignant neoplasm of breast: Secondary | ICD-10-CM

## 2014-02-14 DIAGNOSIS — Z860100 Personal history of colon polyps, unspecified: Secondary | ICD-10-CM

## 2014-02-14 HISTORY — DX: Personal history of colonic polyps: Z86.010

## 2014-02-14 HISTORY — DX: Personal history of colon polyps, unspecified: Z86.0100

## 2014-02-17 ENCOUNTER — Ambulatory Visit
Admission: RE | Admit: 2014-02-17 | Discharge: 2014-02-17 | Disposition: A | Discharge status: Home / Self Care | Payer: 59 | Source: Ambulatory Visit

## 2014-02-17 ENCOUNTER — Other Ambulatory Visit: Payer: Self-pay

## 2014-02-17 DIAGNOSIS — Z1231 Encounter for screening mammogram for malignant neoplasm of breast: Secondary | ICD-10-CM

## 2014-03-24 ENCOUNTER — Other Ambulatory Visit: Payer: Self-pay | Admitting: Gastroenterology

## 2014-09-22 ENCOUNTER — Ambulatory Visit: Payer: Medicare Other | Admitting: Internal Medicine

## 2014-10-11 ENCOUNTER — Other Ambulatory Visit: Payer: Self-pay

## 2014-11-19 ENCOUNTER — Encounter: Payer: Self-pay | Admitting: Internal Medicine

## 2014-11-19 ENCOUNTER — Ambulatory Visit (INDEPENDENT_AMBULATORY_CARE_PROVIDER_SITE_OTHER): Payer: Medicare Other | Admitting: Internal Medicine

## 2014-11-19 VITALS — BP 118/76 | HR 65 | Ht 62.0 in | Wt 236.6 lb

## 2014-11-19 DIAGNOSIS — G4733 Obstructive sleep apnea (adult) (pediatric): Secondary | ICD-10-CM | POA: Diagnosis not present

## 2014-11-19 NOTE — Progress Notes (Signed)
05/21/12- 69 yo F seen for sleep medicine evaluation at kind request of Dr Leighton Ruff. Hx sleep study over 10 years ago (pt states seen by [facility] years ago) Remote sleep study dx'd OSA but she disliked CPAP on her face and was lost to follow-up.  Now c/o snoring that wakes her, sleepy if quiet, including at least once while driving. Bedtime 8:00-9:00 PM, latency 30-45 min, waking 6-7 x before up 7:30-8:00 AM. Weight has drifted up. No ENT surgery, or hx heart, lung or thyroid disease. Treated for HBP Lives w/ husband, retired. Mother died heart disease, no FHX or OSA.   07/21/12- 60 yo F seen for sleep medicine evaluation at kind request of Dr Leighton Ruff. Hx sleep study over 10 years ago (pt states seen by [facility] years ago) FOLLOWS FOR: review new Sleep Study results with patient. Describes occasional sleep paralysis or difficulty waking from dream. NPSG 05/23/12- AHI 42.6 per hour. Weight 267 pounds We discussed the medical significance and treatment options, responsibility to drive safely and the importance of her weight.  09/09/12- 72 yo F seen for OSA      PCP Dr Leighton Ruff. FOLLOWS FOR: Wears CPAP autoPap/ [facility] every night for about 6-8 hours; pressure working well for patient. Some days residual sleepiness. Recognizes she is better off with CPAP. Sometimes insomnia/busy brain. Download confirms good compliance and fair control. Fixed CPAP pressure would be 12. Had a flu syndrome with productive cough. Still coughing some clear or yellow sputum.  03/23/13- 66 yo F seen for OSA      PCP Dr Leighton Ruff. FOLLOWS FOR:  Wearing CPAP 12/ [facility]  5-8 hours per night.  No complaints Likes this machine much better and feels pressure is right. Nasal pillows. Discussed humidifier. Getting over incidental URI now. She resolved bronchitis treated last Spring. Post viral bronchitis symptoms tend to linger for her. CXR 09/09/12 CHEST - 2 VIEW  Comparison: 11/13/2011.  Findings: Trachea is midline.  Heart size normal. Lungs are low in  volume but clear. No pleural fluid.  IMPRESSION:  No acute findings.  Original Report Authenticated By: Lorin Picket, M.D.   09/21/13- 49 yo F never smoker seen for OSA      PCP Dr Leighton Ruff. FOLLOWS FOR: wears CPAP 12 every night for about 5-8 hours; DME is [facility] Wakes frequently during the night, not sure why but goes right back to sleep. Bedtime 9:30 PM of 7:30 AM. We discussed appropriate length of sleep and sleep hygiene. Bronchitis complaints have resolved and she does not feel need for rescue inhaler  11/19/14-  11 yo F never smoker seen for OSA      PCP Dr Leighton Ruff. FOLLOWS FOR: Wears CPAP 12 every night for about 5-7 hours; DME is [facility]. Pressure working well for patient; no supplies needed at this.  Using nasal pillows mask. She is quite satisfied with CPAP and recognizes it helps her. Much less daytime sleepiness.  ROS-see HPI Constitutional:   No-   weight loss, night sweats, fevers, chills, fatigue, lassitude. HEENT:   No-  headaches, difficulty swallowing, tooth/dental problems, sore throat,       No-  sneezing, itching, ear ache, +nasal congestion, post nasal drip,  CV:  No-   chest pain, orthopnea, PND, swelling in lower extremities, anasarca, dizziness, palpitations Resp: +shortness of breath with exertion or at rest.       No-   productive cough,  No non-productive cough,  No- coughing up of blood.  No-   change in color of mucus.  No- wheezing.   Skin: No-   rash or lesions. GI:  No-   heartburn, indigestion, abdominal pain, nausea, vomiting,  GU:  MS:  No-   joint pain or swelling.   Neuro-     nothing unusual Psych:  No- change in mood or affect. No depression or anxiety.  No memory loss.  OBJ- Physical Exam General- Alert, Oriented, Affect-appropriate, Distress- none acute. Overweight. Skin- rash-none, lesions- none, excoriation- none Lymphadenopathy- none Head- atraumatic            Eyes- Gross vision  intact, PERRLA, conjunctivae and secretions clear            Ears- Hearing, canals-normal            Nose- Clear, no-Septal dev, mucus, polyps, erosion, perforation             Throat- Mallampati II-III , mucosa clear , drainage- none, tonsils- atrophic Neck- flexible , trachea midline, no stridor , thyroid nl, carotid no bruit Chest - symmetrical excursion , unlabored           Heart/CV- RRR , no murmur , no gallop  , no rub, nl s1 s2                           - JVD- none , edema- none, stasis changes- none, varices- none           Lung- clear to P&A, wheeze- none, cough-none, dullness-none, rub- none           Chest wall-  Abd-  Br/ Gen/ Rectal- Not done, not indicated Extrem- cyanosis- none, clubbing, none, atrophy- none, strength- nl Neuro- grossly intact to observation

## 2014-11-19 NOTE — Patient Instructions (Signed)
We can continue CPAP 12/ [facility]  Order- download  [facility] for pressure compliance    Dx OSA  Try otc Biotene mouth rinse at bedtime for dry mouth

## 2014-11-20 NOTE — Assessment & Plan Note (Signed)
CPAP has definitely helped her and her compliance and control have been good. Plan-schedule download for documentation.

## 2014-12-08 ENCOUNTER — Telehealth: Payer: Self-pay | Admitting: Internal Medicine

## 2014-12-08 DIAGNOSIS — G4733 Obstructive sleep apnea (adult) (pediatric): Secondary | ICD-10-CM

## 2014-12-08 NOTE — Telephone Encounter (Signed)
Called and advised patient that we received her download and we will call her back once Dr. Annamaria Boots has reviewed it To Dr. Annamaria Boots, download on your cart

## 2014-12-09 NOTE — Telephone Encounter (Signed)
Called, spoke with pt.  Discussed below per Dr. Annamaria Boots.  Pt verbalized understanding and would like to proceed with having pressure increased.  Order sent.  Pt aware [facility] will be contacting her to change this.  She is to call back if problems with new setting.  Pt verbalized understanding, is in agreement with plan, and voiced no further questions or concerns at this time.

## 2014-12-09 NOTE — Telephone Encounter (Signed)
Download confirms she is using it great. Now set on 12, there are a few more break-through events than we like.  If ok with her, I recommend Order- DME increase CPAP from 12 to 14 cwp.   Dx OSA

## 2015-01-17 ENCOUNTER — Other Ambulatory Visit: Payer: Self-pay

## 2015-01-17 DIAGNOSIS — Z1231 Encounter for screening mammogram for malignant neoplasm of breast: Secondary | ICD-10-CM

## 2015-02-21 ENCOUNTER — Ambulatory Visit: Payer: Medicare Other

## 2015-03-02 ENCOUNTER — Ambulatory Visit
Admission: RE | Admit: 2015-03-02 | Discharge: 2015-03-02 | Disposition: A | Discharge status: Home / Self Care | Payer: Medicare Other | Source: Ambulatory Visit

## 2015-03-02 DIAGNOSIS — Z1231 Encounter for screening mammogram for malignant neoplasm of breast: Secondary | ICD-10-CM

## 2015-09-28 ENCOUNTER — Other Ambulatory Visit: Payer: Self-pay | Admitting: Family Medicine

## 2015-09-28 DIAGNOSIS — E2839 Other primary ovarian failure: Secondary | ICD-10-CM

## 2015-10-14 ENCOUNTER — Ambulatory Visit
Admission: RE | Admit: 2015-10-14 | Discharge: 2015-10-14 | Disposition: A | Discharge status: Home / Self Care | Payer: Medicare Other | Source: Ambulatory Visit | Attending: Family Medicine | Admitting: Family Medicine

## 2015-10-14 DIAGNOSIS — E2839 Other primary ovarian failure: Secondary | ICD-10-CM

## 2016-02-28 ENCOUNTER — Other Ambulatory Visit: Payer: Self-pay | Admitting: Family Medicine

## 2016-02-28 DIAGNOSIS — Z1231 Encounter for screening mammogram for malignant neoplasm of breast: Secondary | ICD-10-CM

## 2016-03-30 ENCOUNTER — Encounter: Payer: Self-pay | Admitting: Cardiology

## 2016-04-03 ENCOUNTER — Ambulatory Visit
Admission: RE | Admit: 2016-04-03 | Discharge: 2016-04-03 | Disposition: A | Discharge status: Home / Self Care | Payer: Medicare Other | Source: Ambulatory Visit | Attending: Family Medicine | Admitting: Family Medicine

## 2016-04-03 DIAGNOSIS — Z1231 Encounter for screening mammogram for malignant neoplasm of breast: Secondary | ICD-10-CM

## 2016-04-18 ENCOUNTER — Encounter: Payer: Self-pay | Admitting: Internal Medicine

## 2016-05-07 ENCOUNTER — Encounter: Payer: Self-pay | Admitting: Internal Medicine

## 2016-05-07 ENCOUNTER — Ambulatory Visit (INDEPENDENT_AMBULATORY_CARE_PROVIDER_SITE_OTHER): Payer: Medicare Other | Admitting: Internal Medicine

## 2016-05-07 VITALS — BP 126/80 | HR 66 | Ht 62.0 in | Wt 257.2 lb

## 2016-05-07 DIAGNOSIS — G4733 Obstructive sleep apnea (adult) (pediatric): Secondary | ICD-10-CM

## 2016-05-07 DIAGNOSIS — G4700 Insomnia, unspecified: Secondary | ICD-10-CM

## 2016-05-07 NOTE — Patient Instructions (Signed)
Order- DME [facility]-  Please reduce CPAP to 13     Continue mask of chice, humidifier, supplies, AirView     Dx OSA  You could try otc sleep aids if needed, as discussed- melatonin 3-6 mg about 30 minutes before bedtime  Please call as needed

## 2016-05-07 NOTE — Progress Notes (Signed)
HPI F never smoker seen for OSA. NPSG 05/23/12- AHI 42.6 per hour. Weight 267 pounds . --------------------------------------------------------------------  09/21/13- 71 yo F never smoker seen for OSA      PCP Dr Leighton Ruff. FOLLOWS FOR: wears CPAP 12 every night for about 5-8 hours; DME is [facility] Wakes frequently during the night, not sure why but goes right back to sleep. Bedtime 9:30 PM of 7:30 AM. We discussed appropriate length of sleep and sleep hygiene. Bronchitis complaints have resolved and she does not feel need for rescue inhaler  11/19/14-  89 yo F never smoker seen for OSA      PCP Dr Leighton Ruff. FOLLOWS FOR: Wears CPAP 12 every night for about 5-7 hours; DME is [facility]. Pressure working well for patient; no supplies needed at this.  Using nasal pillows mask. She is quite satisfied with CPAP and recognizes it helps her. Much less daytime sleepiness.  05/07/2016-71 year old female never smoker followed for OSA, Insomnia, complicated by morbid obesity CPAP 14/[facility] > 13 today FOLLOWS FOR:[facility]. DL attached. Pt wears CPAP most every night-occasionally forgets when staying with her grandchild. Needs new order for supplies. Nasal pillows mask leaks and she adjusted several times a night. She feels pressure is comfortable. Has been leaving CPAP at home which she goes to care for her grandchild overnight. Denies daytime sleepiness "too much to do". Takes hydrocodone most mornings for somatic pains.  ROS-see HPI Constitutional:   No-   weight loss, night sweats, fevers, chills, fatigue, lassitude. HEENT:   No-  headaches, difficulty swallowing, tooth/dental problems, sore throat,       No-  sneezing, itching, ear ache, +nasal congestion, post nasal drip,  CV:  No-   chest pain, orthopnea, PND, swelling in lower extremities, anasarca, dizziness, palpitations Resp: +shortness of breath with exertion or at rest.       No-   productive cough,  No non-productive cough,  No- coughing up of blood.               No-   change in color of mucus.  No- wheezing.   Skin: No-   rash or lesions. GI:  No-   heartburn, indigestion, abdominal pain, nausea, vomiting,  GU:  MS:  +  joint pain or swelling.   Neuro-     nothing unusual Psych:  No- change in mood or affect. No depression or anxiety.  No memory loss.  OBJ- Physical Exam General- Alert, Oriented, Affect-appropriate, Distress- none acute. + Obese Skin- rash-none, lesions- none, excoriation- none Lymphadenopathy- none Head- atraumatic            Eyes- Gross vision intact, PERRLA, conjunctivae and secretions clear            Ears- Hearing, canals-normal            Nose- Clear, no-Septal dev, mucus, polyps, erosion, perforation             Throat- Mallampati II-III , mucosa clear , drainage- none, tonsils- atrophic Neck- flexible , trachea midline, no stridor , thyroid nl, carotid no bruit Chest - symmetrical excursion , unlabored           Heart/CV- RRR , no murmur , no gallop  , no rub, nl s1 s2                           - JVD- none , edema- none, stasis changes- none, varices- none  Lung- clear to P&A, wheeze- none, cough-none, dullness-none, rub- none           Chest wall-  Abd-  Br/ Gen/ Rectal- Not done, not indicated Extrem- cyanosis- none, clubbing, none, atrophy- none, strength- nl Neuro- grossly intact to observation

## 2016-05-13 DIAGNOSIS — G4700 Insomnia, unspecified: Secondary | ICD-10-CM | POA: Insufficient documentation

## 2016-05-13 NOTE — Assessment & Plan Note (Signed)
We discussed basics of good sleep hygiene and the various disturbances that she encounters. Discussed OTC conservative medication support if needed.

## 2016-05-13 NOTE — Assessment & Plan Note (Addendum)
When she wears her CPAP it does well and pressure is appropriate. I discussed her pattern of leaving it at home when she goes to attend to family member and she indicates she can probably take it with her. We are going to try reducing pressure to 13 to minimize leak of her nasal pillows mask

## 2016-08-17 ENCOUNTER — Emergency Department (HOSPITAL_COMMUNITY)
Admission: EM | Admit: 2016-08-17 | Discharge: 2016-08-17 | Disposition: A | Discharge status: Home / Self Care | Payer: Medicare Other | Attending: Emergency Medicine | Admitting: Emergency Medicine

## 2016-08-17 ENCOUNTER — Other Ambulatory Visit: Payer: Self-pay

## 2016-08-17 ENCOUNTER — Emergency Department (HOSPITAL_COMMUNITY): Payer: Medicare Other

## 2016-08-17 ENCOUNTER — Encounter (HOSPITAL_COMMUNITY): Payer: Self-pay

## 2016-08-17 DIAGNOSIS — Z79899 Other long term (current) drug therapy: Secondary | ICD-10-CM | POA: Diagnosis not present

## 2016-08-17 DIAGNOSIS — R079 Chest pain, unspecified: Secondary | ICD-10-CM | POA: Diagnosis present

## 2016-08-17 DIAGNOSIS — Z7982 Long term (current) use of aspirin: Secondary | ICD-10-CM | POA: Diagnosis not present

## 2016-08-17 DIAGNOSIS — I10 Essential (primary) hypertension: Secondary | ICD-10-CM | POA: Insufficient documentation

## 2016-08-17 DIAGNOSIS — R0789 Other chest pain: Secondary | ICD-10-CM | POA: Diagnosis not present

## 2016-08-17 LAB — CBC
HCT: 34.2 % — ABNORMAL LOW (ref 36.0–46.0)
Hemoglobin: 11.1 g/dL — ABNORMAL LOW (ref 12.0–15.0)
MCH: 30.2 pg (ref 26.0–34.0)
MCHC: 32.5 g/dL (ref 30.0–36.0)
MCV: 93.2 fL (ref 78.0–100.0)
PLATELETS: 203 10*3/uL (ref 150–400)
RBC: 3.67 MIL/uL — ABNORMAL LOW (ref 3.87–5.11)
RDW: 14.2 % (ref 11.5–15.5)
WBC: 7 10*3/uL (ref 4.0–10.5)

## 2016-08-17 LAB — BASIC METABOLIC PANEL
Anion gap: 8 (ref 5–15)
BUN: 26 mg/dL — ABNORMAL HIGH (ref 6–20)
CHLORIDE: 105 mmol/L (ref 101–111)
CO2: 25 mmol/L (ref 22–32)
CREATININE: 0.95 mg/dL (ref 0.44–1.00)
Calcium: 9.8 mg/dL (ref 8.9–10.3)
GFR calc non Af Amer: 60 mL/min — ABNORMAL LOW (ref >60)
Glucose, Bld: 91 mg/dL (ref 65–99)
Potassium: 4.2 mmol/L (ref 3.5–5.1)
SODIUM: 138 mmol/L (ref 135–145)

## 2016-08-17 LAB — I-STAT TROPONIN, ED
TROPONIN I, POC: 0 ng/mL (ref 0.00–0.08)
Troponin i, poc: 0 ng/mL (ref 0.00–0.08)

## 2016-08-17 NOTE — ED Provider Notes (Signed)
[facility] DEPT Provider Note   CSN: [id number] Arrival date & time: 08/17/16  1312     History   Chief Complaint Chief Complaint  Patient presents with  . Chest Pain   Complains of anterior chest pain nonradiating for one month. Pain never lasts longer than 1 minute onset rest. Nothing makes symptoms better or worse. Other associated symptoms include lightheadedness which is worse with exertion and improved with rest for one month. She does not have lightheadedness that the same time she has chest pain. She is presently asymptomatic without treatment. She saw her primary care physician Dr. Drema Dallas earlier today who sent her to the emergency department for further evaluation. HPI Beth Sullivan is a 71 y.o. female.  HPI  Past Medical History:  Diagnosis Date  . Allergic rhinitis   . Chronic headaches   . Hyperlipemia   . Hypertension   . Sleep apnea    uses a cpap    Patient Active Problem List   Diagnosis Date Noted  . Insomnia 05/13/2016  . Post-operative state 05/25/2013  . Acute bronchitis 09/21/2012  . Obstructive sleep apnea 05/29/2012    Past Surgical History:  Procedure Laterality Date  . ABDOMINAL HYSTERECTOMY  1975   BSO  . BREAST LUMPECTOMY WITH NEEDLE LOCALIZATION Left 05/12/2013   Procedure: BREAST LUMPECTOMY WITH NEEDLE LOCALIZATION;  Surgeon: Marcello Moores A. Cornett, MD;  Location: [facility];  Service: General;  Laterality: Left;  . COLONOSCOPY    . EYE SURGERY  2008   both cataracts  . FOOT SURGERY  08-2011   Left  . TUBAL LIGATION    . VESICOVAGINAL FISTULA CLOSURE W/ TAH      OB History    No data available       Home Medications    Prior to Admission medications   Medication Sig Start Date End Date Taking? Authorizing Provider  aspirin EC 81 MG tablet Take 81 mg by mouth at bedtime.   Yes Historical Provider, MD  atorvastatin (LIPITOR) 10 MG tablet Take 10 mg by mouth daily.   Yes Historical Provider, MD  Calcium  Carb-Cholecalciferol (CALCIUM 500 +D PO) Take 500 mg by mouth daily.   Yes Historical Provider, MD  chlorthalidone (HYGROTON) 25 MG tablet Take 25 mg by mouth daily.   Yes Historical Provider, MD  cholecalciferol (VITAMIN D) 1000 units tablet Take 1,000 Units by mouth daily.   Yes Historical Provider, MD  cloNIDine (CATAPRES - DOSED IN MG/24 HR) 0.1 mg/24hr patch Place 1 patch onto the skin every Sunday.    Yes Historical Provider, MD  Multiple Vitamin (MULTIVITAMIN WITH MINERALS) TABS tablet Take 1 tablet by mouth daily.   Yes Historical Provider, MD  Omega-3 Fatty Acids (FISH OIL) 1200 MG CAPS Take 1,200 mg by mouth 2 (two) times daily.    Yes Historical Provider, MD  spironolactone (ALDACTONE) 25 MG tablet Take 25 mg by mouth daily.   Yes Historical Provider, MD  valsartan (DIOVAN) 320 MG tablet Take 320 mg by mouth daily.   Yes Historical Provider, MD  verapamil (COVERA HS) 240 MG (CO) 24 hr tablet Take 240 mg by mouth 2 (two) times daily.    Yes Historical Provider, MD  vitamin C (ASCORBIC ACID) 500 MG tablet Take 500 mg by mouth daily.   Yes Historical Provider, MD  Ascorbic Acid (VITAMIN C) 1000 MG tablet Take 1,000 mg by mouth daily.    Historical Provider, MD  aspirin 81 MG tablet Take 81 mg by  mouth daily.    Historical Provider, MD  Calcium Carbonate-Vitamin D (CALCIUM + D PO) Take 1 tablet by mouth daily.     Historical Provider, MD  HYDROcodone-acetaminophen (NORCO) 5-325 MG per tablet Take 1 tablet by mouth every 6 (six) hours as needed for moderate pain. 05/12/13   Erroll Luna, MD  Multiple Vitamin (MULTIVITAMIN) tablet Take 1 tablet by mouth daily.    Historical Provider, MD    Family History Family History  Problem Relation Age of Onset  . Heart disease Mother   . Heart disease Brother    Mother had heart attack in her 8s. She also reports that his sister had a heart attack at age 24 Social History Social History  Substance Use Topics  . Smoking status: Never Smoker  .  Smokeless tobacco: Never Used  . Alcohol use No     Allergies   Tramadol   Review of Systems Review of Systems  Constitutional: Negative.   HENT: Negative.   Respiratory: Negative.   Cardiovascular: Positive for chest pain.  Gastrointestinal: Negative.   Musculoskeletal: Negative.   Skin: Negative.   Neurological: Positive for light-headedness.  Psychiatric/Behavioral: Negative.   All other systems reviewed and are negative.    Physical Exam Updated Vital Signs BP 132/69   Pulse 65   Temp 98.5 F (36.9 C) (Oral)   Resp 16   SpO2 97%   Physical Exam  Constitutional: She appears well-developed and well-nourished.  HENT:  Head: Normocephalic and atraumatic.  Eyes: Conjunctivae are normal. Pupils are equal, round, and reactive to light.  Neck: Neck supple. No tracheal deviation present. No thyromegaly present.  Cardiovascular: Normal rate and regular rhythm.   No murmur heard. Pulmonary/Chest: Effort normal and breath sounds normal.  Abdominal: Soft. Bowel sounds are normal. She exhibits no distension. There is no tenderness.  Obese  Musculoskeletal: Normal range of motion. She exhibits no edema or tenderness.  Neurological: She is alert. Coordination normal.  Skin: Skin is warm and dry. No rash noted.  Psychiatric: She has a normal mood and affect.  Nursing note and vitals reviewed.    ED Treatments / Results  Labs (all labs ordered are listed, but only abnormal results are displayed) Labs Reviewed  BASIC METABOLIC PANEL - Abnormal; Notable for the following:       Result Value   BUN 26 (*)    GFR calc non Af Amer 60 (*)    All other components within normal limits  CBC - Abnormal; Notable for the following:    RBC 3.67 (*)    Hemoglobin 11.1 (*)    HCT 34.2 (*)    All other components within normal limits  I-STAT TROPOININ, ED  I-STAT TROPOININ, ED    EKG  EKG Interpretation None     ED ECG REPORT   Date: 08/17/2016 1324pm  Rate: 70   Rhythm: normal sinus rhythm  QRS Axis: normal  Intervals: normal  ST/T Wave abnormalities: nonspecific T wave changes  Conduction Disutrbances:none  Narrative Interpretation:   Old EKG Reviewed: Unchanged from 04/28/2013  I have personally reviewed the EKG tracing and disagree with the computerized printout as noted. Doubt anterior MI age indeterminate   ED ECG REPORT   Date: 08/17/2016 1752pm  Rate: 70  Rhythm: normal sinus rhythm  QRS Axis: normal  Intervals: normal  ST/T Wave abnormalities: nonspecific T wave changes  Conduction Disutrbances:none  Narrative Interpretation:   Old EKG Reviewed: Unchanged from EKG performed earlier today  I have  personally reviewed the EKG tracing and disagree with the computerized printout as noted. Radiology Dg Chest 2 View  Result Date: 08/17/2016 CLINICAL DATA:  Shortness of breath and chest pressure ; dizziness EXAM: CHEST  2 VIEW COMPARISON:  Aug 17, 2016 FINDINGS: Lungs are clear. Heart size and pulmonary vascularity are normal. No adenopathy. No pneumothorax. There is degenerative change in the thoracic spine. IMPRESSION: No edema or consolidation. Electronically Signed   By: Lowella Grip III M.D.   On: 08/17/2016 14:01   Results for orders placed or performed during the hospital encounter of 45/80/99  Basic metabolic panel  Result Value Ref Range   Sodium 138 135 - 145 mmol/L   Potassium 4.2 3.5 - 5.1 mmol/L   Chloride 105 101 - 111 mmol/L   CO2 25 22 - 32 mmol/L   Glucose, Bld 91 65 - 99 mg/dL   BUN 26 (H) 6 - 20 mg/dL   Creatinine, Ser 0.95 0.44 - 1.00 mg/dL   Calcium 9.8 8.9 - 10.3 mg/dL   GFR calc non Af Amer 60 (L) >60 mL/min   GFR calc Af Amer >60 >60 mL/min   Anion gap 8 5 - 15  CBC  Result Value Ref Range   WBC 7.0 4.0 - 10.5 K/uL   RBC 3.67 (L) 3.87 - 5.11 MIL/uL   Hemoglobin 11.1 (L) 12.0 - 15.0 g/dL   HCT 34.2 (L) 36.0 - 46.0 %   MCV 93.2 78.0 - 100.0 fL   MCH 30.2 26.0 - 34.0 pg   MCHC 32.5 30.0 - 36.0 g/dL     RDW 14.2 11.5 - 15.5 %   Platelets 203 150 - 400 K/uL  I-stat troponin, ED  Result Value Ref Range   Troponin i, poc 0.00 0.00 - 0.08 ng/mL   Comment 3          I-stat troponin, ED  Result Value Ref Range   Troponin i, poc 0.00 0.00 - 0.08 ng/mL   Comment 3           Dg Chest 2 View  Result Date: 08/17/2016 CLINICAL DATA:  Shortness of breath and chest pressure ; dizziness EXAM: CHEST  2 VIEW COMPARISON:  Aug 17, 2016 FINDINGS: Lungs are clear. Heart size and pulmonary vascularity are normal. No adenopathy. No pneumothorax. There is degenerative change in the thoracic spine. IMPRESSION: No edema or consolidation. Electronically Signed   By: Lowella Grip III M.D.   On: 08/17/2016 14:01   Procedures Procedures (including critical care time) Chest x-ray viewed by me Medications Ordered in ED Medications - No data to display   Initial Impression / Assessment and Plan / ED Course  I have reviewed the triage vital signs and the nursing notes.  Pertinent labs & imaging results that were available during my care of the patient were reviewed by me and considered in my medical decision making (see chart for details).     7:10 PM patient remains asymptomatic. Patient has heart score for but I feel that she is safe for discharge as her story is highly atypical with 2 nonacute EKGs and 2 negative troponins with highly atypical story I've advised her to contact her primary care physician Dr. Drema Dallas for referral to cardiologist and outpatient cardiac workup Final Clinical Impressions(s) / ED Diagnoses  Diagnosis atypical chest pain Final diagnoses:  None    New Prescriptions New Prescriptions   No medications on file     Orlie Dakin, MD 08/17/16 1912

## 2016-08-17 NOTE — ED Notes (Signed)
Spoke with pt.  Updated on plan of care.  Warm blanket given.  Pt. Denies any chest pain at this time.

## 2016-08-17 NOTE — Discharge Instructions (Signed)
Call Dr. Drema Dallas on Monday, 08/20/2016. Ask her for a referral to a cardiologist. Return if concerned for any reason

## 2016-08-17 NOTE — ED Triage Notes (Signed)
Pt presents for evaluation of intermittent central chest pressure with dizziness and sob x 1 month. Pt reports seen by pcp and sent here for further evaluation. Pt denies cp on arrival.

## 2016-08-23 ENCOUNTER — Telehealth: Payer: Self-pay | Admitting: Cardiology

## 2016-08-23 ENCOUNTER — Telehealth: Payer: Self-pay | Admitting: *Deleted

## 2016-08-23 NOTE — Telephone Encounter (Signed)
Received records from [facility] for appointment on 08/24/16 with Dr Ellyn Hack.  Records put with Dr Allison Quarry schedule for 08/24/16. lp

## 2016-08-23 NOTE — Telephone Encounter (Signed)
NOTES SENT TO SCHEDULING.  °

## 2016-08-24 ENCOUNTER — Ambulatory Visit (INDEPENDENT_AMBULATORY_CARE_PROVIDER_SITE_OTHER): Payer: Medicare Other | Admitting: Cardiology

## 2016-08-24 VITALS — BP 138/70 | HR 78 | Ht 62.0 in | Wt 265.0 lb

## 2016-08-24 DIAGNOSIS — R000 Tachycardia, unspecified: Secondary | ICD-10-CM | POA: Insufficient documentation

## 2016-08-24 DIAGNOSIS — R0789 Other chest pain: Secondary | ICD-10-CM | POA: Diagnosis not present

## 2016-08-24 DIAGNOSIS — I10 Essential (primary) hypertension: Secondary | ICD-10-CM | POA: Diagnosis not present

## 2016-08-24 DIAGNOSIS — R0609 Other forms of dyspnea: Secondary | ICD-10-CM

## 2016-08-24 DIAGNOSIS — G4733 Obstructive sleep apnea (adult) (pediatric): Secondary | ICD-10-CM

## 2016-08-24 NOTE — Patient Instructions (Signed)
Medication Instructions:   NO CHANGE  Testing/Procedures:  Your physician has requested that you have an echocardiogram. Echocardiography is a painless test that uses sound waves to create images of your heart. It provides your doctor with information about the size and shape of your heart and how well your heart's chambers and valves are working. This procedure takes approximately one hour. There are no restrictions for this procedure.   Your physician has recommended that you have a cardiopulmonary stress test (CPX). CPX testing is a non-invasive measurement of heart and lung function. It replaces a traditional treadmill stress test. This type of test provides a tremendous amount of information that relates not only to your present condition but also for future outcomes. This test combines measurements of you ventilation, respiratory gas exchange in the lungs, electrocardiogram (EKG), blood pressure and physical response before, during, and following an exercise protocol.    Follow-Up:  Your physician recommends that you schedule a follow-up appointment in: AFTER TESTING COMPLETE WITH DR Ellyn Hack

## 2016-08-24 NOTE — Progress Notes (Signed)
PCP: Leighton Ruff, MD  Clinic Note: Chief Complaint  Patient presents with  . Shortness of Breath    pt states some and states getting light headed   . Chest Pain    some  . Edema    both feet     HPI: Beth Sullivan is a 71 y.o. female who is being seen today for the evaluation of CP, SB & palpitations  at the request of Leighton Ruff, MD (& Dr. Winfred Leeds - EDP).  Beth Sullivan was last seen at [facility] ER on 08/18/2015 -4 anterior chest pain for roughly 1 month. Those never last more than 1 minute. She was seen by her PCP and sent to the ER for evaluation. She ruled out for MI and was discharged home to follow up with cardiology.  Studies Personally Reviewed - (if available, images/films reviewed: From Epic Chart or Care Everywhere)  EKG from PCPs office 08/22/2016: Sinus rhythm, rate 71 BPM.  Criteria for LVH but otherwise normal  Interval History: Beth Sullivan is a pleasant woman who presents today with symptoms of shortness of breath with exertion simply walking into the office. She did get lightheaded if she gets more short of breath and usual. She basically has a crazy feeling in her chest and head when she exerts herself. Interestingly, she also notes having some chest discomfort that is described as a sharp discomfort along her left chest wall that is often associated with eating and can be of both rest or walking around, but not necessary noted with exertion. When she does note is that she'll occasionally feel her heart rate go very fast especially she exerts herself. The fast heart rates and shortness of breath often coincide. She really doesn't notice other episodes of rapid irregular heartbeats or palpitations. She does have some orthostatic dizzy symptoms with standing up quickly, but does not have any syncope or near-syncope type symptoms. She denies any PND, orthopnea or edema.    No weakness or syncope/near syncope, or TIA/amaurosis fugax symptoms.  No  claudication.  ROS: A comprehensive was performed. Review of Systems  Constitutional: Positive for malaise/fatigue (no E for doing housework / exercise). Negative for chills and fever.  HENT: Positive for congestion.   Eyes: Negative.   Respiratory: Positive for cough, shortness of breath (only w/ exertion) and wheezing.        From allergies  Cardiovascular: Positive for chest pain, palpitations and leg swelling (legs / feet ; hands in AM). Negative for orthopnea, claudication and PND.       Per HPI  Gastrointestinal: Negative for abdominal pain, blood in stool, heartburn, melena, nausea and vomiting.  Genitourinary: Negative for hematuria.  Musculoskeletal: Positive for back pain (limits activity).  Skin: Negative.   Neurological: Positive for dizziness (positional - bending over, orthostatic). Negative for loss of consciousness (if she gets laughing a lot) and weakness.  Endo/Heme/Allergies: Positive for environmental allergies.  All other systems reviewed and are negative.   I have reviewed and (if needed) personally updated the patient's problem list, medications, allergies, past medical and surgical history, social and family history.   Past Medical History:  Diagnosis Date  . Allergic rhinitis   . Chronic headaches   . Chronic pain disorder    From long-standing cervical spine disease - chronic pain management  . CKD (chronic kidney disease), stage II    Per PCP notes - Cr 0.9-1.3  . History of colon polyps 02/2014   Dr. Lizbeth Bark  . Hyperlipemia   .  Hypertension   . Morbid obesity with BMI of 45.0-49.9, adult ([place])   . Sleep apnea    uses a cpap - Dr. Annamaria Boots    Past Surgical History:  Procedure Laterality Date  . ABDOMINAL HYSTERECTOMY  1975   BSO  . BREAST LUMPECTOMY WITH NEEDLE LOCALIZATION Left 05/12/2013   Procedure: BREAST LUMPECTOMY WITH NEEDLE LOCALIZATION;  Surgeon: Marcello Moores A. Cornett, MD;  Location: [facility];  Service: General;  Laterality:  Left;  . COLONOSCOPY    . EYE SURGERY  2008   both cataracts  . FOOT SURGERY  08-2011   Left  . TUBAL LIGATION    . VESICOVAGINAL FISTULA CLOSURE W/ TAH      Current Meds  Medication Sig  . aspirin EC 81 MG tablet Take 81 mg by mouth at bedtime.  Marland Kitchen atorvastatin (LIPITOR) 10 MG tablet Take 10 mg by mouth daily.  . Calcium Carb-Cholecalciferol (CALCIUM 500 +D PO) Take 500 mg by mouth daily.  . chlorthalidone (HYGROTON) 25 MG tablet Take 25 mg by mouth daily.  . cholecalciferol (VITAMIN D) 1000 units tablet Take 1,000 Units by mouth daily.  . cloNIDine (CATAPRES - DOSED IN MG/24 HR) 0.1 mg/24hr patch Place 1 patch onto the skin every Sunday.   Marland Kitchen HYDROcodone-acetaminophen (NORCO) 5-325 MG per tablet Take 1 tablet by mouth every 6 (six) hours as needed for moderate pain.  . meloxicam (MOBIC) 7.5 MG tablet Take 7.5 mg by mouth 2 (two) times daily.  . Multiple Vitamin (MULTIVITAMIN WITH MINERALS) TABS tablet Take 1 tablet by mouth daily.  . Omega-3 Fatty Acids (FISH OIL) 1200 MG CAPS Take 1,200 mg by mouth 2 (two) times daily.   Marland Kitchen PRESCRIPTION MEDICATION Inhale into the lungs at bedtime.  Marland Kitchen spironolactone (ALDACTONE) 25 MG tablet Take 25 mg by mouth daily.  . valsartan (DIOVAN) 320 MG tablet Take 320 mg by mouth daily.  . verapamil (COVERA HS) 240 MG (CO) 24 hr tablet Take 240 mg by mouth 2 (two) times daily.   . vitamin C (ASCORBIC ACID) 500 MG tablet Take 500 mg by mouth daily.    Allergies  Allergen Reactions  . Tramadol Nausea And Vomiting    Social History   Social History  . Marital status: Married    Spouse name: N/A  . Number of children: 1  . Years of education: 17   Occupational History  . 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 Smoking status: Never Smoker  . Smokeless tobacco: Never Used  . Alcohol use No  . Drug use: No  . Sexual activity: Not Asked   Other Topics Concern  . None   Social History Narrative   She  remarried for 50 years. They have 1 child and 2 grandchildren. She lives with her husband.   She is trying to exercise doing water exercises because it is something she can do with chronic neck/back pain.    family history includes Diabetes in her father and mother; Heart disease in her brother, mother, and sister; Hypertension in her father, mother, and sister; Prostate cancer in her brother and brother.  Wt Readings from Last 3 Encounters:  08/24/16 265 lb (120.2 kg)  05/07/16 257 lb 3.2 oz (116.7 kg)  11/19/14 236 lb 9.6 oz (107.3 kg)    PHYSICAL EXAM BP 138/70   Pulse 78   Ht 5\' 2"  (1.575 m)   Wt 265 lb (120.2 kg)  SpO2 97%   BMI 48.47 kg/m  General appearance: alert, cooperative, appears stated age, no distress. morbidly obese; well groomed. HEENT: [place]/AT, EOMI, MMM, anicteric sclera Neck: no adenopathy, no carotid bruit and + JVD ~ 10 cm H20 Lungs: clear to auscultation bilaterally, normal percussion bilaterally and non-labored Heart: regular rate and rhythm, S1 &S2 normal, no murmur, click, rub or gallop; - unable to palpate PMI Abdomen: soft, non-tender; bowel sounds normal; no masses,  no organomegaly; + HJR Extremities: extremities normal, atraumatic, no cyanosis, and edema - ~1+ MSK: no deformities.  Unable to truly reproduce CP on exam. Pulses: 2+ and symmetric;  Skin: mobility and turgor normal, no evidence of bleeding or bruising, no lesions noted, temperature normal and texture normal or  Neurologic: Mental status: Alert & oriented x 3, thought content appropriate; non-focal exam.  Pleasant mood & affect. Cranial nerves: normal (II-XII grossly intact)    Adult ECG Report -n/a - ER EKG personally reviewed & essentially normal.   Other studies Reviewed: Additional studies/ records that were reviewed today include:  Recent Labs:  PCP last room the last several years scanned in the chart -pertinent lipids noted below. Lab Results  Component Value Date    CREATININE 0.95 08/17/2016   BUN 26 (H) 08/17/2016   NA 138 08/17/2016   K 4.2 08/17/2016   CL 105 08/17/2016   CO2 25 08/17/2016   12/20-17: CHOL 182, HDL 55, LDLCALC 106, TRIG 107, CHOLHDL - 3.3  ASSESSMENT / PLAN: Problem List Items Addressed This Visit    Atypical chest pain    Interesting that she has chest pain that is more rest and not with exertion when she has significant dyspnea. Probably this discomfort is not anginal, but could be related to elevated filling pressures in which case a 2-D echocardiogram is warranted. We will also evaluate potential ischemia with CPX.      Relevant Orders   ECHOCARDIOGRAM COMPLETE   Dyspnea on exertion    TIMI her profound exertional dyspnea is but is concerning. This is in the setting of having elevated JVD and HJR. She doesn't really notice PND or orthopnea however. Certainly some of this can be related to obesity and deconditioning, however we need to exclude left-sided heart failure as well as potentially pulmonary hypertension. Plan:   2-D echocardiogram - to evaluate LV function, pressures as well as pulmonary pressures.  We'll also check A Cardiopulmonary Exercise Test (CPX) aorta determine if her dyspnea is related to cardiac etiology versus pulmonary or simply deconditioning etiology.  For potential heart failure she is on an itching regimen already with valsartan, verapamil and chlorthalidone as well as spironolactone. Not currently on loop diuretic.      Relevant Orders   Cardiopulmonary exercise test   Essential hypertension (Chronic)    Clearly this is been difficult to control as she is on chlorthalidone, clonidine, spironolactone as well as valsartan and verapamil. We'll see what her exercise tolerance is as well as her EF/diastolic function and pulmonary pressures are via 2-D echo and CPX      Obstructive sleep apnea - Primary    She now seems to be tolerating CPAP. With long-standing CPAP and obesity, obesity  ventilation syndrome with pulmonary hypertension and/or cor pulmonale are always a possibility. 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 I will to see where her sons tolerances with a CPX. She does have blood pressure room which  would allow [place] to potentially use higher dose of verapamil  Does not sound like these are true arrhythmias. Probably more related to deconditioning which would then go along with exertional dyspnea.         Current medicines are reviewed at length with the patient today. (+/- concerns) n/a The following changes have been made: n/a  Patient Instructions  Medication Instructions:   NO CHANGE  Testing/Procedures:  Your physician has requested that you have an echocardiogram. Echocardiography is a painless test that uses sound waves to create images of your heart. It provides your doctor with information about the size and shape of your heart and how well your heart's chambers and valves are working. This procedure takes approximately one hour. There are no restrictions for this procedure.   Your physician has recommended that you have a cardiopulmonary stress test (CPX). CPX testing is a non-invasive measurement of heart and lung function. It replaces a traditional treadmill stress test. This type of test provides a tremendous amount of information that relates not only to your present condition but also for future outcomes. This test combines measurements of you ventilation, respiratory gas exchange in the lungs, electrocardiogram (EKG), blood pressure and physical response before, during, and following an exercise protocol.    Follow-Up:  Your physician recommends that you schedule a follow-up appointment in: AFTER TESTING COMPLETE WITH DR Ellyn Hack      Studies Ordered:   Orders Placed This Encounter  Procedures  . Cardiopulmonary exercise test  . ECHOCARDIOGRAM COMPLETE      Glenetta Hew, M.D., M.S. Interventional Cardiologist   Pager # [phone] Phone # [phone] [address] [place], [place] [address]

## 2016-08-26 ENCOUNTER — Encounter: Payer: Self-pay | Admitting: Cardiology

## 2016-08-26 DIAGNOSIS — I10 Essential (primary) hypertension: Secondary | ICD-10-CM | POA: Insufficient documentation

## 2016-08-26 NOTE — Assessment & Plan Note (Signed)
Interesting that she has chest pain that is more rest and not with exertion when she has significant dyspnea. Probably this discomfort is not anginal, but could be related to elevated filling pressures in which case a 2-D echocardiogram is warranted. We will also evaluate potential ischemia with CPX.

## 2016-08-26 NOTE — Assessment & Plan Note (Signed)
She now seems to be tolerating CPAP. With long-standing CPAP and obesity, obesity ventilation syndrome with pulmonary hypertension and/or cor pulmonale are always a possibility. Plan: 2-D echo

## 2016-08-26 NOTE — Assessment & Plan Note (Signed)
Clearly this is been difficult to control as she is on chlorthalidone, clonidine, spironolactone as well as valsartan and verapamil. We'll see what her exercise tolerance is as well as her EF/diastolic function and pulmonary pressures are via 2-D echo and CPX

## 2016-08-26 NOTE — Assessment & Plan Note (Addendum)
TIMI her profound exertional dyspnea is but is concerning. This is in the setting of having elevated JVD and HJR. She doesn't really notice PND or orthopnea however. Certainly some of this can be related to obesity and deconditioning, however we need to exclude left-sided heart failure as well as potentially pulmonary hypertension. Plan:   2-D echocardiogram - to evaluate LV function, pressures as well as pulmonary pressures.  We'll also check A Cardiopulmonary Exercise Test (CPX) aorta determine if her dyspnea is related to cardiac etiology versus pulmonary or simply deconditioning etiology.  For potential heart failure she is on an itching regimen already with valsartan, verapamil and chlorthalidone as well as spironolactone. Not currently on loop diuretic.

## 2016-08-26 NOTE — Assessment & Plan Note (Signed)
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 I will to see where her sons tolerances with a CPX. She does have blood pressure room which would allow [place] to potentially use higher dose of verapamil  Does not sound like these are true arrhythmias. Probably more related to deconditioning which would then go along with exertional dyspnea.

## 2016-09-06 ENCOUNTER — Encounter (HOSPITAL_COMMUNITY): Payer: Medicare Other

## 2016-09-11 ENCOUNTER — Ambulatory Visit (HOSPITAL_COMMUNITY): Payer: Medicare Other | Attending: Cardiovascular Disease

## 2016-09-11 ENCOUNTER — Other Ambulatory Visit: Payer: Self-pay

## 2016-09-11 DIAGNOSIS — I081 Rheumatic disorders of both mitral and tricuspid valves: Secondary | ICD-10-CM | POA: Insufficient documentation

## 2016-09-11 DIAGNOSIS — I10 Essential (primary) hypertension: Secondary | ICD-10-CM | POA: Diagnosis not present

## 2016-09-11 DIAGNOSIS — E785 Hyperlipidemia, unspecified: Secondary | ICD-10-CM | POA: Insufficient documentation

## 2016-09-11 DIAGNOSIS — Z6841 Body Mass Index (BMI) 40.0 and over, adult: Secondary | ICD-10-CM | POA: Insufficient documentation

## 2016-09-11 DIAGNOSIS — R0789 Other chest pain: Secondary | ICD-10-CM | POA: Diagnosis not present

## 2016-09-11 LAB — ECHOCARDIOGRAM COMPLETE
Ao-asc: 29 cm
CHL CUP DOP CALC LVOT VTI: 22.1 cm
CHL CUP LV S' LATERAL: 12.4 cm/s
E decel time: 313 msec
E/e' ratio: 8.83
FS: 33 % (ref 28–44)
IV/PV OW: 0.92
LA ID, A-P, ES: 39 mm
LA diam end sys: 39 mm
LA diam index: 1.81 cm/m2
LA vol index: 28.8 mL/m2
LA vol: 62 mL
LAVOLA4C: 59 mL
LV PW d: 13.1 mm — AB (ref 0.6–1.1)
LV TDI E'LATERAL: 9.16
LV e' LATERAL: 9.16 cm/s
LVEEAVG: 8.83
LVEEMED: 8.83
LVOT area: 2.84 cm2
LVOT diameter: 19 mm
LVOT peak grad rest: 3 mmHg
LVOT peak vel: 92.5 cm/s
LVOTSV: 63 mL
MV Dec: 313
MV pk A vel: 107 m/s
MVPG: 3 mmHg
MVPKEVEL: 80.9 m/s
Reg peak vel: 254 cm/s
TDI e' medial: 5.46
TRMAXVEL: 254 cm/s

## 2016-09-13 ENCOUNTER — Telehealth: Payer: Self-pay | Admitting: *Deleted

## 2016-09-13 NOTE — Telephone Encounter (Signed)
-----   Message from Leonie Man, MD sent at 09/12/2016 10:43 PM EDT ----- Echocardiogram result: Normal heart function with an ejection fraction of 60-65% (upper limit of normal range of 55-65%). Expected abnormal relaxation for age. No significant valve disease. Normal lung pressures.  Glenetta Hew, MD

## 2016-09-13 NOTE — Telephone Encounter (Signed)
Spoke to patient. Result given . Verbalized understanding  

## 2016-09-17 ENCOUNTER — Ambulatory Visit (HOSPITAL_COMMUNITY): Payer: Medicare Other | Attending: Cardiology

## 2016-09-17 ENCOUNTER — Other Ambulatory Visit (HOSPITAL_COMMUNITY): Payer: Self-pay | Admitting: Surgery

## 2016-09-17 DIAGNOSIS — R0600 Dyspnea, unspecified: Secondary | ICD-10-CM | POA: Diagnosis not present

## 2016-09-17 DIAGNOSIS — R0609 Other forms of dyspnea: Secondary | ICD-10-CM | POA: Diagnosis not present

## 2016-10-05 ENCOUNTER — Encounter: Payer: Self-pay | Admitting: Registered"

## 2016-10-05 ENCOUNTER — Encounter: Payer: Medicare Other | Attending: Surgery | Admitting: Registered"

## 2016-10-05 DIAGNOSIS — Z713 Dietary counseling and surveillance: Secondary | ICD-10-CM | POA: Diagnosis not present

## 2016-10-05 DIAGNOSIS — Z6841 Body Mass Index (BMI) 40.0 and over, adult: Secondary | ICD-10-CM | POA: Insufficient documentation

## 2016-10-05 DIAGNOSIS — E669 Obesity, unspecified: Secondary | ICD-10-CM

## 2016-10-05 NOTE — Progress Notes (Signed)
Pre-Op Assessment Visit:  Pre-Operative Sleeve Gastrectomy Surgery  Medical Nutrition Therapy:  Appt start time: 9:17  End time:  10:17  Patient was seen on 10/05/2016 for Pre-Operative Nutrition Assessment. Assessment and letter of approval faxed to [facility] Bariatric Surgery Program coordinator on 10/05/2016.   Pt expectation of surgery: "get myself healthy", improve quality of life and be here for grandchildren  Pt expectation of Dietitian: how to eat, what to eat/not to eat  Start weight at NDES: 261.6 BMI: 47.85   Pt is a nice lady. Pt states she wants to change from sleeve to RYGB due to niece and friend having great results with RYGB. Pt needs Vitamin and Mineral Recommendation education and handout at next visit.   Per insurance, pt needs 6 SWL visits prior to surgery.    24 hr Dietary Recall: First Meal: protein shake (plant-based protein powder, blueberries, 1/2 apple, banana, flaxseed, almond milk, ice) Snack: none Second Meal: skips Snack: sometimes cheetos, apple, Snack[place] cookies, pork rinds, almonds Third Meal:  smoked [place], rice, cabbage, okra Snack: sometimes [place] sandwich Beverages: sprite, ginger ale, water, decaf coffee, tea (occassionally)  Encouraged to engage in 150 minutes of moderate physical activity including cardiovascular and weight baring weekly  Handouts given during visit include:  . Pre-Op Goals . Bariatric Surgery Protein Shakes  During the appointment today the following Pre-Op Goals were reviewed with the patient: . Maintain or lose weight as instructed by your surgeon . Make healthy food choices . Begin to limit portion sizes . Limited concentrated sugars and fried foods . Keep fat/sugar in the single digits per serving on          food labels . Practice CHEWING your food  (aim for 30 chews per bite or until applesauce consistency) . Practice not drinking 15 minutes before, during, and 30 minutes after each  meal/snack . Avoid all carbonated beverages  . Avoid/limit caffeinated beverages  . Avoid all sugar-sweetened beverages . Consume 3 meals per day; eat every 3-5 hours . Make a list of non-food related activities . Aim for 64-100 ounces of FLUID daily  . Aim for at least 60-80 grams of PROTEIN daily . Look for a liquid protein source that contain ?15 g protein and ?5 g carbohydrate  (ex: shakes, drinks, shots) . Physical activity is an important part of a healthy lifestyle so keep it moving!  Follow diet recommendations listed below Energy and Macronutrient Recommendations: Calories: 1600 Carbohydrate: 180 Protein: 120 Fat: 44  Demonstrated degree of understanding via:  Teach Back   Teaching Method Utilized:  Visual Auditory Hands on  Barriers to learning/adherence to lifestyle change: none  Patient to call the Nutrition and Diabetes Education Services to enroll in Pre-Op and Post-Op Nutrition Education when surgery date is scheduled.

## 2016-10-09 ENCOUNTER — Other Ambulatory Visit: Payer: Self-pay

## 2016-10-09 ENCOUNTER — Other Ambulatory Visit (HOSPITAL_COMMUNITY): Payer: Self-pay | Admitting: Surgery

## 2016-10-09 ENCOUNTER — Ambulatory Visit (HOSPITAL_COMMUNITY)
Admission: RE | Admit: 2016-10-09 | Discharge: 2016-10-09 | Disposition: A | Discharge status: Home / Self Care | Payer: Medicare Other | Source: Ambulatory Visit | Attending: Surgery | Admitting: Surgery

## 2016-10-09 DIAGNOSIS — R918 Other nonspecific abnormal finding of lung field: Secondary | ICD-10-CM | POA: Diagnosis not present

## 2016-10-09 DIAGNOSIS — Z01818 Encounter for other preprocedural examination: Secondary | ICD-10-CM | POA: Insufficient documentation

## 2016-10-09 DIAGNOSIS — Z01810 Encounter for preprocedural cardiovascular examination: Secondary | ICD-10-CM | POA: Diagnosis present

## 2016-10-12 ENCOUNTER — Ambulatory Visit (INDEPENDENT_AMBULATORY_CARE_PROVIDER_SITE_OTHER): Payer: Medicare Other | Admitting: Cardiology

## 2016-10-12 ENCOUNTER — Encounter: Payer: Self-pay | Admitting: Cardiology

## 2016-10-12 ENCOUNTER — Ambulatory Visit: Payer: Self-pay | Admitting: Cardiology

## 2016-10-12 DIAGNOSIS — I10 Essential (primary) hypertension: Secondary | ICD-10-CM

## 2016-10-12 DIAGNOSIS — R0609 Other forms of dyspnea: Secondary | ICD-10-CM | POA: Diagnosis not present

## 2016-10-12 DIAGNOSIS — R0789 Other chest pain: Secondary | ICD-10-CM

## 2016-10-12 DIAGNOSIS — R000 Tachycardia, unspecified: Secondary | ICD-10-CM | POA: Diagnosis not present

## 2016-10-12 NOTE — Progress Notes (Signed)
PCP: Leighton Ruff, MD  Clinic Note: Chief Complaint  Patient presents with  . Follow-up    CP, DOE -- CPX & Echo results    HPI: Beth Sullivan is a 71 y.o. female who is being seen today for follow-up evaluation of CP, DOE -- CPX & Echo results at the request of [place], Malka So, MD.  Beth Sullivan was last seen on 08/24/2016 = she had exertional dyspnea and some lightheadedness. She had crazy feeling in her chest with exertion.  Recent Hospitalizations: None since her May 4 ER visit for chest discomfort  Studies Personally Reviewed - (if available, images/films reviewed: From Epic Chart or Care Everywhere)  *CPX - 09/17/2016: No evidence of cardiopulmonary limitations. limitations related to weight and weight related issues. Also hypertensive response to exercise indicates deconditioning. -> Recommend weight loss and exercise. Along with blood pressure control.  Echo 09/11/2016: Normal LV function EF 60 -65%. GR 1 DD. Otherwise normal  Interval History: Beth Sullivan presents today doing fairly well. She says she had another episode similar to what she had a church that was shortly after eating and she felt like she had a piece of food get stuck in her throat and this is very similar to the symptoms she had. Otherwise she's not had any further episodes and was quite happy with the results of her stress test. The only symptom she may note is that she occasionally gets some dizziness when she is is exposed to hot weather. She does note being a little bit out of shape and will get short of breath if she overexerts or walks up hills or stairs. She also notes being somewhat lazy without of energy.  Otherwise negative cardiac review of symptoms as follows:  No chest pain with rest or exertion.   No PND or orthopnea with mild hand and feet swelling  No palpitations, lightheadedness, weakness or syncope/near syncope.  No TIA/amaurosis fugax symptoms.  No claudication.  ROS: A  comprehensive was performed. Review of Systems  Constitutional: Negative for malaise/fatigue.  HENT: Positive for congestion. Negative for nosebleeds and sinus pain.   Respiratory:       Allergy related congestion, cough and wheezing  Gastrointestinal: Negative for blood in stool, heartburn and melena.  Genitourinary: Negative for hematuria.  Musculoskeletal: Positive for back pain.  Neurological: Positive for dizziness. Negative for focal weakness.  Endo/Heme/Allergies: Positive for environmental allergies.  Psychiatric/Behavioral: The patient is not nervous/anxious.   All other systems reviewed and are negative.  I have reviewed and (if needed) personally updated the patient's problem list, medications, allergies, past medical and surgical history, social and family history.   Past Medical History:  Diagnosis Date  . Allergic rhinitis   . Chronic headaches   . Chronic pain disorder    From long-standing cervical spine disease - chronic pain management  . CKD (chronic kidney disease), stage II    Per PCP notes - Cr 0.9-1.3  . History of colon polyps 02/2014   Dr. Lizbeth Bark  . Hyperlipemia   . Hypertension   . Morbid obesity with BMI of 45.0-49.9, adult ([place])   . Sleep apnea    uses a cpap - Dr. Annamaria Boots    Past Surgical History:  Procedure Laterality Date  . ABDOMINAL HYSTERECTOMY  1975   BSO  . BREAST LUMPECTOMY WITH NEEDLE LOCALIZATION Left 05/12/2013   Procedure: BREAST LUMPECTOMY WITH NEEDLE LOCALIZATION;  Surgeon: Marcello Moores A. Cornett, MD;  Location: [place];  Service: General;  Laterality: Left;  . COLONOSCOPY    . EYE SURGERY  2008   both cataracts  . FOOT SURGERY  08-2011   Left  . TUBAL LIGATION    . VESICOVAGINAL FISTULA CLOSURE W/ TAH      Current Meds  Medication Sig  . aspirin EC 81 MG tablet Take 81 mg by mouth at bedtime.  Marland Kitchen atorvastatin (LIPITOR) 10 MG tablet Take 10 mg by mouth daily.  . Calcium Carb-Cholecalciferol (CALCIUM 500 +D PO)  Take 500 mg by mouth daily.  . chlorthalidone (HYGROTON) 25 MG tablet Take 25 mg by mouth daily.  . cholecalciferol (VITAMIN D) 1000 units tablet Take 1,000 Units by mouth daily.  . cloNIDine (CATAPRES - DOSED IN MG/24 HR) 0.1 mg/24hr patch Place 1 patch onto the skin every Sunday.   . Multiple Vitamin (MULTIVITAMIN WITH MINERALS) TABS tablet Take 1 tablet by mouth daily.  . Omega-3 Fatty Acids (FISH OIL) 1200 MG CAPS Take 1,200 mg by mouth 2 (two) times daily.   Marland Kitchen PRESCRIPTION MEDICATION Inhale into the lungs at bedtime.  Marland Kitchen spironolactone (ALDACTONE) 25 MG tablet Take 25 mg by mouth daily.  . valsartan (DIOVAN) 320 MG tablet Take 320 mg by mouth daily.  . verapamil (COVERA HS) 240 MG (CO) 24 hr tablet Take 240 mg by mouth 2 (two) times daily.   . vitamin C (ASCORBIC ACID) 500 MG tablet Take 500 mg by mouth daily.    Allergies  Allergen Reactions  . Tramadol Nausea And Vomiting    Social History   Social History  . Marital status: Married    Spouse name: N/A  . Number of children: 1  . Years of education: 23   Occupational History  . 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 Smoking status: Never Smoker  . Smokeless tobacco: Never Used  . Alcohol use No  . Drug use: No  . Sexual activity: Not Asked   Other Topics Concern  . None   Social History Narrative   She remarried for 50 years. They have 1 child and 2 grandchildren. She lives with her husband.   She is trying to exercise doing water exercises because it is something she can do with chronic neck/back pain.    family history includes Diabetes in her father and mother; Heart disease in her brother, mother, and sister; Hypertension in her father, mother, and sister; Prostate cancer in her brother and brother.  Wt Readings from Last 3 Encounters:  10/12/16 260 lb (117.9 kg)  10/05/16 261 lb 9.6 oz (118.7 kg)  08/24/16 265 lb (120.2 kg)    PHYSICAL EXAM BP 102/70 (BP  Location: Right Arm, Patient Position: Sitting, Cuff Size: Normal)   Pulse 70   Ht 5\' 2"  (1.575 m)   Wt 260 lb (117.9 kg)   BMI 47.55 kg/m  General appearance: alert, cooperative, appears stated age, no distress. Morbidly obese HEENT: [place]/AT, EOMI, MMM, anicteric sclera Neck: no adenopathy, no carotid bruit and no JVD Lungs: clear to auscultation bilaterally, normal percussion bilaterally and non-labored Heart: regular rate and rhythm, S1 &S2 normal, no murmur, click, rub or gallop; Abdomen: soft, non-tender; bowel sounds normal; no masses,  no organomegaly; no HJR Extremities: extremities normal, atraumatic, no cyanosis, and edema - trivial Pulses: 2+ and symmetric;  Neurologic: Mental status: Alert & oriented x 3, thought content appropriate; non-focal exam.  Pleasant mood & affect.   Adult ECG Report n/a  Other studies Reviewed: Additional studies/ records that were reviewed today include:  Recent Labs:  n/a  ASSESSMENT / PLAN: Problem List Items Addressed This Visit    Atypical chest pain    Most likely not cardiac in nature, especially since she had reproduction of discomfort with getting food secondary to throat. Probably more related to dysphagia/odynophagia than true angina. Did relatively well CPX, limited by probably her back pain and deconditioning last obesity  Consider PPI.      Dyspnea on exertion    Relatively normal echo with only grade 1 diastolic dysfunction that is not unexpected for her age and obesity/hypertension.  Also cortical access test did not show any signs of cardiac or pulmonary limitations. Only suggesting obesity and deconditioning. Recommend weight loss with diet and exercise.      Essential hypertension (Chronic)    Controlled today on her current medications. I'm not sure the diastolic dysfunction of the major issue with her based on her current blood pressure. She is on ARB and verapamil as well as chlorthalidone plus spironolactone all of  which are effective treatments.      Rapid heartbeat (Chronic)    She did have her heart rate go up pretty quickly during exercise which is probably more signed and deconditioning based on the results of CPX. Would not treat tachycardia beyond her baseline dose of verapamil.         Current medicines are reviewed at length with the patient today. (+/- concerns) n/a The following changes have been made: n/a  Patient Instructions  NO CHANGE WITH MEDICATIONS    MAY USE MUSTARD IF NEEDED FOR  MUSCLE CRAMPS     Your physician recommends that you schedule a follow-up appointment on an as needed basis.    Studies Ordered:   No orders of the defined types were placed in this encounter.     Glenetta Hew, M.D., M.S. Interventional Cardiologist   Pager # [phone] Phone # [phone] [address] [place], [place] [address]

## 2016-10-12 NOTE — Patient Instructions (Addendum)
NO CHANGE WITH MEDICATIONS    MAY USE MUSTARD IF NEEDED FOR  MUSCLE CRAMPS     Your physician recommends that you schedule a follow-up appointment on an as needed basis.

## 2016-10-14 ENCOUNTER — Encounter: Payer: Self-pay | Admitting: Cardiology

## 2016-10-14 NOTE — Assessment & Plan Note (Signed)
Most likely not cardiac in nature, especially since she had reproduction of discomfort with getting food secondary to throat. Probably more related to dysphagia/odynophagia than true angina. Did relatively well CPX, limited by probably her back pain and deconditioning last obesity  Consider PPI.

## 2016-10-14 NOTE — Assessment & Plan Note (Signed)
Relatively normal echo with only grade 1 diastolic dysfunction that is not unexpected for her age and obesity/hypertension.  Also cortical access test did not show any signs of cardiac or pulmonary limitations. Only suggesting obesity and deconditioning. Recommend weight loss with diet and exercise.

## 2016-10-14 NOTE — Assessment & Plan Note (Signed)
Controlled today on her current medications. I'm not sure the diastolic dysfunction of the major issue with her based on her current blood pressure. She is on ARB and verapamil as well as chlorthalidone plus spironolactone all of which are effective treatments.

## 2016-10-14 NOTE — Assessment & Plan Note (Addendum)
She did have her heart rate go up pretty quickly during exercise which is probably more signed and deconditioning based on the results of CPX. Would not treat tachycardia beyond her baseline dose of verapamil.

## 2016-11-06 ENCOUNTER — Encounter: Payer: Self-pay | Admitting: Registered"

## 2016-11-06 ENCOUNTER — Encounter: Payer: Medicare Other | Attending: Surgery | Admitting: Registered"

## 2016-11-06 DIAGNOSIS — Z713 Dietary counseling and surveillance: Secondary | ICD-10-CM | POA: Diagnosis not present

## 2016-11-06 DIAGNOSIS — I10 Essential (primary) hypertension: Secondary | ICD-10-CM | POA: Insufficient documentation

## 2016-11-06 DIAGNOSIS — E669 Obesity, unspecified: Secondary | ICD-10-CM

## 2016-11-06 DIAGNOSIS — Z6841 Body Mass Index (BMI) 40.0 and over, adult: Secondary | ICD-10-CM | POA: Diagnosis not present

## 2016-11-06 NOTE — Patient Instructions (Addendum)
-   Find 1 or 2 more flavors of protein shakes/drinks that you enjoy with at least 15g of protein or more and 5g of carbohydrates or less.

## 2016-11-06 NOTE — Progress Notes (Signed)
Appt start time: 10:00 end time: 10:19  Assessment: 1st SWL Appointment.   Start Wt at NDES: 261.6 Wt: 257.5 BMI: 47.10   Pt arrives having lost 4.1 lbs from previous visit. Pt states she wants insurance to know that she is trying. Pt states she is limiting portion sizes, limiting concentrated sugars as well as bread. Pt reports that sweets are her "downfall". Pt reports consuming 3 meals a day. Pt states she sometimes drinks Premier Protein for lunch. Pt states she is still deciding between sleeve gastrectomy and RYGB. Pt is really nice lady and doing well with making lifestyle changes.    MEDICATIONS: See list   DIETARY INTAKE:  24-hr recall:  B ( AM): boiled egg, 2 strips of [place] bacon  Snk ( AM): none  L ( PM): baked chicken, stewed okra, turnip greens or Premier protein Snk ( PM): sometimes fruit D ( PM): baked chicken, broccoli, small amount of au gratin potatoes Snk ( PM): none Beverages: tea (half sweet/half unsweet), water, decaf coffee  Usual physical activity: water aerobics 2 hrs, 3x/wk  Diet to Follow: 1600 calories 180 g carbohydrates 120 g protein 44 g fat  Preferred Learning Style:   No preference indicated   Learning Readiness:   Ready  Change in progress  Nutritional Diagnosis:  -3.3 Overweight/obesity related to past poor dietary habits and physical inactivity as evidenced by patient w/ planned sleeve gastrectomy/RYGB surgery following dietary guidelines for continued weight loss.    Intervention:  Nutrition counseling for upcoming Bariatric Surgery. RD educated and counseled pt on Vitamin and Mineral Recommendations.   Goals:  - Find 1 or 2 more flavors of protein shakes/drinks that you enjoy with at least 15g of protein or more and 5g of carbohydrates or less.   Teaching Method Utilized:  Visual Auditory  Handouts given during visit include:  Vitamin and Mineral Recommendations  Barriers to learning/adherence to lifestyle change:  none  Demonstrated degree of understanding via:  Teach Back   Monitoring/Evaluation:  Dietary intake, exercise, and body weight in 1 month(s).

## 2016-11-07 ENCOUNTER — Ambulatory Visit: Payer: Medicare Other | Attending: Family Medicine | Admitting: Physical Therapy

## 2016-11-07 DIAGNOSIS — M546 Pain in thoracic spine: Secondary | ICD-10-CM | POA: Insufficient documentation

## 2016-11-09 ENCOUNTER — Ambulatory Visit: Payer: Medicare Other | Admitting: Rehabilitation

## 2016-11-09 ENCOUNTER — Encounter: Payer: Self-pay | Admitting: Rehabilitation

## 2016-11-09 DIAGNOSIS — M546 Pain in thoracic spine: Secondary | ICD-10-CM | POA: Diagnosis not present

## 2016-11-09 NOTE — Therapy (Signed)
[facility] [address], [place], [place], [address] Phone: [phone]   Fax:  [phone]  Physical Therapy Evaluation  Patient Details  Name: Beth Sullivan MRN: [id number] Date of Birth: 1945/10/30 Referring Provider: Leighton Ruff  Encounter Date: 11/09/2016      PT End of Session - 11/09/16 1145    Visit Number 1   Number of Visits 10   Date for PT Re-Evaluation 12/14/16   Authorization Type 10th visit update; Gcodes   PT Start Time 1102   Activity Tolerance Patient tolerated treatment well      Past Medical History:  Diagnosis Date  . Allergic rhinitis   . Chronic headaches   . Chronic pain disorder    From long-standing cervical spine disease - chronic pain management  . CKD (chronic kidney disease), stage II    Per PCP notes - Cr 0.9-1.3  . History of colon polyps 02/2014   Dr. Lizbeth Bark  . Hyperlipemia   . Hypertension   . Morbid obesity with BMI of 45.0-49.9, adult ([facility])   . Sleep apnea    uses a cpap - Dr. Annamaria Boots    Past Surgical History:  Procedure Laterality Date  . ABDOMINAL HYSTERECTOMY  1975   BSO  . BREAST LUMPECTOMY WITH NEEDLE LOCALIZATION Left 05/12/2013   Procedure: BREAST LUMPECTOMY WITH NEEDLE LOCALIZATION;  Surgeon: Marcello Moores A. Cornett, MD;  Location: [place];  Service: General;  Laterality: Left;  . COLONOSCOPY    . EYE SURGERY  2008   both cataracts  . FOOT SURGERY  08-2011   Left  . TUBAL LIGATION    . VESICOVAGINAL FISTULA CLOSURE W/ TAH      There were no vitals filed for this visit.       Subjective Assessment - 11/09/16 1104    Subjective Pt presents with R sided middle rib cage region pain after a fall backwards onto the floor.  Immediately had soreness after the fall and it has not gotten better or worse since the fall.  The pain is not usually there unless doing something that involves twisting and lifting.    Pertinent History HTN, lumbar  DDD and pain, knee OA   Diagnostic tests xray showing no fracture on the last appointment; upon questionning the xray seems to only be of the chest wall/ribs with no tspine included   Patient Stated Goals improve lifting, housework   Currently in Pain? No/denies   Pain Score 9   when it hurts   Pain Location Chest  follows along the rib cage   Pain Orientation Right   Pain Descriptors / Indicators Aching   Pain Type Chronic pain   Pain Onset More than a month ago   Pain Frequency Intermittent   Aggravating Factors  twisting, lifting   Pain Relieving Factors rest, avoiding the movements,             [facility] PT Assessment - 11/09/16 0001      Assessment   Medical Diagnosis chest wall injury   Referring Provider Leighton Ruff   Onset Date/Surgical Date 10/14/13   Next MD Visit unsure   Prior Therapy no     Precautions   Precautions None     Restrictions   Weight Bearing Restrictions No     Balance Screen   Has the patient fallen in the past 6 months No     [place] residence  Prior Function   Level of Independence Independent   Leisure helping with grandkids     Posture/Postural Control   Posture/Postural Control Postural limitations   Postural Limitations Rounded Shoulders;Forward head     ROM / Strength   AROM / PROM / Strength AROM;PROM;Strength     AROM   Overall AROM Comments thoracic seated: all mildly limited with pain during sidebending to the Right and rotation bilaterally,   SHOULDER: WNL, LUMBAR: all WNL but pain with sidebend and rotation bilateral  pain more near the spine     Strength   Overall Strength Comments UE strong and free      Palpation   Palpation comment pain with PA/springing of rib 9 and 10 (estimated due to soft tissue), tenderness to intercostals ribs 8-10, tenderness with CPA T10-12;  thoracic paraspinals tender T9-11            Objective measurements completed on examination:  See above findings.          [facility] Adult PT Treatment/Exercise - 11/09/16 0001      Modalities   Modalities Moist Heat     Moist Heat Therapy   Number Minutes Moist Heat 10 Minutes   Moist Heat Location Other (comment)  thoracic spine prone     Manual Therapy   Manual Therapy Soft tissue mobilization;Joint mobilization   Joint Mobilization CPAs grade IV- 2x30 T9-11, rib 10 PA    Soft tissue mobilization Prone to thoracic paraspinals T9-12, intercostals T9-11                PT Education - 11/09/16 1142    Education provided Yes   Education Details Diagnosis, POC,    Person(s) Educated Patient   Methods Explanation   Comprehension Verbalized understanding          PT Short Term Goals - 11/09/16 1156      PT SHORT TERM GOAL #1   Title pt will be ind with initial HEP   Time 2   Period Weeks   Status New   Target Date 11/23/16           PT Long Term Goals - 11/09/16 1157      PT LONG TERM GOAL #1   Title Pt will improve seated thoracic AROM to painfree   Time 5   Period Weeks   Status New   Target Date 12/14/16     PT LONG TERM GOAL #2   Title pt will report no thoracic pain or activity restrictions when playing with the grandchildren   Time 5   Period Weeks   Status New   Target Date 12/14/16     PT LONG TERM GOAL #3   Title pt will be independent with final HEP   Time 5   Period Weeks   Status New   Target Date 12/14/16                Plan - 11/09/16 1146    Clinical Impression Statement Pt presents with R sided pain originating from the thoracic spine near T10 and following the 10th rib into the anterior chest after a fall onto the back more on the R side.  Pain is reproduced with seated and standing thoracic/lumbar sidebending and rotation bilaterally and with palpation to the thoracic paraspinals and intercostals and with CPA pressure to the tspine approxinately T10 and with 10th rib springing.  (10th estimated today).  Rib  springing pain and ROM improved and treatment today. Pt  not given and HEP today and will assess effects of treatment today due to some irritability.     History and Personal Factors relevant to plan of care: HTN, knee OA, lumbar OA   Clinical Presentation Stable   Clinical Decision Making Low   Rehab Potential Good   PT Frequency 2x / week  1-2x per week   PT Duration --  5 weeks   PT Treatment/Interventions Moist Heat;Manual techniques;Therapeutic exercise;Neuromuscular re-education;Taping;Ultrasound;Dry needling;Patient/family education   PT Next Visit Plan assess MT effects and continue as tolerated, modaltities as needed, HEP education on thoracic stretching   Consulted and Agree with Plan of Care Patient      Patient will benefit from skilled therapeutic intervention in order to improve the following deficits and impairments:  Pain, Impaired UE functional use  Visit Diagnosis: Pain in thoracic spine - Plan: PT plan of care cert/re-cert      G-Codes - 45/85/92 1200    Functional Assessment Tool Used (Outpatient Only) PT judgement   Functional Limitation Carrying, moving and handling objects   Carrying, Moving and Handling Objects Current Status (T2446) At least 20 percent but less than 40 percent impaired, limited or restricted   Carrying, Moving and Handling Objects Goal Status (K8638) At least 1 percent but less than 20 percent impaired, limited or restricted       Problem List Patient Active Problem List   Diagnosis Date Noted  . Essential hypertension 08/26/2016  . Dyspnea on exertion 08/24/2016  . Atypical chest pain 08/24/2016  . Rapid heartbeat 08/24/2016  . Insomnia 05/13/2016  . Post-operative state 05/25/2013  . Acute bronchitis 09/21/2012  . Obstructive sleep apnea 05/29/2012    Stark Bray, DPT, CMP 11/09/2016, 12:03 PM  [facility] [address] [place], [place], [address] Phone:  [phone]   Fax:  [phone]  Name: Beth Sullivan MRN: [id number] Date of Birth: 09-29-1945

## 2016-11-09 NOTE — Patient Instructions (Signed)
Gentle Thoracic AROM until next visit to assess effects of treatment

## 2016-11-12 ENCOUNTER — Ambulatory Visit: Payer: Medicare Other | Admitting: Physical Therapy

## 2016-11-12 ENCOUNTER — Encounter: Payer: Self-pay | Admitting: Physical Therapy

## 2016-11-12 DIAGNOSIS — M546 Pain in thoracic spine: Secondary | ICD-10-CM | POA: Diagnosis not present

## 2016-11-12 NOTE — Therapy (Signed)
[facility] [address], [place], [place], [address] Phone: [phone]   Fax:  [phone]  Physical Therapy Treatment  Patient Details  Name: Beth Sullivan MRN: [id number] Date of Birth: 1945-05-23 Referring Provider: Leighton Ruff  Encounter Date: 11/12/2016      PT End of Session - 11/12/16 0816    Visit Number 2   Number of Visits 10   Date for PT Re-Evaluation 12/14/16   Authorization Type 10th visit update; Gcodes   PT Start Time 0801   Activity Tolerance Patient tolerated treatment well      Past Medical History:  Diagnosis Date  . Allergic rhinitis   . Chronic headaches   . Chronic pain disorder    From long-standing cervical spine disease - chronic pain management  . CKD (chronic kidney disease), stage II    Per PCP notes - Cr 0.9-1.3  . History of colon polyps 02/2014   Dr. Lizbeth Bark  . Hyperlipemia   . Hypertension   . Morbid obesity with BMI of 45.0-49.9, adult ([place])   . Sleep apnea    uses a cpap - Dr. Annamaria Boots    Past Surgical History:  Procedure Laterality Date  . ABDOMINAL HYSTERECTOMY  1975   BSO  . BREAST LUMPECTOMY WITH NEEDLE LOCALIZATION Left 05/12/2013   Procedure: BREAST LUMPECTOMY WITH NEEDLE LOCALIZATION;  Surgeon: Marcello Moores A. Cornett, MD;  Location: [place];  Service: General;  Laterality: Left;  . COLONOSCOPY    . EYE SURGERY  2008   both cataracts  . FOOT SURGERY  08-2011   Left  . TUBAL LIGATION    . VESICOVAGINAL FISTULA CLOSURE W/ TAH      There were no vitals filed for this visit.      Subjective Assessment - 11/12/16 0805    Subjective Pt states the massage helped a lot, she was a little sore the day after, but is feeling better now.   Pertinent History HTN, lumbar DDD and pain, knee OA   Diagnostic tests xray showing no fracture on the last appointment; upon questionning the xray seems to only be of the chest wall/ribs with no tspine included    Patient Stated Goals improve lifting, housework   Currently in Pain? No/denies                         [facility] Adult PT Treatment/Exercise - 11/12/16 0001      Exercises   Exercises Other Exercises     Lumbar Exercises: Stretches   Single Knee to Chest Stretch 5 reps;10 seconds   Lower Trunk Rotation 5 reps;10 seconds   Pelvic Tilt 5 reps  2 sets     Shoulder Exercises: Standing   Protraction AROM;20 reps  on wall   Extension Strengthening;Both;20 reps;Theraband  yellow   Row Strengthening;Both;20 reps;Theraband  yellow     Manual Therapy   Soft tissue mobilization side lying thoracic paraspinals                PT Education - 11/12/16 0844    Education provided Yes   Education Details pelvic tilt, thoracic and lumbar rotation   Person(s) Educated Patient   Methods Explanation;Handout;Verbal cues;Tactile cues;Demonstration   Comprehension Verbalized understanding;Returned demonstration          PT Short Term Goals - 11/12/16 0817      PT SHORT TERM GOAL #1   Title pt will be ind with initial  HEP   Time 2   Period Weeks   Status On-going           PT Long Term Goals - 11/12/16 0817      PT LONG TERM GOAL #1   Title Pt will improve seated thoracic AROM to painfree   Time 5   Period Weeks     PT LONG TERM GOAL #2   Title pt will report no thoracic pain or activity restrictions when playing with the grandchildren   Time 5   Period Weeks   Status On-going     PT LONG TERM GOAL #3   Title pt will be independent with final HEP   Time 5   Period Weeks   Status On-going               Plan - 11/12/16 2683    Clinical Impression Statement Patient had muscle spasms around thoracic spine that loosened with manual.  Pt had difficulty with pelvic tilts.  She has increased lumbar lordosis and continues to need skilled PT for improves strength and posture during funtional activities.   PT Treatment/Interventions Moist  Heat;Manual techniques;Therapeutic exercise;Neuromuscular re-education;Taping;Ultrasound;Dry needling;Patient/family education   PT Next Visit Plan review HEP, progress core and throracic mobility   Consulted and Agree with Plan of Care Patient      Patient will benefit from skilled therapeutic intervention in order to improve the following deficits and impairments:  Pain, Impaired UE functional use  Visit Diagnosis: Pain in thoracic spine     Problem List Patient Active Problem List   Diagnosis Date Noted  . Essential hypertension 08/26/2016  . Dyspnea on exertion 08/24/2016  . Atypical chest pain 08/24/2016  . Rapid heartbeat 08/24/2016  . Insomnia 05/13/2016  . Post-operative state 05/25/2013  . Acute bronchitis 09/21/2012  . Obstructive sleep apnea 05/29/2012    Zannie Cove, PT 11/12/2016, 8:57 AM  [facility] [address] [place], [place], [address] Phone: [phone]   Fax:  [phone]  Name: TALANA SLATTEN MRN: [id number] Date of Birth: 1945-06-19

## 2016-11-12 NOTE — Patient Instructions (Signed)
   POSTERIOR PELVIC TILT  Lie on your back on a firm surface with knees comfortably bent (top picture).  Then flatten back against the table while contracting abdominal muscles as if pulling belly button toward ribs (bottom picture).  Repeat 20x   Lumbar Rotation: Caudal - Bilateral (Supine)    Feet and knees together, arms outstretched, rotate knees left, turning head in opposite direction, until stretch is felt. Hold _10___ seconds. Relax. Repeat ___5_ times per set. Do __1__ sets per session. Do __1__ sessions per day.  http://orth.exer.us/1021   Copyright  VHI. All rights reserved.  Low Back Stretch: One leg (Supine)    Lying on back, bring one knee toward chest by pulling gently behind knee. Hold ___10_ seconds. Repeat with other leg. 5x each side  Copyright  VHI. All rights reserved.

## 2016-11-20 ENCOUNTER — Ambulatory Visit: Payer: Medicare Other | Attending: Family Medicine | Admitting: Rehabilitation

## 2016-11-20 ENCOUNTER — Encounter: Payer: Self-pay | Admitting: Rehabilitation

## 2016-11-20 DIAGNOSIS — M546 Pain in thoracic spine: Secondary | ICD-10-CM | POA: Diagnosis present

## 2016-11-20 NOTE — Therapy (Signed)
[facility] [address] [place], [place], [address] Phone: [phone]   Fax:  [phone]  Physical Therapy Treatment  Patient Details  Name: Beth Sullivan MRN: [id number] Date of Birth: 02-Aug-1945 Referring Provider: Leighton Ruff  Encounter Date: 11/20/2016      PT End of Session - 11/20/16 0824    Visit Number 3   Number of Visits 10   Date for PT Re-Evaluation 12/14/16   PT Start Time 0801   PT Stop Time 0840   PT Time Calculation (min) 39 min   Activity Tolerance Patient tolerated treatment well      Past Medical History:  Diagnosis Date  . Allergic rhinitis   . Chronic headaches   . Chronic pain disorder    From long-standing cervical spine disease - chronic pain management  . CKD (chronic kidney disease), stage II    Per PCP notes - Cr 0.9-1.3  . History of colon polyps 02/2014   Dr. Lizbeth Bark  . Hyperlipemia   . Hypertension   . Morbid obesity with BMI of 45.0-49.9, adult ([place])   . Sleep apnea    uses a cpap - Dr. Annamaria Boots    Past Surgical History:  Procedure Laterality Date  . ABDOMINAL HYSTERECTOMY  1975   BSO  . BREAST LUMPECTOMY WITH NEEDLE LOCALIZATION Left 05/12/2013   Procedure: BREAST LUMPECTOMY WITH NEEDLE LOCALIZATION;  Surgeon: Marcello Moores A. Cornett, MD;  Location: [place];  Service: General;  Laterality: Left;  . COLONOSCOPY    . EYE SURGERY  2008   both cataracts  . FOOT SURGERY  08-2011   Left  . TUBAL LIGATION    . VESICOVAGINAL FISTULA CLOSURE W/ TAH      There were no vitals filed for this visit.      Subjective Assessment - 11/20/16 0800    Subjective I'm feeling good overall, but fell down the bottom 2 steps today.  Reports no injury.  Reports that the pulleys last visit aggravated the back, but everything else was fine.  Has been having the same pain but not lasting as long.     Currently in Pain? No/denies                          [facility] Adult PT Treatment/Exercise - 11/20/16 0001      Lumbar Exercises: Stretches   Lower Trunk Rotation 5 reps;10 seconds   Pelvic Tilt 5 reps     Shoulder Exercises: Standing   External Rotation Both;Theraband;Strengthening   Theraband Level (Shoulder External Rotation) Level 1 (Yellow)   Extension Strengthening;Both;20 reps;Theraband   Theraband Level (Shoulder Extension) Level 1 (Yellow)   Row Strengthening;Both;15 reps;Theraband   Theraband Level (Shoulder Row) Level 1 (Yellow)     Shoulder Exercises: Stretch   Other Shoulder Stretches standing at staircase mid thoracic stretch 3x15"   Other Shoulder Stretches seated R thoracic body stretch 3x20"      Moist Heat Therapy   Number Minutes Moist Heat 5 Minutes   Moist Heat Location Other (comment)     Manual Therapy   Joint Mobilization CPAs grade IV- 2x30 T9-11, rib 10 PA    Soft tissue mobilization side lying thoracic paraspinals                  PT Short Term Goals - 11/12/16 0817      PT SHORT TERM GOAL #1   Title pt will be  ind with initial HEP   Time 2   Period Weeks   Status On-going           PT Long Term Goals - 11/12/16 0817      PT LONG TERM GOAL #1   Title Pt will improve seated thoracic AROM to painfree   Time 5   Period Weeks     PT LONG TERM GOAL #2   Title pt will report no thoracic pain or activity restrictions when playing with the grandchildren   Time 5   Period Weeks   Status On-going     PT LONG TERM GOAL #3   Title pt will be independent with final HEP   Time 5   Period Weeks   Status On-going               Plan - 11/20/16 6808    Clinical Impression Statement Pt reporting decreasing pain intensity and duration but still with the same pain.  continues with ttp thoracic paraspinals near T10 but not as much pain with CPAs today.  Reported "pulleys'" were painful last time but did not see that on the flow sheet   PT Treatment/Interventions Moist Heat;Manual  techniques;Therapeutic exercise;Neuromuscular re-education;Taping;Ultrasound;Dry needling;Patient/family education   PT Next Visit Plan progress core and thoracic mobility, STM, modalities , taping?      Patient will benefit from skilled therapeutic intervention in order to improve the following deficits and impairments:  Pain, Impaired UE functional use  Visit Diagnosis: Pain in thoracic spine     Problem List Patient Active Problem List   Diagnosis Date Noted  . Essential hypertension 08/26/2016  . Dyspnea on exertion 08/24/2016  . Atypical chest pain 08/24/2016  . Rapid heartbeat 08/24/2016  . Insomnia 05/13/2016  . Post-operative state 05/25/2013  . Acute bronchitis 09/21/2012  . Obstructive sleep apnea 05/29/2012    Stark Bray, DPT, CMP 11/20/2016, 8:42 AM  [facility] [address], [place], [place], [address] Phone: [phone]   Fax:  [phone]  Name: Beth Sullivan MRN: [id number] Date of Birth: [birth year]

## 2016-11-22 ENCOUNTER — Encounter: Payer: Self-pay | Admitting: Rehabilitation

## 2016-11-22 ENCOUNTER — Ambulatory Visit: Payer: Medicare Other | Admitting: Rehabilitation

## 2016-11-22 DIAGNOSIS — M546 Pain in thoracic spine: Secondary | ICD-10-CM

## 2016-11-22 NOTE — Therapy (Signed)
[facility] [address] [place], [place], [address] Phone: [phone]   Fax:  [phone]  Physical Therapy Treatment  Patient Details  Name: Beth Sullivan MRN: [id number] Date of Birth: Apr 13, 1946 Referring Provider: Leighton Ruff  Encounter Date: 11/22/2016      PT End of Session - 11/22/16 0831    Visit Number 4   Number of Visits 10   Date for PT Re-Evaluation 12/14/16   Authorization Type 10th visit update; Gcodes   PT Start Time 0802   PT Stop Time 0842   PT Time Calculation (min) 40 min   Activity Tolerance Patient tolerated treatment well      Past Medical History:  Diagnosis Date  . Allergic rhinitis   . Chronic headaches   . Chronic pain disorder    From long-standing cervical spine disease - chronic pain management  . CKD (chronic kidney disease), stage II    Per PCP notes - Cr 0.9-1.3  . History of colon polyps 02/2014   Dr. Lizbeth Bark  . Hyperlipemia   . Hypertension   . Morbid obesity with BMI of 45.0-49.9, adult ([place])   . Sleep apnea    uses a cpap - Dr. Annamaria Boots    Past Surgical History:  Procedure Laterality Date  . ABDOMINAL HYSTERECTOMY  1975   BSO  . BREAST LUMPECTOMY WITH NEEDLE LOCALIZATION Left 05/12/2013   Procedure: BREAST LUMPECTOMY WITH NEEDLE LOCALIZATION;  Surgeon: Marcello Moores A. Cornett, MD;  Location: [place];  Service: General;  Laterality: Left;  . COLONOSCOPY    . EYE SURGERY  2008   both cataracts  . FOOT SURGERY  08-2011   Left  . TUBAL LIGATION    . VESICOVAGINAL FISTULA CLOSURE W/ TAH      There were no vitals filed for this visit.      Subjective Assessment - 11/22/16 0802    Subjective no soreness from the fall.  The back was a bit sore tuesday but after doing the water workout it was better.     Currently in Pain? No/denies  5/10 on tuesday following the rib into the front                         [facility] Adult PT  Treatment/Exercise - 11/22/16 0001      Exercises   Other Exercises  seated thoracic rotation stretch 2x20" bil, open book bil 3x15"      Shoulder Exercises: Supine   Horizontal ABduction Strengthening;Both;15 reps;Theraband   Theraband Level (Shoulder Horizontal ABduction) Level 2 (Red)   Other Supine Exercises supine D2 pulls x10bil     Moist Heat Therapy   Number Minutes Moist Heat 5 Minutes   Moist Heat Location Other (comment)     Manual Therapy   Manual Therapy Taping   Joint Mobilization CPAs grade IV- 2x30 T9-11, rib 10 PA    Soft tissue mobilization side lying thoracic paraspinals   Kinesiotex Inhibit Muscle     Kinesiotix   Inhibit Muscle  2 I strips; 1st along thoracic paraspinals in flexion with 50% stretch; 2nd along 10th rib (under braline) in L thoracic rotation                  PT Short Term Goals - 11/22/16 7026      PT SHORT TERM GOAL #1   Title pt will be ind with initial HEP   Status Achieved  PT Long Term Goals - 11/12/16 0817      PT LONG TERM GOAL #1   Title Pt will improve seated thoracic AROM to painfree   Time 5   Period Weeks     PT LONG TERM GOAL #2   Title pt will report no thoracic pain or activity restrictions when playing with the grandchildren   Time 5   Period Weeks   Status On-going     PT LONG TERM GOAL #3   Title pt will be independent with final HEP   Time 5   Period Weeks   Status On-going               Plan - 11/22/16 0831    Clinical Impression Statement Less pain after last visit and elimination of pain with water exercise.  Attempted KT today to inhibit muscle and decrease pain. Continues to have less pain with manual work.     PT Treatment/Interventions Moist Heat;Manual techniques;Therapeutic exercise;Neuromuscular re-education;Taping;Ultrasound;Dry needling;Patient/family education   PT Next Visit Plan progress core and thoracic mobility, STM, modalities , taping effects?, Needling  thoracic spine?      Patient will benefit from skilled therapeutic intervention in order to improve the following deficits and impairments:  Pain, Impaired UE functional use  Visit Diagnosis: Pain in thoracic spine     Problem List Patient Active Problem List   Diagnosis Date Noted  . Essential hypertension 08/26/2016  . Dyspnea on exertion 08/24/2016  . Atypical chest pain 08/24/2016  . Rapid heartbeat 08/24/2016  . Insomnia 05/13/2016  . Post-operative state 05/25/2013  . Acute bronchitis 09/21/2012  . Obstructive sleep apnea 05/29/2012    Stark Bray DPT, CMP 11/22/2016, 8:41 AM  [facility] [address] [place], [place], [address] Phone: [phone]   Fax:  [phone]  Name: Beth Sullivan MRN: [id number] Date of Birth: 1945/06/13

## 2016-11-26 ENCOUNTER — Ambulatory Visit: Payer: Medicare Other | Admitting: Rehabilitation

## 2016-11-26 ENCOUNTER — Encounter: Payer: Self-pay | Admitting: Rehabilitation

## 2016-11-26 DIAGNOSIS — M546 Pain in thoracic spine: Secondary | ICD-10-CM

## 2016-11-26 NOTE — Therapy (Signed)
[facility] [address], [place], [place], [address] Phone: [phone]   Fax:  [phone]  Physical Therapy Treatment  Patient Details  Name: Beth Sullivan MRN: [id number] Date of Birth: Mar 16, 1946 Referring Provider: Leighton Ruff  Encounter Date: 11/26/2016      PT End of Session - 11/26/16 0821    Visit Number 5   Number of Visits 10   Date for PT Re-Evaluation 12/14/16   Authorization Type 10th visit update; [employer]   PT Start Time 0800   PT Stop Time 0840   PT Time Calculation (min) 40 min   Activity Tolerance Patient tolerated treatment well      Past Medical History:  Diagnosis Date  . Allergic rhinitis   . Chronic headaches   . Chronic pain disorder    From long-standing cervical spine disease - chronic pain management  . CKD (chronic kidney disease), stage II    Per PCP notes - Cr 0.9-1.3  . History of colon polyps 02/2014   Dr. Lizbeth Bark  . Hyperlipemia   . Hypertension   . Morbid obesity with BMI of 45.0-49.9, adult ([place])   . Sleep apnea    uses a cpap - Dr. Annamaria Boots    Past Surgical History:  Procedure Laterality Date  . ABDOMINAL HYSTERECTOMY  1975   BSO  . BREAST LUMPECTOMY WITH NEEDLE LOCALIZATION Left 05/12/2013   Procedure: BREAST LUMPECTOMY WITH NEEDLE LOCALIZATION;  Surgeon: Marcello Moores A. Cornett, MD;  Location: [place];  Service: General;  Laterality: Left;  . COLONOSCOPY    . EYE SURGERY  2008   both cataracts  . FOOT SURGERY  08-2011   Left  . TUBAL LIGATION    . VESICOVAGINAL FISTULA CLOSURE W/ TAH      There were no vitals filed for this visit.      Subjective Assessment - 11/26/16 0801    Subjective I haven't had any pain since last visit except lower back pain.  Liked the tape.  Was even on her feet alot saturday.     Currently in Pain? No/denies            [facility] PT Assessment - 11/26/16 0001      AROM   Overall AROM Comments thoracic seated:  all mildly limited with pain only with rotation to the right (2/10)                     [facility] Adult PT Treatment/Exercise - 11/26/16 0001      Lumbar Exercises: Supine   Ab Set 5 reps;5 seconds   Bent Knee Raise 10 reps;2 seconds   Bridge 10 reps   Straight Leg Raise 5 reps     Shoulder Exercises: Stretch   Other Shoulder Stretches open book L sidelying 3x20"   Other Shoulder Stretches seated R thoracic body stretch and seated rotation stretch 3x20"      Manual Therapy   Joint Mobilization CPAs grade IV- 2x30 T9-11, rib 10 PA    Soft tissue mobilization side lying thoracic paraspinals   Kinesiotex Inhibit Muscle     Kinesiotix   Inhibit Muscle  2 I strips; 1st along thoracic paraspinals in flexion with 50% stretch; 2nd along 10th rib (under braline) in L thoracic rotation                PT Education - 11/26/16 1324    Education provided Yes   Education Details updated HEP  Person(s) Educated Patient   Methods Explanation;Demonstration;Handout   Comprehension Verbalized understanding          PT Short Term Goals - 11/22/16 3976      PT SHORT TERM GOAL #1   Title pt will be ind with initial HEP   Status Achieved           PT Long Term Goals - 11/26/16 7341      PT LONG TERM GOAL #1   Title Pt will improve seated thoracic AROM to painfree   Status Partially Met     PT LONG TERM GOAL #2   Title pt will report no thoracic pain or activity restrictions when playing with the grandchildren   Status Partially Met     PT LONG TERM GOAL #3   Title pt will be independent with final HEP   Status Partially Met     PT LONG TERM GOAL #4   Title pt will be able to make the bed without increased pain   Time 2   Period Weeks   Status New   Target Date 12/10/16               Plan - 11/26/16 9379    Clinical Impression Statement Pt much improved today.  improved AROM with pain only end range thoracic rotation Right.  No sig ttp during  manual work.  Will attempt to make the bed at home to see if the pain is truly decreasing   PT Frequency 2x / week   PT Duration --  5 weeks   PT Treatment/Interventions Moist Heat;Manual techniques;Therapeutic exercise;Neuromuscular re-education;Taping;Ultrasound;Dry needling;Patient/family education   PT Next Visit Plan progress core and thoracic mobility, STM, modalities , taping effects?, Needling thoracic spine?   PT Home Exercise Plan given thoracic rotation stretches 8/13      Patient will benefit from skilled therapeutic intervention in order to improve the following deficits and impairments:  Pain, Impaired UE functional use  Visit Diagnosis: Pain in thoracic spine     Problem List Patient Active Problem List   Diagnosis Date Noted  . Essential hypertension 08/26/2016  . Dyspnea on exertion 08/24/2016  . Atypical chest pain 08/24/2016  . Rapid heartbeat 08/24/2016  . Insomnia 05/13/2016  . Post-operative state 05/25/2013  . Acute bronchitis 09/21/2012  . Obstructive sleep apnea 05/29/2012    Stark Bray, DPT, CMP 11/26/2016, 8:41 AM  [facility] [address] [place], [place], [address] Phone: [phone]   Fax:  [phone]  Name: Beth Sullivan MRN: [id number] Date of Birth: 1945/10/28

## 2016-11-29 ENCOUNTER — Ambulatory Visit: Payer: Medicare Other | Admitting: Physical Therapy

## 2016-11-30 ENCOUNTER — Encounter: Payer: Self-pay | Admitting: Physical Therapy

## 2016-11-30 ENCOUNTER — Ambulatory Visit: Payer: Medicare Other | Admitting: Physical Therapy

## 2016-11-30 DIAGNOSIS — M546 Pain in thoracic spine: Secondary | ICD-10-CM | POA: Diagnosis not present

## 2016-11-30 NOTE — Therapy (Signed)
[facility] [address], [place], [place], [address] Phone: [phone]   Fax:  [phone]  Physical Therapy Treatment  Patient Details  Name: Beth Sullivan MRN: [id number] Date of Birth: 1945/09/22 Referring Provider: Leighton Ruff  Encounter Date: 11/30/2016      PT End of Session - 11/30/16 0809    Visit Number 6   Number of Visits 10   Date for PT Re-Evaluation 12/14/16   Authorization Type 10th visit update; Griselda Miner   PT Start Time 0808   PT Stop Time 0854   PT Time Calculation (min) 46 min   Activity Tolerance Patient tolerated treatment well      Past Medical History:  Diagnosis Date  . Allergic rhinitis   . Chronic headaches   . Chronic pain disorder    From long-standing cervical spine disease - chronic pain management  . CKD (chronic kidney disease), stage II    Per PCP notes - Cr 0.9-1.3  . History of colon polyps 02/2014   Dr. Lizbeth Bark  . Hyperlipemia   . Hypertension   . Morbid obesity with BMI of 45.0-49.9, adult ([place])   . Sleep apnea    uses a cpap - Dr. Annamaria Boots    Past Surgical History:  Procedure Laterality Date  . ABDOMINAL HYSTERECTOMY  1975   BSO  . BREAST LUMPECTOMY WITH NEEDLE LOCALIZATION Left 05/12/2013   Procedure: BREAST LUMPECTOMY WITH NEEDLE LOCALIZATION;  Surgeon: Marcello Moores A. Cornett, MD;  Location: [place];  Service: General;  Laterality: Left;  . COLONOSCOPY    . EYE SURGERY  2008   both cataracts  . FOOT SURGERY  08-2011   Left  . TUBAL LIGATION    . VESICOVAGINAL FISTULA CLOSURE W/ TAH      There were no vitals filed for this visit.      Subjective Assessment - 11/30/16 0811    Subjective Reaching wasn't bothering me since last treatment.  Making the bed bothers my lower back but not the upper back.  This morning I feel fine.  Sometimes if I fall asleep propped up.  The taping feels good.   Pertinent History HTN, lumbar DDD and pain, knee OA    Diagnostic tests xray showing no fracture on the last appointment; upon questionning the xray seems to only be of the chest wall/ribs with no tspine included   Patient Stated Goals improve lifting, housework   Currently in Pain? No/denies                         [facility] Adult PT Treatment/Exercise - 11/30/16 0001      Shoulder Exercises: Supine   Other Supine Exercises supine D2, flex/ext, horizontal abduction - 10x each   Other Supine Exercises right UE horizontal abd/add - 20x     Shoulder Exercises: Sidelying   Other Sidelying Exercises open book 10x each way     Manual Therapy   Joint Mobilization rib 10 PA   Soft tissue mobilization side lying thoracic paraspinals, intercostals 8-10   Kinesiotex Inhibit Muscle     Kinesiotix   Inhibit Muscle  2 I strips; 1st along thoracic paraspinals in flexion with 50% stretch; 2nd along 10th rib (under braline) in L thoracic rotation                  PT Short Term Goals - 11/22/16 0832      PT SHORT TERM GOAL #1  Title pt will be ind with initial HEP   Status Achieved           PT Long Term Goals - 11/30/16 0858      PT LONG TERM GOAL #1   Title Pt will improve seated thoracic AROM to painfree   Time 5   Period Weeks   Status Partially Met     PT LONG TERM GOAL #2   Title pt will report no thoracic pain or activity restrictions when playing with the grandchildren   Time 5   Period Weeks   Status Partially Met     PT LONG TERM GOAL #3   Title pt will be independent with final HEP   Time 5   Period Weeks   Status Partially Met     PT LONG TERM GOAL #4   Title pt will be able to make the bed without increased pain   Baseline no thoracic pain   Time 2   Period Weeks   Status On-going               Plan - 11/30/16 0816    Clinical Impression Statement Patient able to do AROM exercises without pain today, just "a little pull" when doing open book to the right.  Patient continues to  be able to perform more activities at home and is now complaining more of low back pain which she had previous to this thoracic pain.  Pt needs cues during exercises to engage core and maintain neutral trunk alignment   PT Treatment/Interventions Moist Heat;Manual techniques;Therapeutic exercise;Neuromuscular re-education;Taping;Ultrasound;Dry needling;Patient/family education   PT Next Visit Plan progress core and thoracic mobility and strength, standing core and scapular stability exercises, rotation with gentle strengthening if tolerated, STM to thoracic and intercostals and taping as needed   PT Home Exercise Plan add some core/UE strengthening with band next   Consulted and Agree with Plan of Care Patient      Patient will benefit from skilled therapeutic intervention in order to improve the following deficits and impairments:  Pain, Impaired UE functional use  Visit Diagnosis: Pain in thoracic spine     Problem List Patient Active Problem List   Diagnosis Date Noted  . Essential hypertension 08/26/2016  . Dyspnea on exertion 08/24/2016  . Atypical chest pain 08/24/2016  . Rapid heartbeat 08/24/2016  . Insomnia 05/13/2016  . Post-operative state 05/25/2013  . Acute bronchitis 09/21/2012  . Obstructive sleep apnea 05/29/2012    Wayne Both 11/30/2016, 9:14 AM  [facility] [address], [place], [place], [address] Phone: [phone]   Fax:  [phone]  Name: Beth Sullivan MRN: [id number] Date of Birth: 07-28-1945

## 2016-12-04 ENCOUNTER — Ambulatory Visit: Payer: Self-pay | Admitting: Registered"

## 2016-12-05 ENCOUNTER — Encounter: Payer: Medicare Other | Attending: Surgery | Admitting: Registered"

## 2016-12-05 ENCOUNTER — Encounter: Payer: Self-pay | Admitting: Registered"

## 2016-12-05 ENCOUNTER — Ambulatory Visit: Payer: Medicare Other | Admitting: Physical Therapy

## 2016-12-05 DIAGNOSIS — Z6841 Body Mass Index (BMI) 40.0 and over, adult: Secondary | ICD-10-CM | POA: Insufficient documentation

## 2016-12-05 DIAGNOSIS — M546 Pain in thoracic spine: Secondary | ICD-10-CM

## 2016-12-05 DIAGNOSIS — Z713 Dietary counseling and surveillance: Secondary | ICD-10-CM | POA: Insufficient documentation

## 2016-12-05 DIAGNOSIS — E669 Obesity, unspecified: Secondary | ICD-10-CM

## 2016-12-05 NOTE — Therapy (Signed)
[facility] [address], [place], [place], [address] Phone: [phone]   Fax:  [phone]  Physical Therapy Treatment  Patient Details  Name: Beth Sullivan MRN: [id number] Date of Birth: 1945-11-11 Referring Provider: Leighton Ruff  Encounter Date: 12/05/2016      PT End of Session - 12/05/16 1521    Visit Number 7   Number of Visits 10   Date for PT Re-Evaluation 12/14/16   Authorization Type 10th visit update; [employer]   PT Start Time 1440   PT Stop Time 1521   PT Time Calculation (min) 41 min   Activity Tolerance Patient tolerated treatment well   Behavior During Therapy [facility] for tasks assessed/performed      Past Medical History:  Diagnosis Date  . Allergic rhinitis   . Chronic headaches   . Chronic pain disorder    From long-standing cervical spine disease - chronic pain management  . CKD (chronic kidney disease), stage II    Per PCP notes - Cr 0.9-1.3  . History of colon polyps 02/2014   Dr. Lizbeth Bark  . Hyperlipemia   . Hypertension   . Morbid obesity with BMI of 45.0-49.9, adult ([place])   . Sleep apnea    uses a cpap - Dr. Annamaria Boots    Past Surgical History:  Procedure Laterality Date  . ABDOMINAL HYSTERECTOMY  1975   BSO  . BREAST LUMPECTOMY WITH NEEDLE LOCALIZATION Left 05/12/2013   Procedure: BREAST LUMPECTOMY WITH NEEDLE LOCALIZATION;  Surgeon: Marcello Moores A. Cornett, MD;  Location: [place];  Service: General;  Laterality: Left;  . COLONOSCOPY    . EYE SURGERY  2008   both cataracts  . FOOT SURGERY  08-2011   Left  . TUBAL LIGATION    . VESICOVAGINAL FISTULA CLOSURE W/ TAH      There were no vitals filed for this visit.      Subjective Assessment - 12/05/16 1440    Subjective feels great; she had an epidural injection in low back yesterday and now all pain is resolved including thoracic spine and knees.   Patient Stated Goals improve lifting, housework   Currently in  Pain? No/denies                         [facility] Adult PT Treatment/Exercise - 12/05/16 1443      Lumbar Exercises: Standing   Wall Slides 10 reps;5 seconds     Shoulder Exercises: Supine   Horizontal ABduction Both;15 reps;Theraband   Theraband Level (Shoulder Horizontal ABduction) Level 3 (Green)   External Rotation Both;15 reps;Theraband   Theraband Level (Shoulder External Rotation) Level 3 (Green)   Other Supine Exercises D2 flex/ext x 15 bil with green theraband     Shoulder Exercises: Sidelying   Other Sidelying Exercises open book 10x each way     Shoulder Exercises: Standing   Row Strengthening;15 reps;Weights   Row Weight (lbs) 25   Row Limitations overhead row x 15 with 25#   Other Standing Exercises standing trunk rotation with 15# bil     Shoulder Exercises: Pulleys   Flexion 3 minutes   Flexion Limitations with stretch at end     Shoulder Exercises: ROM/Strengthening   UBE (Upper Arm Bike) L1 x 6 min (3' fwd/3' bwd)                  PT Short Term Goals - 11/22/16 2233  PT SHORT TERM GOAL #1   Title pt will be ind with initial HEP   Status Achieved           PT Long Term Goals - 11/30/16 0858      PT LONG TERM GOAL #1   Title Pt will improve seated thoracic AROM to painfree   Time 5   Period Weeks   Status Partially Met     PT LONG TERM GOAL #2   Title pt will report no thoracic pain or activity restrictions when playing with the grandchildren   Time 5   Period Weeks   Status Partially Met     PT LONG TERM GOAL #3   Title pt will be independent with final HEP   Time 5   Period Weeks   Status Partially Met     PT LONG TERM GOAL #4   Title pt will be able to make the bed without increased pain   Baseline no thoracic pain   Time 2   Period Weeks   Status On-going               Plan - 12/05/16 1522    Clinical Impression Statement Pt reports no pain since getting lumbar injection yesterday, and  tolerated strengthening exercises well today without increase in pain.  Anticipate if pt continues to be pain free will be able to d/c next week.   PT Treatment/Interventions Moist Heat;Manual techniques;Therapeutic exercise;Neuromuscular re-education;Taping;Ultrasound;Dry needling;Patient/family education   PT Next Visit Plan assess pain and response to exercise next week; consider d/c if pain free   Consulted and Agree with Plan of Care Patient      Patient will benefit from skilled therapeutic intervention in order to improve the following deficits and impairments:     Visit Diagnosis: Pain in thoracic spine     Problem List Patient Active Problem List   Diagnosis Date Noted  . Essential hypertension 08/26/2016  . Dyspnea on exertion 08/24/2016  . Atypical chest pain 08/24/2016  . Rapid heartbeat 08/24/2016  . Insomnia 05/13/2016  . Post-operative state 05/25/2013  . Acute bronchitis 09/21/2012  . Obstructive sleep apnea 05/29/2012       Laureen Abrahams, PT, DPT 12/05/16 3:23 PM     [facility] [address] [place], [place], [address] Phone: [phone]   Fax:  [phone]  Name: KIRSTIN KUGLER MRN: [id number] Date of Birth: May 15, 1945

## 2016-12-05 NOTE — Patient Instructions (Addendum)
-   Find 1 or 2 more flavors of protein shakes/drinks that you enjoy with at least 15g of protein or more and 5g of carbohydrates or less.   - Continue to chew well; at least 30 times per bite.   - Aim to keep snacks with you especially when on-the-go such as nuts, low-fat/reduced fat cheese, or fruit.

## 2016-12-05 NOTE — Progress Notes (Signed)
Appt start time: 9:45 end time: 10:00  Assessment: 2nd SWL Appointment.   Start Wt at NDES: 261.6 Wt: 257.5, 252.9 BMI: 46.26   Pt arrives having lost 4.6 lbs from previous visit. Pt states she wants to be less than 250 lbs by next visit. Pt states she has tried 2 protein shake options and likes them. Pt states she will work on finding 2 more flavors that she enjoys. Pt states she is getting 64 oz daily and has reduced portion sizes since last visit.   Pt states she is still deciding between sleeve gastrectomy and RYGB. Pt is really nice lady and doing well with making lifestyle changes.    MEDICATIONS: See list   DIETARY INTAKE:  24-hr recall:  B ( AM): boiled egg, 2 strips of [place] bacon or protein shake Snk ( AM): none  L ( PM): baked chicken, stewed okra, turnip greens or Premier protein Snk ( PM): sometimes fruit D ( PM): [employer]-chicken sandwich  Snk ( PM): none Beverages: tea (75% unsweet/25% sweet), water, decaf coffee  Usual physical activity: water aerobics 2 hrs, 3x/wk  Diet to Follow: 1600 calories 180 g carbohydrates 120 g protein 44 g fat  Preferred Learning Style:   No preference indicated   Learning Readiness:   Ready  Change in progress  Nutritional Diagnosis:  [place]-3.3 Overweight/obesity related to past poor dietary habits and physical inactivity as evidenced by patient w/ planned sleeve gastrectomy/RYGB surgery following dietary guidelines for continued weight loss.    Intervention:  Nutrition counseling for upcoming Bariatric Surgery. RD educated and counseled pt on Vitamin and Mineral Recommendations.   Goals:  - Find 1 or 2 more flavors of protein shakes/drinks that you enjoy with at least 15g of protein or more and 5g of carbohydrates or less.  - Continue to chew well; at least 30 times per bite.  - Aim to keep snacks with you especially when on-the-go such as nuts, low-fat/reduced fat cheese, or fruit.    Teaching Method Utilized:   Visual Auditory  Handouts given during visit include:  none  Barriers to learning/adherence to lifestyle change: none  Demonstrated degree of understanding via:  Teach Back   Monitoring/Evaluation:  Dietary intake, exercise, and body weight in 1 month(s).

## 2016-12-11 ENCOUNTER — Encounter: Payer: Self-pay | Admitting: Physical Therapy

## 2016-12-11 ENCOUNTER — Ambulatory Visit: Payer: Medicare Other | Admitting: Physical Therapy

## 2016-12-11 DIAGNOSIS — M546 Pain in thoracic spine: Secondary | ICD-10-CM

## 2016-12-11 NOTE — Therapy (Signed)
[facility] [address] [place], [place], [address] Phone: [phone]   Fax:  [phone]  Physical Therapy Treatment  Patient Details  Name: Beth Sullivan MRN: [id number] Date of Birth: Aug 31, 1945 Referring Provider: Leighton Ruff  Encounter Date: 12/11/2016      PT End of Session - 12/11/16 0848    Visit Number 8   Number of Visits 10   Date for PT Re-Evaluation 12/14/16   Authorization Type 10th visit update; Griselda Miner   PT Start Time 9326   PT Stop Time 0928   PT Time Calculation (min) 42 min   Activity Tolerance Patient tolerated treatment well   Behavior During Therapy [facility] for tasks assessed/performed      Past Medical History:  Diagnosis Date  . Allergic rhinitis   . Chronic headaches   . Chronic pain disorder    From long-standing cervical spine disease - chronic pain management  . CKD (chronic kidney disease), stage II    Per PCP notes - Cr 0.9-1.3  . History of colon polyps 02/2014   Dr. Lizbeth Bark  . Hyperlipemia   . Hypertension   . Morbid obesity with BMI of 45.0-49.9, adult ([place])   . Sleep apnea    uses a cpap - Dr. Annamaria Boots    Past Surgical History:  Procedure Laterality Date  . ABDOMINAL HYSTERECTOMY  1975   BSO  . BREAST LUMPECTOMY WITH NEEDLE LOCALIZATION Left 05/12/2013   Procedure: BREAST LUMPECTOMY WITH NEEDLE LOCALIZATION;  Surgeon: Marcello Moores A. Cornett, MD;  Location: [place];  Service: General;  Laterality: Left;  . COLONOSCOPY    . EYE SURGERY  2008   both cataracts  . FOOT SURGERY  08-2011   Left  . TUBAL LIGATION    . VESICOVAGINAL FISTULA CLOSURE W/ TAH      There were no vitals filed for this visit.      Subjective Assessment - 12/11/16 0852    Subjective I have done things I haven't been able to.  No limitations and I am doing everything now including making the bed without pain.   Diagnostic tests xray showing no fracture on the last appointment;  upon questionning the xray seems to only be of the chest wall/ribs with no tspine included   Currently in Pain? No/denies                         [facility] Adult PT Treatment/Exercise - 12/11/16 0001      Lumbar Exercises: Aerobic   Stationary Bike Nustep L1 x 6 min  PT discussing discharge plans with patient     Lumbar Exercises: Standing   Wall Slides 10 reps;5 seconds     Shoulder Exercises: Supine   Horizontal ABduction Both;15 reps;Theraband   Theraband Level (Shoulder Horizontal ABduction) Level 3 (Green)   External Rotation Both;15 reps;Theraband   Theraband Level (Shoulder External Rotation) Level 3 (Green)   Other Supine Exercises D2 flex/ext x 15 bil with green theraband     Shoulder Exercises: Sidelying   Other Sidelying Exercises open book 10x each way     Shoulder Exercises: Standing   Row Strengthening;Weights;20 reps   Row Weight (lbs) 25   Row Limitations overhead row x 20 with 25#   Other Standing Exercises standing trunk rotation with 15# bil 15x concentric; 15x isometric side step     Shoulder Exercises: Pulleys   Flexion --   Flexion Limitations --  Shoulder Exercises: ROM/Strengthening   UBE (Upper Arm Bike) --                  PT Short Term Goals - 11/22/16 2979      PT SHORT TERM GOAL #1   Title pt will be ind with initial HEP   Status Achieved           PT Long Term Goals - 12-24-2016 0849      PT LONG TERM GOAL #1   Title Pt will improve seated thoracic AROM to painfree   Time 5   Period Weeks   Status Achieved     PT LONG TERM GOAL #2   Title pt will report no thoracic pain or activity restrictions when playing with the grandchildren   Time 5   Period Weeks   Status Achieved     PT LONG TERM GOAL #3   Title pt will be independent with final HEP   Time 5   Period Weeks   Status Achieved     PT LONG TERM GOAL #4   Title pt will be able to make the bed without increased pain   Status Achieved                Plan - 12-24-16 0851    Clinical Impression Statement Patient is having no pain and independent with HEP.  She has met all long term goals and will discharge with HEP today.   PT Treatment/Interventions Moist Heat;Manual techniques;Therapeutic exercise;Neuromuscular re-education;Taping;Ultrasound;Dry needling;Patient/family education   PT Next Visit Plan discharged today   Consulted and Agree with Plan of Care Patient      Patient will benefit from skilled therapeutic intervention in order to improve the following deficits and impairments:     Visit Diagnosis: Pain in thoracic spine       G-Codes - 24-Dec-2016 0905    Functional Assessment Tool Used (Outpatient Only) PT judgement and FOTO   Functional Limitation Carrying, moving and handling objects   Carrying, Moving and Handling Objects Current Status (G9211) At least 1 percent but less than 20 percent impaired, limited or restricted   Carrying, Moving and Handling Objects Goal Status (H4174) At least 1 percent but less than 20 percent impaired, limited or restricted   Carrying, Moving and Handling Objects Discharge Status [phone]) At least 1 percent but less than 20 percent impaired, limited or restricted      Problem List Patient Active Problem List   Diagnosis Date Noted  . Essential hypertension 08/26/2016  . Dyspnea on exertion 08/24/2016  . Atypical chest pain 08/24/2016  . Rapid heartbeat 08/24/2016  . Insomnia 05/13/2016  . Post-operative state 05/25/2013  . Acute bronchitis 09/21/2012  . Obstructive sleep apnea 05/29/2012    Zannie Cove 12/24/16, 9:28 AM  [facility] [address], [place], [place], [address] Phone: [phone]   Fax:  [phone]  Name: Beth Sullivan MRN: [id number] Date of Birth: Aug 12, 1945  PHYSICAL THERAPY DISCHARGE SUMMARY  Visits from Start of Care: 8  Current functional level related to goals /  functional outcomes: See above, goals met   Remaining deficits: none   Education / Equipment: HEP  Plan: Patient agrees to discharge.  Patient goals were met. Patient is being discharged due to meeting the stated rehab goals.  ?????         Google, PT 12/24/2016 9:28 AM

## 2016-12-13 ENCOUNTER — Encounter: Payer: Self-pay | Admitting: Physical Therapy

## 2016-12-25 ENCOUNTER — Ambulatory Visit (INDEPENDENT_AMBULATORY_CARE_PROVIDER_SITE_OTHER): Payer: Medicare Other | Admitting: Psychology

## 2016-12-25 DIAGNOSIS — F509 Eating disorder, unspecified: Secondary | ICD-10-CM

## 2017-01-01 ENCOUNTER — Encounter: Payer: Self-pay | Admitting: Registered"

## 2017-01-01 ENCOUNTER — Encounter: Payer: Medicare Other | Attending: Surgery | Admitting: Registered"

## 2017-01-01 DIAGNOSIS — Z713 Dietary counseling and surveillance: Secondary | ICD-10-CM | POA: Insufficient documentation

## 2017-01-01 DIAGNOSIS — Z6841 Body Mass Index (BMI) 40.0 and over, adult: Secondary | ICD-10-CM | POA: Insufficient documentation

## 2017-01-01 DIAGNOSIS — E669 Obesity, unspecified: Secondary | ICD-10-CM

## 2017-01-01 NOTE — Patient Instructions (Addendum)
-   Aim to have protein shake and fruit as you're leaving water aerobics. Plan the night before.   - Aim to not drink 15 min before, during, and wait 30 min after eating to drink.   - Keep up the great work with being intentional about exercise! You're doing great!

## 2017-01-01 NOTE — Progress Notes (Signed)
Appt start time: 9:10 end time: 9:33  Assessment: 3rd SWL Appointment.   Start Wt at NDES: 261.6 Wt: 253.6 BMI: 46.38   Pt arrives having maintained weight from previous visit.  Pt sates she likes the bananas and cream and chocolate flavors of Premier Protein. Pt states she is still working on chewing and it is going well. Pt states she has an issue eating 3 meals a day, especially when she eats a late lunch after going to water aerobics in the morning and not immediately going back home afterwards. Pt states she typically wakes up at 5-6am and may have breakfast around 10am.   Pt states that although her SWL visits will end in Dec, she was told that her insurance will rollover and its ok to have surgery in Jan without having to meet deductible again.   Pt states she is still deciding between sleeve gastrectomy and RYGB. Pt is really nice lady and doing well with making lifestyle changes.    MEDICATIONS: See list   DIETARY INTAKE:  24-hr recall:  B ( AM): boiled egg, 2 strips of [place] bacon or protein shake Snk ( AM): none  L ( PM): baked chicken, stewed okra, turnip greens or Premier protein Snk ( PM): sometimes fruit D ( PM): [employer]-chicken sandwich  Snk ( PM): none Beverages: tea (75% unsweet/25% sweet), water w/ flavored packs, decaf coffee  Usual physical activity: water aerobics 2 hrs, 3x/wk  Diet to Follow: 1600 calories 180 g carbohydrates 120 g protein 44 g fat  Preferred Learning Style:   No preference indicated   Learning Readiness:   Ready  Change in progress  Nutritional Diagnosis:  [place]-3.3 Overweight/obesity related to past poor dietary habits and physical inactivity as evidenced by patient w/ planned sleeve gastrectomy/RYGB surgery following dietary guidelines for continued weight loss.    Intervention:  Nutrition counseling for upcoming Bariatric Surgery.  Goals:  - Aim to have protein shake and fruit as you're leaving water aerobics. Plan the  night before.  - Aim to not drink 15 min before, during, and wait 30 min after eating to drink.  - Keep up the great work with being intentional about exercise! You're doing great!  Teaching Method Utilized:  Visual Auditory  Handouts given during visit include:  none  Barriers to learning/adherence to lifestyle change: none  Demonstrated degree of understanding via:  Teach Back   Monitoring/Evaluation:  Dietary intake, exercise, and body weight in 1 month(s).

## 2017-01-07 ENCOUNTER — Ambulatory Visit (INDEPENDENT_AMBULATORY_CARE_PROVIDER_SITE_OTHER): Payer: Medicare Other | Admitting: Psychology

## 2017-01-07 ENCOUNTER — Ambulatory Visit: Payer: Medicare Other | Admitting: Psychology

## 2017-01-07 DIAGNOSIS — F509 Eating disorder, unspecified: Secondary | ICD-10-CM | POA: Diagnosis not present

## 2017-01-29 ENCOUNTER — Encounter: Payer: Medicare Other | Attending: Surgery | Admitting: Registered"

## 2017-01-29 ENCOUNTER — Encounter: Payer: Self-pay | Admitting: Registered"

## 2017-01-29 DIAGNOSIS — Z6841 Body Mass Index (BMI) 40.0 and over, adult: Secondary | ICD-10-CM | POA: Diagnosis not present

## 2017-01-29 DIAGNOSIS — Z713 Dietary counseling and surveillance: Secondary | ICD-10-CM | POA: Insufficient documentation

## 2017-01-29 DIAGNOSIS — E669 Obesity, unspecified: Secondary | ICD-10-CM

## 2017-01-29 NOTE — Progress Notes (Signed)
Appt start time: 9:10 end time: 9:29  Assessment: 4th SWL Appointment.   Start Wt at NDES: 261.6 Wt: 251.3 BMI: 45.96   Pt arrives having lost 2 lbs from previous visit. Pt states she is drinking a 1/2 protein shake to and from water aerobics and sometimes cheese stick to help with getting in 3 meals/day. Pt states she has been planning snacks/meals the night before which helps with getting daily routine in order. Pt states she does not typically drink with meals. Pt states she does well with waiting 15 minutes before meal and 30 minutes afterwards to not drink. Pt states she is still chewing well when eating. Pt states she went out to eat with friends and told them she is working on learning how to chew her food; handled social environment well. Pt was encouraged that weight is not the most important topic, but that we want to establish healthy habits throughout this journey.  Pt sates she likes the bananas and cream and chocolate flavors of Premier Protein.   Pt states that although her SWL visits will end in Dec, she was told that her insurance will rollover and its ok to have surgery in Jan without having to meet deductible again.   Pt states she is leaning more towards the sleeve. Pt is really nice lady and doing well with making lifestyle changes.    MEDICATIONS: See list   DIETARY INTAKE:  24-hr recall:  B ( AM): boiled egg, 2 strips of [place] bacon or protein shake Snk ( AM): none  L ( PM): baked chicken, stewed okra, turnip greens or Premier protein Snk ( PM): sometimes fruit D ( PM): [employer]-chicken sandwich  Snk ( PM): none Beverages: unsweetened tea w/ stevia, water w/ flavored packs, decaf coffee  Usual physical activity: water aerobics 2 hrs, 3x/wk  Diet to Follow: 1600 calories 180 g carbohydrates 120 g protein 44 g fat  Preferred Learning Style:   No preference indicated   Learning Readiness:   Ready  Change in progress  Nutritional Diagnosis:  [place]-3.3  Overweight/obesity related to past poor dietary habits and physical inactivity as evidenced by patient w/ planned sleeve gastrectomy/RYGB surgery following dietary guidelines for continued weight loss.    Intervention:  Nutrition counseling for upcoming Bariatric Surgery.  Goals:  - Continue to work on getting 3 meals a day.  - Aim to track to food and fluid using MyFitnessPal or Baritastic apps. - Keep up the great work with what you're doing!  Teaching Method Utilized:  Visual Auditory  Handouts given during visit include:  none  Barriers to learning/adherence to lifestyle change: none  Demonstrated degree of understanding via:  Teach Back   Monitoring/Evaluation:  Dietary intake, exercise, and body weight in 1 month(s).

## 2017-01-29 NOTE — Patient Instructions (Signed)
-   Continue to work on getting 3 meals a day.   - Aim to track to food and fluid using MyFitnessPal or Baritastic apps.  - Keep up the great work with what you're doing!

## 2017-02-20 ENCOUNTER — Encounter: Payer: Self-pay | Admitting: Registered"

## 2017-02-20 ENCOUNTER — Encounter: Payer: Medicare Other | Attending: Surgery | Admitting: Registered"

## 2017-02-20 DIAGNOSIS — Z6841 Body Mass Index (BMI) 40.0 and over, adult: Secondary | ICD-10-CM | POA: Insufficient documentation

## 2017-02-20 DIAGNOSIS — E669 Obesity, unspecified: Secondary | ICD-10-CM

## 2017-02-20 DIAGNOSIS — Z713 Dietary counseling and surveillance: Secondary | ICD-10-CM | POA: Insufficient documentation

## 2017-02-20 NOTE — Patient Instructions (Addendum)
-   Continue to aim to not drink with meals.   - Aim to track food and fluid using MyFitnessPal or Baritastic App.   - You are doing great! Keep up with the great habits!

## 2017-02-20 NOTE — Progress Notes (Signed)
Appt start time: 8:01 end time: 8:25  Assessment: 5th SWL Appointment.   Start Wt at [facility]: 261.6 Wt: 250.0 BMI: 45.73   Pt arrives having maintained weight from previous visit. Pt states she thinks chewing well is helping her with reducing portion sizes and states she feels satisfied with the amounts that she eats. Pt states she is eating 3 meals a day and some snacks. Pt states she is learning how to do more water activities. Pt reports coming to nutrition appointments help her with accountability and she enjoys it. Pt states she was in a wedding 2 weeks ago, currently helping her longtime friend recover from having a stroke. Pt states her tentative surgery date is 12/11.  Pt states she is drinking a 1/2 protein shake to and from water aerobics and sometimes cheese stick to help with getting in 3 meals/day. Pt states she has been planning snacks/meals the night before which helps with getting daily routine in order. Pt states she does not typically drink with meals. Pt states she does well with waiting 15 minutes before meal and 30 minutes afterwards to not drink. Pt states she is still chewing well when eating. Pt states she went out to eat with friends and told them she is working on learning how to chew her food; handled social environment well. Pt was encouraged that weight is not the most important topic, but that we want to establish healthy habits throughout this journey.  Pt sates she likes the bananas and cream and chocolate flavors of Premier Protein.   Pt states that although her SWL visits will end in Dec, she was told that her insurance will rollover and its ok to have surgery in Jan without having to meet deductible again.   Pt states she is leaning more towards the sleeve. Pt is really nice lady and doing well with making lifestyle changes.    MEDICATIONS: See list   DIETARY INTAKE:  24-hr recall:  B ( AM): tangerine, 2 scrambled eggs with cheese or 2 strips of [place] bacon  or protein shake Snk ( AM): none  L ( PM): teriyaki chicken, lima beans, okra, cabbage or  baked chicken, stewed okra, turnip greens or Premier protein Snk ( PM): 3 peanut butter crackers D ( PM): teriyaki chicken, lima beans, okra, cabbage  Snk ( PM): none Beverages: unsweetened tea w/ stevia, water w/ flavored packs, decaf coffee  Usual physical activity: water aerobics 2 hrs, 3x/wk  Diet to Follow: 1600 calories 180 g carbohydrates 120 g protein 44 g fat  Preferred Learning Style:   No preference indicated   Learning Readiness:   Ready  Change in progress  Nutritional Diagnosis:  [place]-3.3 Overweight/obesity related to past poor dietary habits and physical inactivity as evidenced by patient w/ planned sleeve gastrectomy/RYGB surgery following dietary guidelines for continued weight loss.    Intervention:  Nutrition counseling for upcoming Bariatric Surgery.  Goals:  - Continue to aim to not drink with meals.  - Aim to track food and fluid using MyFitnessPal or Baritastic App.  - You are doing great! Keep up with the great habits!  Teaching Method Utilized:  Visual Auditory  Handouts given during visit include:  none  Barriers to learning/adherence to lifestyle change: none  Demonstrated degree of understanding via:  Teach Back   Monitoring/Evaluation:  Dietary intake, exercise, and body weight in 1 month(s).

## 2017-02-28 ENCOUNTER — Ambulatory Visit: Payer: Self-pay | Admitting: Registered"

## 2017-03-11 ENCOUNTER — Encounter: Payer: Medicare Other | Admitting: Registered"

## 2017-03-11 DIAGNOSIS — E669 Obesity, unspecified: Secondary | ICD-10-CM

## 2017-03-11 NOTE — Progress Notes (Signed)
  Pre-Operative Nutrition Class:  Appt start time: 8:15   End time:  9:15.  Patient was seen on 03/11/2017 for Pre-Operative Bariatric Surgery Education [facility].   Surgery date: 03/26/2017 Surgery type: RYGB Start weight at [facility]: 261.6 Weight today: 254.0   Samples given per MNT protocol. Patient educated on appropriate usage: Bariatric Advantage Multivitamin Lot # [id number] Exp: 10/2017  Bariatric Advantage Calcium Citrate Lot # [id number] Exp: 12/27/2017  Bariatric Advantage Calcium Citrate Lot # [id number] Exp: 01/17/2018  Renee Pain Protein Powder Lot # [id number] Exp: 06/2017   The following the learning objectives were met by the patient during this course:  Identify Pre-Op Dietary Goals and will begin 2 weeks pre-operatively  Identify appropriate sources of fluids and proteins   State protein recommendations and appropriate sources pre and post-operatively  Identify Post-Operative Dietary Goals and will follow for 2 weeks post-operatively  Identify appropriate multivitamin and calcium sources  Describe the need for physical activity post-operatively and will follow MD recommendations  State when to call healthcare provider regarding medication questions or post-operative complications  Handouts given during class include:  Pre-Op Bariatric Surgery Diet Handout  Protein Shake Handout  Post-Op Bariatric Surgery Nutrition Handout  BELT Program Information Flyer  Support Group Information Flyer  [facility] Bariatric Supplements Price List  Follow-Up Plan: Patient will follow-up at [facility] 2 weeks post operatively for diet advancement per MD.

## 2017-03-13 ENCOUNTER — Encounter: Payer: Medicare Other | Admitting: Registered"

## 2017-03-13 ENCOUNTER — Encounter: Payer: Self-pay | Admitting: Registered"

## 2017-03-13 DIAGNOSIS — E669 Obesity, unspecified: Secondary | ICD-10-CM

## 2017-03-13 NOTE — Progress Notes (Signed)
Appt start time: 8:50 end time: 9:02  Assessment: 6th SWL Appointment.   Start Wt at N[facility]: 261.6 Wt: 251.8 BMI: 45.73   Pt arrives having gained 1.8 lbs from previous visit. Pt states she has surgery hold date 12/11. Pt states she feels nauseas after eating and drinking, thinks it is related to stress of close friend recently having stroke. Pt states she is not doing well with tracking food using app but keeps track of it in her mind. Pt states she is still working on not drinking with meals. Pt is doing well with making behavioral changes for surgery.   Pt was encouraged that weight is not the most important topic, but that we want to establish healthy habits throughout this journey.  Pt sates she likes the bananas and cream and chocolate flavors of Premier Protein.   Pt states that although her SWL visits will end in Dec, she was told that her insurance will rollover and its ok to have surgery in Jan without having to meet deductible again.   Pt is really nice lady and doing well with making lifestyle changes.   Pt was provided sample: [place] chewable MVI Lot # [id number] Exp 10/2018   MEDICATIONS: See list   DIETARY INTAKE:  24-hr recall:  B ( AM): tangerine, 2 scrambled eggs with cheese or 2 strips of [place] bacon or protein shake Snk ( AM): none  L ( PM): teriyaki chicken, lima beans, okra, cabbage or  baked chicken, stewed okra, turnip greens or Premier protein Snk ( PM): 3 peanut butter crackers D ( PM): teriyaki chicken, lima beans, okra, cabbage  Snk ( PM): none Beverages: unsweetened tea w/ stevia, water w/ flavored packs, decaf coffee  Usual physical activity: water aerobics 2 hrs, 3x/wk  Diet to Follow: 1600 calories 180 g carbohydrates 120 g protein 44 g fat  Preferred Learning Style:   No preference indicated   Learning Readiness:   Ready  Change in progress  Nutritional Diagnosis:  [place]-3.3 Overweight/obesity related to past poor dietary  habits and physical inactivity as evidenced by patient w/ planned sleeve gastrectomy/RYGB surgery following dietary guidelines for continued weight loss.    Intervention:  Nutrition counseling for upcoming Bariatric Surgery.  Goals:  - Start pre-op diet 2 weeks prior to surgery date. - Continue to aim to not drink with meals.  - Keep up the great work with what you're doing!  Teaching Method Utilized:  Visual Auditory  Handouts given during visit include:  [place] location  Barriers to learning/adherence to lifestyle change: none  Demonstrated degree of understanding via:  Teach Back   Monitoring/Evaluation:  Dietary intake, exercise, and body weight in 1 month(s).

## 2017-03-13 NOTE — Patient Instructions (Signed)
-   Start pre-op diet 2 weeks prior to surgery date.  - Continue to aim to not drink with meals.   - Keep up the great work with what you're doing!

## 2017-03-18 ENCOUNTER — Ambulatory Visit: Payer: Medicare Other | Admitting: Psychology

## 2017-03-18 DIAGNOSIS — F509 Eating disorder, unspecified: Secondary | ICD-10-CM | POA: Diagnosis not present

## 2017-04-17 ENCOUNTER — Other Ambulatory Visit: Payer: Self-pay | Admitting: Family Medicine

## 2017-04-17 DIAGNOSIS — Z1231 Encounter for screening mammogram for malignant neoplasm of breast: Secondary | ICD-10-CM

## 2017-04-22 NOTE — Pre-Procedure Instructions (Signed)
The following are in epic: Last office visit note Dr. Ellyn Hack 10/14/16 EKG, KUB, CXR 10/09/16 ECHO 09/11/16

## 2017-04-22 NOTE — Patient Instructions (Signed)
Your procedure is scheduled on: Tuesday, Jan. 15, 2019   Surgery Time:  12:45PM-3:15PM   Report to [facility] Main  Entrance    Report to admitting at 10:45 AM    Call this number if you have problems the morning of surgery [phone]   Do not eat food or drink liquids :After Midnight.   Do NOT smoke after Midnight     Take these medicines the morning of surgery with A SIP OF WATER: None                               You may not have any metal on your body including hair pins, jewelry, and body piercings             Do not wear make-up, lotions, powders, perfumes/cologne, or deodorant             Do not wear nail polish.  Do not shave  48 hours prior to surgery.              Do not bring valuables to the hospital. [place].   Contacts, dentures or bridgework may not be worn into surgery.   Leave suitcase in the car. After surgery it may be brought to your room.    Special Instructions: Bring a copy of your healthcare power of attorney and living will documents         the day of surgery if you haven't scanned them in before.              Please read over the following fact sheets you were given:   [facility] - Preparing for Surgery Before surgery, you can play an important role.  Because skin is not sterile, your skin needs to be as free of germs as possible.  You can reduce the number of germs on your skin by washing with CHG (chlorahexidine gluconate) soap before surgery.  CHG is an antiseptic cleaner which kills germs and bonds with the skin to continue killing germs even after washing. Please DO NOT use if you have an allergy to CHG or antibacterial soaps.  If your skin becomes reddened/irritated stop using the CHG and inform your nurse when you arrive at Short Stay. Do not shave (including legs and underarms) for at least 48 hours prior to the first CHG shower.  You may shave your face/neck.  Please follow  these instructions carefully:  1.  Shower with CHG Soap the night before surgery and the  morning of surgery.  2.  If you choose to wash your hair, wash your hair first as usual with your normal  shampoo.  3.  After you shampoo, rinse your hair and body thoroughly to remove the shampoo.                             4.  Use CHG as you would any other liquid soap.  You can apply chg directly to the skin and wash.  Gently with a scrungie or clean washcloth.  5.  Apply the CHG Soap to your body ONLY FROM THE NECK DOWN.   Do   not use on face/ open  Wound or open sores. Avoid contact with eyes, ears mouth and   genitals (private parts).                       Wash face,  Genitals (private parts) with your normal soap.             6.  Wash thoroughly, paying special attention to the area where your    surgery  will be performed.  7.  Thoroughly rinse your body with warm water from the neck down.  8.  DO NOT shower/wash with your normal soap after using and rinsing off the CHG Soap.                9.  Pat yourself dry with a clean towel.            10.  Wear clean pajamas.            11.  Place clean sheets on your bed the night of your first shower and do not  sleep with pets. Day of Surgery : Do not apply any lotions/deodorants the morning of surgery.  Please wear clean clothes to the hospital/surgery center.  FAILURE TO FOLLOW THESE INSTRUCTIONS MAY RESULT IN THE CANCELLATION OF YOUR SURGERY  PATIENT SIGNATURE_________________________________  NURSE SIGNATURE__________________________________  ________________________________________________________________________

## 2017-04-24 ENCOUNTER — Other Ambulatory Visit: Payer: Self-pay

## 2017-04-24 ENCOUNTER — Encounter (HOSPITAL_COMMUNITY): Payer: Self-pay

## 2017-04-24 ENCOUNTER — Encounter (HOSPITAL_COMMUNITY)
Admission: RE | Admit: 2017-04-24 | Discharge: 2017-04-24 | Disposition: A | Discharge status: Home / Self Care | Payer: Medicare Other | Source: Ambulatory Visit | Attending: Surgery | Admitting: Surgery

## 2017-04-24 DIAGNOSIS — Z01812 Encounter for preprocedural laboratory examination: Secondary | ICD-10-CM | POA: Diagnosis present

## 2017-04-24 HISTORY — DX: Prediabetes: R73.03

## 2017-04-24 HISTORY — DX: Gastro-esophageal reflux disease without esophagitis: K21.9

## 2017-04-24 LAB — BASIC METABOLIC PANEL
Anion gap: 7 (ref 5–15)
BUN: 42 mg/dL — AB (ref 6–20)
CALCIUM: 9.5 mg/dL (ref 8.9–10.3)
CHLORIDE: 101 mmol/L (ref 101–111)
CO2: 26 mmol/L (ref 22–32)
Creatinine, Ser: 1.03 mg/dL — ABNORMAL HIGH (ref 0.44–1.00)
GFR calc non Af Amer: 54 mL/min — ABNORMAL LOW (ref >60)
Glucose, Bld: 94 mg/dL (ref 65–99)
Potassium: 4.4 mmol/L (ref 3.5–5.1)
SODIUM: 134 mmol/L — AB (ref 135–145)

## 2017-04-24 LAB — CBC
HCT: 35.1 % — ABNORMAL LOW (ref 36.0–46.0)
HEMOGLOBIN: 11.5 g/dL — AB (ref 12.0–15.0)
MCH: 30.3 pg (ref 26.0–34.0)
MCHC: 32.8 g/dL (ref 30.0–36.0)
MCV: 92.4 fL (ref 78.0–100.0)
Platelets: 220 10*3/uL (ref 150–400)
RBC: 3.8 MIL/uL — ABNORMAL LOW (ref 3.87–5.11)
RDW: 14.4 % (ref 11.5–15.5)
WBC: 6.5 10*3/uL (ref 4.0–10.5)

## 2017-04-24 NOTE — Pre-Procedure Instructions (Addendum)
Hgb A1C 04/11/17 (6.1) in chart Last office visit note Dr. Leighton Ruff 04/11/17 in chart

## 2017-04-24 NOTE — Pre-Procedure Instructions (Signed)
CBC and BMP results 04/24/17 faxed to Dr. Hassell Done via epic.

## 2017-04-25 ENCOUNTER — Ambulatory Visit: Payer: Self-pay | Admitting: Surgery

## 2017-04-25 MED FILL — oxyCODONE HCL 5 MG/5ML SOLN: 5 | 2 days supply | Qty: 120 | Fill #0

## 2017-04-25 NOTE — H&P (Signed)
Chief Complaint:  Morbid obesity  History of Present Illness:  Beth Sullivan is an 72 y.o. female who has completed her 6 months of supervised weight loss and presents for sleeve gastrectomy.  She had prediabetes;  However, Medicare would not pay for her to have a gastric bypass-her first choice and would only approve for a sleeve gastrectomy.  She is OK with that.    Her questions regarding the surgery have been answered and she is ready to undergo sleeve gastrectomy.    Past Medical History:  Diagnosis Date  . Allergic rhinitis   . Chronic headaches   . Chronic pain disorder    From long-standing cervical spine disease - chronic pain management, history of  . CKD (chronic kidney disease), stage II    Per PCP notes - Cr 0.9-1.3  . GERD (gastroesophageal reflux disease)   . History of colon polyps 02/2014   Dr. Lizbeth Bark  . Hyperlipemia   . Hypertension   . Morbid obesity with BMI of 45.0-49.9, adult ([place])   . Pre-diabetes   . Sleep apnea    uses a cpap - Dr. Annamaria Boots    Past Surgical History:  Procedure Laterality Date  . ABDOMINAL HYSTERECTOMY  1975   BSO  . BREAST LUMPECTOMY WITH NEEDLE LOCALIZATION Left 05/12/2013   Procedure: BREAST LUMPECTOMY WITH NEEDLE LOCALIZATION;  Surgeon: Marcello Moores A. Cornett, MD;  Location: [place];  Service: General;  Laterality: Left;  . COLONOSCOPY    . EYE SURGERY  2008   both cataracts  . FOOT SURGERY  08-2011   Left  . HAND SURGERY Right   . TUBAL LIGATION    . VESICOVAGINAL FISTULA CLOSURE W/ TAH      Current Outpatient Medications  Medication Sig Dispense Refill  . aspirin EC 81 MG tablet Take 81 mg by mouth at bedtime.    Marland Kitchen atorvastatin (LIPITOR) 10 MG tablet Take 10 mg by mouth at bedtime.     . betamethasone dipropionate (DIPROLENE) 0.05 % cream Apply 1 application topically daily.    . Calcium Carb-Cholecalciferol (CALCIUM 500 +D PO) Take 1 tablet by mouth daily.     . chlorthalidone (HYGROTON) 25 MG tablet Take 25 mg  by mouth daily.    . Cholecalciferol (VITAMIN D PO) Take 5,000 Units by mouth daily.     . cloNIDine (CATAPRES - DOSED IN MG/24 HR) 0.1 mg/24hr patch Place 1 patch onto the skin every Sunday.     . losartan (COZAAR) 100 MG tablet Take 100 mg by mouth daily.    . Multiple Vitamin (MULTIVITAMIN WITH MINERALS) TABS tablet Take 1 tablet by mouth daily.    . Omega-3 Fatty Acids (FISH OIL) 1200 MG CAPS Take 1,200 mg by mouth 2 (two) times daily.     Marland Kitchen spironolactone (ALDACTONE) 25 MG tablet Take 25 mg by mouth daily.    . verapamil (COVERA HS) 240 MG (CO) 24 hr tablet Take 240 mg by mouth 2 (two) times daily.     . vitamin C (ASCORBIC ACID) 500 MG tablet Take 500 mg by mouth daily.     No current facility-administered medications for this visit.    Tramadol Family History  Problem Relation Age of Onset  . Heart disease Mother        Unaware of specifics  . Hypertension Mother   . Diabetes Mother   . Diabetes Father   . Hypertension Father   . Prostate cancer Brother   . Prostate cancer  Brother   . Hypertension Sister   . Heart disease Sister        Unaware of specifics  . Heart disease Brother        Our specifics   Social History:   reports that  has never smoked. she has never used smokeless tobacco. She reports that she does not drink alcohol or use drugs.   REVIEW OF SYSTEMS : Negative except for gyn surgery as noted  Physical Exam:   There were no vitals taken for this visit. There is no height or weight on file to calculate BMI.  Gen:  WDWN AAF NAD  Neurological: Alert and oriented to person, place, and time. Motor and sensory function is grossly intact  Head: Normocephalic and atraumatic.  Eyes: Conjunctivae are normal. Pupils are equal, round, and reactive to light. No scleral icterus.  Neck: Normal range of motion. Neck supple. No tracheal deviation or thyromegaly present.  Cardiovascular:  SR without murmurs or gallops.  No carotid bruits Breast:  Not  examined Respiratory: Effort normal.  No respiratory distress. No chest wall tenderness. Breath sounds normal.  No wheezes, rales or rhonchi.  Abdomen:  nontender but obese GU:  Not examined Musculoskeletal: Normal range of motion. Extremities are nontender. No cyanosis, edema or clubbing noted Lymphadenopathy: No cervical, preauricular, postauricular or axillary adenopathy is present Skin: Skin is warm and dry. No rash noted. No diaphoresis. No erythema. No pallor. Pscyh: Normal mood and affect. Behavior is normal. Judgment and thought content normal.   LABORATORY RESULTS: Results for orders placed or performed during the hospital encounter of 04/24/17 (from the past 48 hour(s))  CBC     Status: Abnormal   Collection Time: 04/24/17  9:04 AM  Result Value Ref Range   WBC 6.5 4.0 - 10.5 K/uL   RBC 3.80 (L) 3.87 - 5.11 MIL/uL   Hemoglobin 11.5 (L) 12.0 - 15.0 g/dL   HCT 35.1 (L) 36.0 - 46.0 %   MCV 92.4 78.0 - 100.0 fL   MCH 30.3 26.0 - 34.0 pg   MCHC 32.8 30.0 - 36.0 g/dL   RDW 14.4 11.5 - 15.5 %   Platelets 220 150 - 400 K/uL  Basic metabolic panel     Status: Abnormal   Collection Time: 04/24/17  9:04 AM  Result Value Ref Range   Sodium 134 (L) 135 - 145 mmol/L   Potassium 4.4 3.5 - 5.1 mmol/L   Chloride 101 101 - 111 mmol/L   CO2 26 22 - 32 mmol/L   Glucose, Bld 94 65 - 99 mg/dL   BUN 42 (H) 6 - 20 mg/dL   Creatinine, Ser 1.03 (H) 0.44 - 1.00 mg/dL   Calcium 9.5 8.9 - 10.3 mg/dL   GFR calc non Af Amer 54 (L) >60 mL/min   GFR calc Af Amer >60 >60 mL/min    Comment: (NOTE) The eGFR has been calculated using the CKD EPI equation. This calculation has not been validated in all clinical situations. eGFR's persistently <60 mL/min signify possible Chronic Kidney Disease.    Anion gap 7 5 - 15     RADIOLOGY RESULTS: No results found.  Problem List: Patient Active Problem List   Diagnosis Date Noted  . Essential hypertension 08/26/2016  . Dyspnea on exertion 08/24/2016   . Atypical chest pain 08/24/2016  . Rapid heartbeat 08/24/2016  . Insomnia 05/13/2016  . Post-operative state 05/25/2013  . Acute bronchitis 09/21/2012  . Obstructive sleep apnea 05/29/2012    Assessment &  Plan: Morbidly obese AAF who appears younger than 28 and desires sleeve gastrectomy to treat her morbid obesity (BMI 45).    Matt B. Hassell Done, MD, [facility] [phone] beeper [phone]  04/25/2017 10:21 AM

## 2017-04-25 NOTE — H&P (View-Only) (Signed)
Chief Complaint:  Morbid obesity  History of Present Illness:  Beth Sullivan is an 72 y.o. female who has completed her 6 months of supervised weight loss and presents for sleeve gastrectomy.  She had prediabetes;  However, Medicare would not pay for her to have a gastric bypass-her first choice and would only approve for a sleeve gastrectomy.  She is OK with that.    Her questions regarding the surgery have been answered and she is ready to undergo sleeve gastrectomy.    Past Medical History:  Diagnosis Date  . Allergic rhinitis   . Chronic headaches   . Chronic pain disorder    From long-standing cervical spine disease - chronic pain management, history of  . CKD (chronic kidney disease), stage II    Per PCP notes - Cr 0.9-1.3  . GERD (gastroesophageal reflux disease)   . History of colon polyps 02/2014   Dr. Lizbeth Bark  . Hyperlipemia   . Hypertension   . Morbid obesity with BMI of 45.0-49.9, adult ([name])   . Pre-diabetes   . Sleep apnea    uses a cpap - Dr. Annamaria Boots    Past Surgical History:  Procedure Laterality Date  . ABDOMINAL HYSTERECTOMY  1975   BSO  . BREAST LUMPECTOMY WITH NEEDLE LOCALIZATION Left 05/12/2013   Procedure: BREAST LUMPECTOMY WITH NEEDLE LOCALIZATION;  Surgeon: Marcello Moores A. Cornett, MD;  Location: [facility];  Service: General;  Laterality: Left;  . COLONOSCOPY    . EYE SURGERY  2008   both cataracts  . FOOT SURGERY  08-2011   Left  . HAND SURGERY Right   . TUBAL LIGATION    . VESICOVAGINAL FISTULA CLOSURE W/ TAH      Current Outpatient Medications  Medication Sig Dispense Refill  . aspirin EC 81 MG tablet Take 81 mg by mouth at bedtime.    Marland Kitchen atorvastatin (LIPITOR) 10 MG tablet Take 10 mg by mouth at bedtime.     . betamethasone dipropionate (DIPROLENE) 0.05 % cream Apply 1 application topically daily.    . Calcium Carb-Cholecalciferol (CALCIUM 500 +D PO) Take 1 tablet by mouth daily.     . chlorthalidone (HYGROTON) 25 MG tablet Take 25 mg  by mouth daily.    . Cholecalciferol (VITAMIN D PO) Take 5,000 Units by mouth daily.     . cloNIDine (CATAPRES - DOSED IN MG/24 HR) 0.1 mg/24hr patch Place 1 patch onto the skin every Sunday.     . losartan (COZAAR) 100 MG tablet Take 100 mg by mouth daily.    . Multiple Vitamin (MULTIVITAMIN WITH MINERALS) TABS tablet Take 1 tablet by mouth daily.    . Omega-3 Fatty Acids (FISH OIL) 1200 MG CAPS Take 1,200 mg by mouth 2 (two) times daily.     Marland Kitchen spironolactone (ALDACTONE) 25 MG tablet Take 25 mg by mouth daily.    . verapamil (COVERA HS) 240 MG (CO) 24 hr tablet Take 240 mg by mouth 2 (two) times daily.     . vitamin C (ASCORBIC ACID) 500 MG tablet Take 500 mg by mouth daily.     No current facility-administered medications for this visit.    Tramadol Family History  Problem Relation Age of Onset  . Heart disease Mother        Unaware of specifics  . Hypertension Mother   . Diabetes Mother   . Diabetes Father   . Hypertension Father   . Prostate cancer Brother   . Prostate cancer  Brother   . Hypertension Sister   . Heart disease Sister        Unaware of specifics  . Heart disease Brother        Our specifics   Social History:   reports that  has never smoked. she has never used smokeless tobacco. She reports that she does not drink alcohol or use drugs.   REVIEW OF SYSTEMS : Negative except for gyn surgery as noted  Physical Exam:   There were no vitals taken for this visit. There is no height or weight on file to calculate BMI.  Gen:  WDWN AAF NAD  Neurological: Alert and oriented to person, place, and time. Motor and sensory function is grossly intact  Head: Normocephalic and atraumatic.  Eyes: Conjunctivae are normal. Pupils are equal, round, and reactive to light. No scleral icterus.  Neck: Normal range of motion. Neck supple. No tracheal deviation or thyromegaly present.  Cardiovascular:  SR without murmurs or gallops.  No carotid bruits Breast:  Not  examined Respiratory: Effort normal.  No respiratory distress. No chest wall tenderness. Breath sounds normal.  No wheezes, rales or rhonchi.  Abdomen:  nontender but obese GU:  Not examined Musculoskeletal: Normal range of motion. Extremities are nontender. No cyanosis, edema or clubbing noted Lymphadenopathy: No cervical, preauricular, postauricular or axillary adenopathy is present Skin: Skin is warm and dry. No rash noted. No diaphoresis. No erythema. No pallor. Pscyh: Normal mood and affect. Behavior is normal. Judgment and thought content normal.   LABORATORY RESULTS: Results for orders placed or performed during the hospital encounter of 04/24/17 (from the past 48 hour(s))  CBC     Status: Abnormal   Collection Time: 04/24/17  9:04 AM  Result Value Ref Range   WBC 6.5 4.0 - 10.5 K/uL   RBC 3.80 (L) 3.87 - 5.11 MIL/uL   Hemoglobin 11.5 (L) 12.0 - 15.0 g/dL   HCT 35.1 (L) 36.0 - 46.0 %   MCV 92.4 78.0 - 100.0 fL   MCH 30.3 26.0 - 34.0 pg   MCHC 32.8 30.0 - 36.0 g/dL   RDW 14.4 11.5 - 15.5 %   Platelets 220 150 - 400 K/uL  Basic metabolic panel     Status: Abnormal   Collection Time: 04/24/17  9:04 AM  Result Value Ref Range   Sodium 134 (L) 135 - 145 mmol/L   Potassium 4.4 3.5 - 5.1 mmol/L   Chloride 101 101 - 111 mmol/L   CO2 26 22 - 32 mmol/L   Glucose, Bld 94 65 - 99 mg/dL   BUN 42 (H) 6 - 20 mg/dL   Creatinine, Ser 1.03 (H) 0.44 - 1.00 mg/dL   Calcium 9.5 8.9 - 10.3 mg/dL   GFR calc non Af Amer 54 (L) >60 mL/min   GFR calc Af Amer >60 >60 mL/min    Comment: (NOTE) The eGFR has been calculated using the CKD EPI equation. This calculation has not been validated in all clinical situations. eGFR's persistently <60 mL/min signify possible Chronic Kidney Disease.    Anion gap 7 5 - 15     RADIOLOGY RESULTS: No results found.  Problem List: Patient Active Problem List   Diagnosis Date Noted  . Essential hypertension 08/26/2016  . Dyspnea on exertion 08/24/2016   . Atypical chest pain 08/24/2016  . Rapid heartbeat 08/24/2016  . Insomnia 05/13/2016  . Post-operative state 05/25/2013  . Acute bronchitis 09/21/2012  . Obstructive sleep apnea 05/29/2012    Assessment &  Plan: Morbidly obese AAF who appears younger than 15 and desires sleeve gastrectomy to treat her morbid obesity (BMI 45).    Matt B. Hassell Done, MD, [facility] [phone] beeper [phone]  04/25/2017 10:21 AM

## 2017-04-30 ENCOUNTER — Other Ambulatory Visit: Payer: Self-pay

## 2017-04-30 ENCOUNTER — Encounter (HOSPITAL_COMMUNITY): Payer: Self-pay | Admitting: *Deleted

## 2017-04-30 ENCOUNTER — Encounter (HOSPITAL_COMMUNITY)
Admission: RE | Disposition: A | Discharge status: Home / Self Care | Payer: Self-pay | Source: Ambulatory Visit | Attending: Surgery

## 2017-04-30 ENCOUNTER — Inpatient Hospital Stay (HOSPITAL_COMMUNITY)
Admission: RE | Admit: 2017-04-30 | Discharge: 2017-05-02 | DRG: 621 | Disposition: A | Discharge status: Home / Self Care | Payer: Medicare Other | Source: Ambulatory Visit | Attending: Surgery | Admitting: Surgery

## 2017-04-30 ENCOUNTER — Inpatient Hospital Stay (HOSPITAL_COMMUNITY): Payer: Medicare Other | Admitting: Certified Registered Nurse Anesthetist

## 2017-04-30 DIAGNOSIS — M542 Cervicalgia: Secondary | ICD-10-CM | POA: Diagnosis present

## 2017-04-30 DIAGNOSIS — G4700 Insomnia, unspecified: Secondary | ICD-10-CM | POA: Diagnosis present

## 2017-04-30 DIAGNOSIS — G4730 Sleep apnea, unspecified: Secondary | ICD-10-CM | POA: Diagnosis present

## 2017-04-30 DIAGNOSIS — E785 Hyperlipidemia, unspecified: Secondary | ICD-10-CM | POA: Diagnosis present

## 2017-04-30 DIAGNOSIS — K219 Gastro-esophageal reflux disease without esophagitis: Secondary | ICD-10-CM | POA: Diagnosis present

## 2017-04-30 DIAGNOSIS — Z9884 Bariatric surgery status: Secondary | ICD-10-CM

## 2017-04-30 DIAGNOSIS — Z9851 Tubal ligation status: Secondary | ICD-10-CM | POA: Diagnosis not present

## 2017-04-30 DIAGNOSIS — R7303 Prediabetes: Secondary | ICD-10-CM | POA: Diagnosis present

## 2017-04-30 DIAGNOSIS — N182 Chronic kidney disease, stage 2 (mild): Secondary | ICD-10-CM | POA: Diagnosis present

## 2017-04-30 DIAGNOSIS — Z90710 Acquired absence of both cervix and uterus: Secondary | ICD-10-CM | POA: Diagnosis not present

## 2017-04-30 DIAGNOSIS — Z6841 Body Mass Index (BMI) 40.0 and over, adult: Secondary | ICD-10-CM | POA: Diagnosis not present

## 2017-04-30 DIAGNOSIS — Z833 Family history of diabetes mellitus: Secondary | ICD-10-CM | POA: Diagnosis not present

## 2017-04-30 DIAGNOSIS — I129 Hypertensive chronic kidney disease with stage 1 through stage 4 chronic kidney disease, or unspecified chronic kidney disease: Secondary | ICD-10-CM | POA: Diagnosis present

## 2017-04-30 DIAGNOSIS — Z8249 Family history of ischemic heart disease and other diseases of the circulatory system: Secondary | ICD-10-CM | POA: Diagnosis not present

## 2017-04-30 DIAGNOSIS — Z8042 Family history of malignant neoplasm of prostate: Secondary | ICD-10-CM | POA: Diagnosis not present

## 2017-04-30 DIAGNOSIS — Z7982 Long term (current) use of aspirin: Secondary | ICD-10-CM

## 2017-04-30 DIAGNOSIS — Z79899 Other long term (current) drug therapy: Secondary | ICD-10-CM

## 2017-04-30 DIAGNOSIS — G8929 Other chronic pain: Secondary | ICD-10-CM | POA: Diagnosis present

## 2017-04-30 DIAGNOSIS — Z86010 Personal history of colonic polyps: Secondary | ICD-10-CM | POA: Diagnosis not present

## 2017-04-30 HISTORY — PX: LAPAROSCOPIC GASTRIC SLEEVE RESECTION: SHX5895

## 2017-04-30 LAB — HEMOGLOBIN AND HEMATOCRIT, BLOOD
HEMATOCRIT: 35.6 % — AB (ref 36.0–46.0)
HEMOGLOBIN: 12.1 g/dL (ref 12.0–15.0)

## 2017-04-30 LAB — TYPE AND SCREEN
ABO/RH(D): B POS
ANTIBODY SCREEN: NEGATIVE

## 2017-04-30 LAB — ABO/RH: ABO/RH(D): B POS

## 2017-04-30 SURGERY — GASTRECTOMY, SLEEVE, LAPAROSCOPIC
Anesthesia: General

## 2017-04-30 MED ORDER — PROPOFOL 10 MG/ML IV BOLUS
INTRAVENOUS | Status: AC
  Filled 2017-04-30: qty 40

## 2017-04-30 MED ORDER — ONDANSETRON HCL 4 MG/2ML IJ SOLN
INTRAMUSCULAR | Status: AC
  Filled 2017-04-30: qty 2

## 2017-04-30 MED ORDER — KETAMINE HCL 10 MG/ML IJ SOLN
INTRAMUSCULAR | Status: AC
Start: 2017-04-30 — End: 2017-04-30
  Filled 2017-04-30: qty 1

## 2017-04-30 MED ORDER — LIDOCAINE 2% (20 MG/ML) 5 ML SYRINGE
INTRAMUSCULAR | Status: AC
  Filled 2017-04-30: qty 5

## 2017-04-30 MED ORDER — DEXAMETHASONE SODIUM PHOSPHATE 10 MG/ML IJ SOLN
INTRAMUSCULAR | Status: AC
  Filled 2017-04-30: qty 1

## 2017-04-30 MED ORDER — PREMIER PROTEIN SHAKE
2.0000 [oz_av] | ORAL | Status: DC
Start: 2017-05-01 — End: 2017-05-02
  Administered 2017-05-01 – 2017-05-02 (×4): 2 [oz_av] via ORAL

## 2017-04-30 MED ORDER — DEXAMETHASONE SODIUM PHOSPHATE 4 MG/ML IJ SOLN: 4.0000 mg | INTRAMUSCULAR | Status: DC

## 2017-04-30 MED ORDER — ROCURONIUM BROMIDE 50 MG/5ML IV SOSY
PREFILLED_SYRINGE | INTRAVENOUS | Status: DC | PRN
  Administered 2017-04-30: 20 mg via INTRAVENOUS
  Administered 2017-04-30: 50 mg via INTRAVENOUS

## 2017-04-30 MED ORDER — CEFOTETAN DISODIUM-DEXTROSE 2-2.08 GM-%(50ML) IV SOLR
2.0000 g | INTRAVENOUS | Status: AC
  Administered 2017-04-30: 2 g via INTRAVENOUS
  Filled 2017-04-30: qty 50

## 2017-04-30 MED ORDER — CHLORHEXIDINE GLUCONATE CLOTH 2 % EX PADS: 6.0000 | MEDICATED_PAD | Freq: Once | CUTANEOUS | Status: DC

## 2017-04-30 MED ORDER — ALBUTEROL SULFATE HFA 108 (90 BASE) MCG/ACT IN AERS
INHALATION_SPRAY | RESPIRATORY_TRACT | Status: AC
  Filled 2017-04-30: qty 6.7

## 2017-04-30 MED ORDER — HEPARIN SODIUM (PORCINE) 5000 UNIT/ML IJ SOLN
5000.0000 [IU] | Freq: Three times a day (TID) | INTRAMUSCULAR | Status: DC
  Administered 2017-04-30 – 2017-05-02 (×5): 5000 [IU] via SUBCUTANEOUS
  Filled 2017-04-30 (×5): qty 1

## 2017-04-30 MED ORDER — BUPIVACAINE LIPOSOME 1.3 % IJ SUSP
20.0000 mL | Freq: Once | INTRAMUSCULAR | Status: AC
  Administered 2017-04-30: 20 mL
  Filled 2017-04-30: qty 20

## 2017-04-30 MED ORDER — FENTANYL CITRATE (PF) 100 MCG/2ML IJ SOLN
INTRAMUSCULAR | Status: DC | PRN
  Administered 2017-04-30 (×6): 50 ug via INTRAVENOUS

## 2017-04-30 MED ORDER — FENTANYL CITRATE (PF) 100 MCG/2ML IJ SOLN
INTRAMUSCULAR | Status: AC
  Filled 2017-04-30: qty 2

## 2017-04-30 MED ORDER — HEPARIN SODIUM (PORCINE) 5000 UNIT/ML IJ SOLN
5000.0000 [IU] | INTRAMUSCULAR | Status: AC
  Administered 2017-04-30: 5000 [IU] via SUBCUTANEOUS
  Filled 2017-04-30: qty 1

## 2017-04-30 MED ORDER — KETAMINE HCL 10 MG/ML IJ SOLN
INTRAMUSCULAR | Status: DC | PRN
  Administered 2017-04-30: 35 mg via INTRAVENOUS

## 2017-04-30 MED ORDER — PROPOFOL 10 MG/ML IV BOLUS
INTRAVENOUS | Status: AC
  Filled 2017-04-30: qty 20

## 2017-04-30 MED ORDER — ACETAMINOPHEN 500 MG PO TABS
1000.0000 mg | ORAL_TABLET | ORAL | Status: AC
  Administered 2017-04-30: 1000 mg via ORAL
  Filled 2017-04-30: qty 2

## 2017-04-30 MED ORDER — FENTANYL CITRATE (PF) 100 MCG/2ML IJ SOLN
25.0000 ug | INTRAMUSCULAR | Status: DC | PRN
  Administered 2017-04-30 (×2): 50 ug via INTRAVENOUS

## 2017-04-30 MED ORDER — PROPOFOL 10 MG/ML IV BOLUS
INTRAVENOUS | Status: DC | PRN
  Administered 2017-04-30: 150 mg via INTRAVENOUS

## 2017-04-30 MED ORDER — SCOPOLAMINE 1 MG/3DAYS TD PT72
1.0000 | MEDICATED_PATCH | TRANSDERMAL | Status: DC
  Administered 2017-04-30: 1.5 mg via TRANSDERMAL
  Filled 2017-04-30: qty 1

## 2017-04-30 MED ORDER — MIDAZOLAM HCL 2 MG/2ML IJ SOLN
INTRAMUSCULAR | Status: AC
  Filled 2017-04-30: qty 2

## 2017-04-30 MED ORDER — FENTANYL CITRATE (PF) 250 MCG/5ML IJ SOLN
INTRAMUSCULAR | Status: AC
  Filled 2017-04-30: qty 5

## 2017-04-30 MED ORDER — STERILE WATER FOR IRRIGATION IR SOLN
Status: DC | PRN
  Administered 2017-04-30: 1000 mL

## 2017-04-30 MED ORDER — APREPITANT 40 MG PO CAPS
40.0000 mg | ORAL_CAPSULE | ORAL | Status: AC
  Administered 2017-04-30: 40 mg via ORAL
  Filled 2017-04-30: qty 1

## 2017-04-30 MED ORDER — SUGAMMADEX SODIUM 200 MG/2ML IV SOLN
INTRAVENOUS | Status: DC | PRN
  Administered 2017-04-30: 400 mg via INTRAVENOUS

## 2017-04-30 MED ORDER — PROPOFOL 500 MG/50ML IV EMUL
INTRAVENOUS | Status: DC | PRN
  Administered 2017-04-30: 25 ug/kg/min via INTRAVENOUS

## 2017-04-30 MED ORDER — KCL IN DEXTROSE-NACL 20-5-0.45 MEQ/L-%-% IV SOLN
INTRAVENOUS | Status: DC
  Administered 2017-04-30 – 2017-05-01 (×4): via INTRAVENOUS
  Filled 2017-04-30 (×5): qty 1000

## 2017-04-30 MED ORDER — OXYCODONE HCL 5 MG/5ML PO SOLN: 5.0000 mg | ORAL | Status: DC | PRN

## 2017-04-30 MED ORDER — GABAPENTIN 300 MG PO CAPS
300.0000 mg | ORAL_CAPSULE | ORAL | Status: AC
  Administered 2017-04-30: 300 mg via ORAL
  Filled 2017-04-30: qty 1

## 2017-04-30 MED ORDER — ACETAMINOPHEN 160 MG/5ML PO SOLN
650.0000 mg | ORAL | Status: DC | PRN
  Administered 2017-05-01: 650 mg via ORAL
  Filled 2017-04-30: qty 20.3

## 2017-04-30 MED ORDER — ONDANSETRON HCL 4 MG/2ML IJ SOLN: 4.0000 mg | Freq: Once | INTRAMUSCULAR | Status: DC | PRN

## 2017-04-30 MED ORDER — HYDRALAZINE HCL 20 MG/ML IJ SOLN: 10.0000 mg | INTRAMUSCULAR | Status: DC | PRN

## 2017-04-30 MED ORDER — CELECOXIB 200 MG PO CAPS
400.0000 mg | ORAL_CAPSULE | ORAL | Status: AC
  Administered 2017-04-30: 400 mg via ORAL
  Filled 2017-04-30: qty 2

## 2017-04-30 MED ORDER — MORPHINE SULFATE (PF) 2 MG/ML IV SOLN
1.0000 mg | INTRAVENOUS | Status: DC | PRN
  Administered 2017-04-30 (×2): 2 mg via INTRAVENOUS
  Filled 2017-04-30 (×2): qty 1

## 2017-04-30 MED ORDER — SODIUM CHLORIDE 0.9 % IJ SOLN
INTRAMUSCULAR | Status: DC | PRN
  Administered 2017-04-30: 10 mL

## 2017-04-30 MED ORDER — ONDANSETRON HCL 4 MG/2ML IJ SOLN
4.0000 mg | INTRAMUSCULAR | Status: DC | PRN
  Administered 2017-05-01: 4 mg via INTRAVENOUS
  Filled 2017-04-30: qty 2

## 2017-04-30 MED ORDER — PANTOPRAZOLE SODIUM 40 MG IV SOLR
40.0000 mg | Freq: Every day | INTRAVENOUS | Status: DC
  Administered 2017-04-30 – 2017-05-01 (×2): 40 mg via INTRAVENOUS
  Filled 2017-04-30 (×2): qty 40

## 2017-04-30 MED ORDER — LIDOCAINE 2% (20 MG/ML) 5 ML SYRINGE
INTRAMUSCULAR | Status: DC | PRN
  Administered 2017-04-30: 1.5 mg/kg/h via INTRAVENOUS

## 2017-04-30 MED ORDER — DEXAMETHASONE SODIUM PHOSPHATE 10 MG/ML IJ SOLN
INTRAMUSCULAR | Status: DC | PRN
  Administered 2017-04-30: 5 mg via INTRAVENOUS

## 2017-04-30 MED ORDER — 0.9 % SODIUM CHLORIDE (POUR BTL) OPTIME
TOPICAL | Status: DC | PRN
  Administered 2017-04-30: 1000 mL

## 2017-04-30 MED ORDER — ONDANSETRON HCL 4 MG/2ML IJ SOLN
INTRAMUSCULAR | Status: DC | PRN
  Administered 2017-04-30: 4 mg via INTRAVENOUS

## 2017-04-30 MED ORDER — LACTATED RINGERS IR SOLN
Status: DC | PRN
  Administered 2017-04-30: 1000 mL

## 2017-04-30 MED ORDER — GABAPENTIN 250 MG/5ML PO SOLN
200.0000 mg | Freq: Two times a day (BID) | ORAL | Status: DC
  Administered 2017-05-01 (×2): 200 mg via ORAL
  Filled 2017-04-30 (×4): qty 4

## 2017-04-30 MED ORDER — LACTATED RINGERS IV SOLN
INTRAVENOUS | Status: DC
  Administered 2017-04-30: 12:00:00 via INTRAVENOUS

## 2017-04-30 SURGICAL SUPPLY — 65 items
ADH SKN CLS APL DERMABOND .7 (GAUZE/BANDAGES/DRESSINGS) ×1
APPLICATOR COTTON TIP 6IN STRL (MISCELLANEOUS) IMPLANT
APPLIER CLIP 5 13 M/L LIGAMAX5 (MISCELLANEOUS)
APPLIER CLIP ROT 10 11.4 M/L (STAPLE)
APPLIER CLIP ROT 13.4 12 LRG (CLIP)
APR CLP LRG 13.4X12 ROT 20 MLT (CLIP)
APR CLP MED LRG 11.4X10 (STAPLE)
APR CLP MED LRG 5 ANG JAW (MISCELLANEOUS)
BLADE SURG 15 STRL LF DISP TIS (BLADE) ×1 IMPLANT
BLADE SURG 15 STRL SS (BLADE) ×3
CABLE HIGH FREQUENCY MONO STRZ (ELECTRODE) ×3 IMPLANT
CLIP APPLIE 5 13 M/L LIGAMAX5 (MISCELLANEOUS) IMPLANT
CLIP APPLIE ROT 10 11.4 M/L (STAPLE) IMPLANT
CLIP APPLIE ROT 13.4 12 LRG (CLIP) IMPLANT
DERMABOND ADVANCED (GAUZE/BANDAGES/DRESSINGS) ×2
DERMABOND ADVANCED .7 DNX12 (GAUZE/BANDAGES/DRESSINGS) IMPLANT
DEVICE PMI PUNCTURE CLOSURE (MISCELLANEOUS) ×3 IMPLANT
DEVICE SUT QUICK LOAD TK 5 (STAPLE) IMPLANT
DEVICE SUT TI-KNOT TK 5X26 (MISCELLANEOUS) IMPLANT
DEVICE SUTURE ENDOST 10MM (ENDOMECHANICALS) IMPLANT
DEVICE TI KNOT TK5 (MISCELLANEOUS)
DEVICE TROCAR PUNCTURE CLOSURE (ENDOMECHANICALS) ×2 IMPLANT
DISSECTOR BLUNT TIP ENDO 5MM (MISCELLANEOUS) IMPLANT
ELECT REM PT RETURN 15FT ADLT (MISCELLANEOUS) ×3 IMPLANT
GAUZE SPONGE 4X4 12PLY STRL (GAUZE/BANDAGES/DRESSINGS) IMPLANT
GLOVE BIOGEL M 8.0 STRL (GLOVE) ×3 IMPLANT
GOWN STRL REUS W/TWL XL LVL3 (GOWN DISPOSABLE) ×12 IMPLANT
HANDLE STAPLE EGIA 4 XL (STAPLE) ×3 IMPLANT
HOVERMATT SINGLE USE (MISCELLANEOUS) ×3 IMPLANT
KIT BASIN OR (CUSTOM PROCEDURE TRAY) ×3 IMPLANT
MARKER SKIN DUAL TIP RULER LAB (MISCELLANEOUS) ×3 IMPLANT
NDL SPNL 22GX3.5 QUINCKE BK (NEEDLE) ×1 IMPLANT
NEEDLE SPNL 22GX3.5 QUINCKE BK (NEEDLE) ×3 IMPLANT
PACK UNIVERSAL I (CUSTOM PROCEDURE TRAY) ×3 IMPLANT
QUICK LOAD TK 5 (STAPLE)
RELOAD STAPLE 45 PURP MED/THCK (STAPLE) IMPLANT
RELOAD TRI 45 ART MED THCK BLK (STAPLE) ×3 IMPLANT
RELOAD TRI 45 ART MED THCK PUR (STAPLE) IMPLANT
RELOAD TRI 60 ART MED THCK BLK (STAPLE) ×3 IMPLANT
RELOAD TRI 60 ART MED THCK PUR (STAPLE) ×6 IMPLANT
SCISSORS METZENBAUM CVD 44CM (INSTRUMENTS) IMPLANT
SET IRRIG TUBING LAPAROSCOPIC (IRRIGATION / IRRIGATOR) ×3 IMPLANT
SHEARS HARMONIC ACE PLUS 45CM (MISCELLANEOUS) ×3 IMPLANT
SLEEVE ADV FIXATION 5X100MM (TROCAR) ×6 IMPLANT
SLEEVE GASTRECTOMY 36FR VISIGI (MISCELLANEOUS) ×3 IMPLANT
SOLUTION ANTI FOG 6CC (MISCELLANEOUS) ×3 IMPLANT
SPONGE LAP 18X18 X RAY DECT (DISPOSABLE) ×3 IMPLANT
STAPLER VISISTAT 35W (STAPLE) ×3 IMPLANT
SUT MNCRL AB 4-0 PS2 18 (SUTURE) ×3 IMPLANT
SUT SURGIDAC NAB ES-9 0 48 120 (SUTURE) IMPLANT
SUT VIC AB 4-0 SH 18 (SUTURE) ×3 IMPLANT
SUT VICRYL 0 TIES 12 18 (SUTURE) ×3 IMPLANT
SYR 10ML ECCENTRIC (SYRINGE) ×5 IMPLANT
SYR 20CC LL (SYRINGE) ×3 IMPLANT
SYR 50ML LL SCALE MARK (SYRINGE) ×3 IMPLANT
TOWEL OR 17X26 10 PK STRL BLUE (TOWEL DISPOSABLE) ×6 IMPLANT
TOWEL OR NON WOVEN STRL DISP B (DISPOSABLE) ×3 IMPLANT
TROCAR ADV FIXATION 5X100MM (TROCAR) ×3 IMPLANT
TROCAR BLADELESS 15MM (ENDOMECHANICALS) ×3 IMPLANT
TROCAR BLADELESS OPT 5 100 (ENDOMECHANICALS) ×3 IMPLANT
TUBE CALIBRATION LAPBAND (TUBING) IMPLANT
TUBING CONNECTING 10 (TUBING) ×3 IMPLANT
TUBING CONNECTING 10' (TUBING) ×2
TUBING ENDO SMARTCAP (MISCELLANEOUS) ×3 IMPLANT
TUBING INSUF HEATED (TUBING) ×3 IMPLANT

## 2017-04-30 NOTE — Anesthesia Preprocedure Evaluation (Addendum)
Anesthesia Evaluation  Patient identified by MRN, date of birth, ID band Patient awake    Reviewed: Allergy & Precautions, NPO status , Patient's Chart, lab work & pertinent test results  Airway Mallampati: II  TM Distance: >3 FB Neck ROM: Full    Dental  (+) Teeth Intact, Dental Advisory Given   Pulmonary sleep apnea and Continuous Positive Airway Pressure Ventilation ,    Pulmonary exam normal breath sounds clear to auscultation       Cardiovascular hypertension, Pt. on medications Normal cardiovascular exam Rhythm:Regular Rate:Normal     Neuro/Psych  Headaches, negative psych ROS   GI/Hepatic Neg liver ROS, GERD  ,  Endo/Other  Morbid obesity  Renal/GU Renal InsufficiencyRenal disease     Musculoskeletal negative musculoskeletal ROS (+) Chronic pain disorder   Abdominal   Peds  Hematology  (+) Blood dyscrasia, anemia ,   Anesthesia Other Findings Day of surgery medications reviewed with the patient.  Reproductive/Obstetrics                            Anesthesia Physical Anesthesia Plan  ASA: III  Anesthesia Plan: General   Post-op Pain Management:    Induction: Intravenous  PONV Risk Score and Plan: 4 or greater and Dexamethasone, Ondansetron, Treatment may vary due to age or medical condition and Propofol infusion  Airway Management Planned: Oral ETT  Additional Equipment:   Intra-op Plan:   Post-operative Plan: Extubation in OR  Informed Consent: I have reviewed the patients History and Physical, chart, labs and discussed the procedure including the risks, benefits and alternatives for the proposed anesthesia with the patient or authorized representative who has indicated his/her understanding and acceptance.   Dental advisory given  Plan Discussed with: CRNA  Anesthesia Plan Comments: (Risks/benefits of general anesthesia discussed with patient including risk of damage  to teeth, lips, gum, and tongue, nausea/vomiting, allergic reactions to medications, and the possibility of heart attack, stroke and death.  All patient questions answered.  Patient wishes to proceed.)       Anesthesia Quick Evaluation

## 2017-04-30 NOTE — Progress Notes (Signed)
Patient arrived to unit.  Briefly saw patient, who was having pain.  Nurse at bedside to provide pain medication.  Patient awake but sleepy.  Discussed questions with bedside RN.  No needs identified at this time.

## 2017-04-30 NOTE — Op Note (Signed)
Procedure: Upper GI endoscopy  Description of procedure: Upper GI endoscopy is performed at the completion of laparoscopic sleeve gastrectomy by Dr. Hassell Done  The video endoscope was introduced into the upper esophagus and then passed to the EG junction at about 38 cm. The esophagus appeared normal. The gastric sleeve was entered. The sleeve was tensely distended with air while the outlet was obstructed under saline irrigation by the operating surgeon. There was no evidence of leak. The staple line was intact and without bleeding. The scope was advanced to the antrum and pylorus visualized. There was no stricture or twisting or mucosal abnormality, and particularly no narrowing noted at the incisura.  The pouch was then desufflated and the scope withdrawn.  Edward Jolly MD, FACS  04/30/2017, 2:12 PM

## 2017-04-30 NOTE — Transfer of Care (Signed)
Immediate Anesthesia Transfer of Care Note  Patient: Beth Sullivan  Procedure(s) Performed: LAPAROSCOPIC GASTRIC SLEEVE RESECTION WITH UPPER ENDO (N/A )  Patient Location: PACU  Anesthesia Type:General  Level of Consciousness: oriented, drowsy and patient cooperative  Airway & Oxygen Therapy: Patient Spontanous Breathing and Patient connected to face mask oxygen  Post-op Assessment: Report given to RN and Post -op Vital signs reviewed and stable  Post vital signs: Reviewed and stable  Last Vitals:  Vitals:   04/30/17 1055 04/30/17 1447  BP: 137/70 (!) 177/78  Pulse: 72 72  Resp: 18 10  Temp: 36.7 C   SpO2: 98% 100%    Last Pain:  Vitals:   04/30/17 1055  TempSrc: Oral         Complications: No apparent anesthesia complications

## 2017-04-30 NOTE — Interval H&P Note (Signed)
History and Physical Interval Note:  04/30/2017 11:34 AM  [name]  has presented today for surgery, with the diagnosis of MORBID OBESITY  The various methods of treatment have been discussed with the patient and family. After consideration of risks, benefits and other options for treatment, the patient has consented to  Procedure(s): LAPAROSCOPIC GASTRIC SLEEVE RESECTION WITH UPPER ENDO (N/A) as a surgical intervention .  The patient's history has been reviewed, patient examined, no change in status, stable for surgery.  I have reviewed the patient's chart and labs.  Questions were answered to the patient's satisfaction.     Beth Sullivan

## 2017-04-30 NOTE — Discharge Instructions (Signed)
° ° ° °GASTRIC BYPASS/SLEEVE ° Home Care Instructions ° ° These instructions are to help you care for yourself when you go home. ° °Call: If you have any problems. °• Call [phone] and ask for the surgeon on call °• If you need immediate help, come to the ER at [facility].  °• Tell the ER staff that you are a new post-op gastric bypass or gastric sleeve patient °  °Signs and symptoms to report: • Severe vomiting or nausea °o If you cannot keep down clear liquids for longer than 1 day, call your surgeon  °• Abdominal pain that does not get better after taking your pain medication °• Fever over 100.4° F with chills °• Heart beating over 100 beats a minute °• Shortness of breath at rest °• Chest pain °•  Redness, swelling, drainage, or foul odor at incision (surgical) sites °•  If your incisions open or pull apart °• Swelling or pain in calf (lower leg) °• Diarrhea (Loose bowel movements that happen often), frequent watery, uncontrolled bowel movements °• Constipation, (no bowel movements for 3 days) if this happens: Pick one °o Milk of Magnesia, 2 tablespoons by mouth, 3 times a day for 2 days if needed °o Stop taking Milk of Magnesia once you have a bowel movement °o Call your doctor if constipation continues °Or °o Miralax  (instead of Milk of Magnesia) following the label instructions °o Stop taking Miralax once you have a bowel movement °o Call your doctor if constipation continues °• Anything you think is not normal °  °Normal side effects after surgery: • Unable to sleep at night or unable to focus °• Irritability or moody °• Being tearful (crying) or depressed °These are common complaints, possibly related to your anesthesia medications that put you to sleep, stress of surgery, and change in lifestyle.  This usually goes away a few weeks after surgery.  If these feelings continue, call your primary care doctor. °  °Wound Care: You may have surgical glue, steri-strips, or staples over your incisions after  surgery °• Surgical glue:  Looks like a clear film over your incisions and will wear off a little at a time °• Steri-strips: Strips of tape over your incisions. You may notice a yellowish color on the skin under the steri-strips. This is used to make the   steri-strips stick better. Do not pull the steri-strips off - let them fall off °• Staples: Staples may be removed before you leave the hospital °o If you go home with staples, call [facility], [phone] at for an appointment with your surgeon’s nurse to have staples removed 10 days after surgery. °• Showering: You may shower two (2) days after your surgery unless your surgeon tells you differently °o Wash gently around incisions with warm soapy water, rinse well, and gently pat dry  °o No tub baths until staples are removed, steri-strips fall off or glue is gone.  °  °Medications: • Medications should be liquid or crushed if larger than the size of a dime °• Extended release pills (medication that release a little bit at a time through the day) should NOT be crushed or cut. (examples include XL, ER, DR, SR) °• Depending on the size and number of medications you take, you may need to space (take a few throughout the day)/change the time you take your medications so that you do not over-fill your pouch (smaller stomach) °• Make sure you follow-up with your primary care doctor to   make medication changes needed during rapid weight loss and life-style changes °• If you have diabetes, follow up with the doctor that orders your diabetes medication(s) within one week after surgery and check your blood sugar regularly. °• Do not drive while taking prescription pain medication  °• It is ok to take Tylenol by the bottle instructions with your pain medicine or instead of your pain medicine as needed.  DO NOT TAKE NSAIDS (EXAMPLES OF NSAIDS:  IBUPROFREN/ NAPROXEN)  °Diet:                    First 2 Weeks ° You will see the dietician t about two (2) weeks  after your surgery. The dietician will increase the types of foods you can eat if you are handling liquids well: °• If you have severe vomiting or nausea and cannot keep down clear liquids lasting longer than 1 day, call your surgeon @ ([phone]) °Protein Shake °• Drink at least 2 ounces of shake 5-6 times per day °• Each serving of protein shakes (usually 8 - 12 ounces) should have: °o 15 grams of protein  °o And no more than 5 grams of carbohydrate  °• Goal for protein each day: °o Men = 80 grams per day °o Women = 60 grams per day °• Protein powder may be added to fluids such as non-fat milk or Lactaid milk or unsweetened Soy/Almond milk (limit to 35 grams added protein powder per serving) ° °Hydration °• Slowly increase the amount of water and other clear liquids as tolerated (See Acceptable Fluids) °• Slowly increase the amount of protein shake as tolerated  °•  Sip fluids slowly and throughout the day.  Do not use straws. °• May use sugar substitutes in small amounts (no more than 6 - 8 packets per day; i.e. Splenda) ° °Fluid Goal °• The first goal is to drink at least 8 ounces of protein shake/drink per day (or as directed by the nutritionist); some examples of protein shakes are Syntrax Nectar, Adkins Advantage, EAS Edge HP, and Unjury. See handout from pre-op Bariatric Education Class: °o Slowly increase the amount of protein shake you drink as tolerated °o You may find it easier to slowly sip shakes throughout the day °o It is important to get your proteins in first °• Your fluid goal is to drink 64 - 100 ounces of fluid daily °o It may take a few weeks to build up to this °• 32 oz (or more) should be clear liquids  °And  °• 32 oz (or more) should be full liquids (see below for examples) °• Liquids should not contain sugar, caffeine, or carbonation ° °Clear Liquids: °• Water or Sugar-free flavored water (i.e. Fruit H2O, Propel) °• Decaffeinated coffee or tea (sugar-free) °• Crystal Lite, Wyler’s Lite,  Minute Maid Lite °• Sugar-free Jell-O °• Bouillon or broth °• Sugar-free Popsicle:   *Less than 20 calories each; Limit 1 per day ° °Full Liquids: °Protein Shakes/Drinks + 2 choices per day of other full liquids °• Full liquids must be: °o No More Than 15 grams of Carbs per serving  °o No More Than 3 grams of Fat per serving °• Strained low-fat cream soup (except Cream of Potato or Tomato) °• Non-Fat milk °• Fat-free Lactaid Milk °• Unsweetened Soy Or Unsweetened Almond Milk °• Low Sugar yogurt (Dannon Lite & Fit, Greek yogurt; Oikos Triple Zero; Chobani Simply 100; Yoplait 100 calorie Greek - No Fruit on the Bottom) ° °  °Vitamins   and Minerals • Start 1 day after surgery unless otherwise directed by your surgeon °• 2 Chewable Bariatric Specific Multivitamin / Multimineral Supplement with iron (Example: Bariatric Advantage Multi EA) °• Chewable Calcium with Vitamin D-3 °(Example: 3 Chewable Calcium Plus 600 with Vitamin D-3) °o Take 500 mg three (3) times a day for a total of 1500 mg each day °o Do not take all 3 doses of calcium at one time as it may cause constipation, and you can only absorb 500 mg  at a time  °o Do not mix multivitamins containing iron with calcium supplements; take 2 hours apart °• Menstruating women and those with a history of anemia (a blood disease that causes weakness) may need extra iron °o Talk with your doctor to see if you need more iron °• Do not stop taking or change any vitamins or minerals until you talk to your dietitian or surgeon °• Your Dietitian and/or surgeon must approve all vitamin and mineral supplements °  °Activity and Exercise: Limit your physical activity as instructed by your doctor.  It is important to continue walking at home.  During this time, use these guidelines: °• Do not lift anything greater than ten (10) pounds for at least two (2) weeks °• Do not go back to work or drive until your surgeon says you can °• You may have sex when you feel comfortable  °o It is  VERY important for female patients to use a reliable birth control method; fertility often increases after surgery  °o All hormonal birth control will be ineffective for 30 days after surgery due to medications given during surgery a barrier method must be used. °o Do not get pregnant for at least 18 months °• Start exercising as soon as your doctor tells you that you can °o Make sure your doctor approves any physical activity °• Start with a simple walking program °• Walk 5-15 minutes each day, 7 days per week.  °• Slowly increase until you are walking 30-45 minutes per day °Consider joining our BELT program. [phone] or email [email or web address] °  °Special Instructions Things to remember: °• Use your CPAP when sleeping if this applies to you ° °• [facility] has two free Bariatric Surgery Support Groups that meet monthly °o The 3rd Thursday of each month, 6 pm, [facility]  °o The 2nd Friday of each month, 11:45 am in the private dining room in the basement of [facility] °• It is very important to keep all follow up appointments with your surgeon, dietitian, primary care physician, and behavioral health practitioner °• Routine follow up schedule with your surgeon include appointments at 2-3 weeks, 6-8 weeks, 6 months, and 1 year at a minimum.  Your surgeon may request to see you more often.   °o After the first year, please follow up with your bariatric surgeon and dietitian at least once a year in order to maintain best weight loss results °[facility]: [phone] °[facility]: [phone] °Bariatric Nurse Coordinator: [phone] °  °   Reviewed and Endorsed  °by [facility] Patient Education Committee, June, 2016 °Edits Approved: Aug, 2018 ° ° ° °

## 2017-04-30 NOTE — Anesthesia Procedure Notes (Signed)
Procedure Name: Intubation Date/Time: 04/30/2017 1:01 PM Performed by: West Pugh, CRNA Pre-anesthesia Checklist: Patient identified, Emergency Drugs available, Suction available, Patient being monitored and Timeout performed Patient Re-evaluated:Patient Re-evaluated prior to induction Oxygen Delivery Method: Circle system utilized Preoxygenation: Pre-oxygenation with 100% oxygen Induction Type: IV induction Ventilation: Mask ventilation without difficulty Laryngoscope Size: Mac and 3 Grade View: Grade I Tube type: Oral Tube size: 7.5 mm Number of attempts: 1 Airway Equipment and Method: Stylet Placement Confirmation: ETT inserted through vocal cords under direct vision,  positive ETCO2,  CO2 detector and breath sounds checked- equal and bilateral Secured at: 22 cm Tube secured with: Tape Dental Injury: Teeth and Oropharynx as per pre-operative assessment

## 2017-04-30 NOTE — Op Note (Signed)
Surgeon: Kaylyn Lim, MD, FACS  Asst:  Adonis Housekeeper, MD, FACS  Anes:  General endotracheal  Procedure: Laparoscopic sleeve gastrectomy and upper endoscopy  Diagnosis: Morbid obesity  Complications: none  EBL:   10 cc  Description of Procedure:  The patient was take to OR 2 and given general anesthesia.  The abdomen was prepped with Technicare and draped sterilely.  A timeout was performed.  Access to the abdomen was achieved with a 5 mm Optiview through the left upper quadrant.  Following insufflation, the state of the abdomen was found to be free of adhesions.  The ViSiGi 36Fr tube was inserted to deflate the stomach and was pulled back into the esophagus.  No hiatal hernia was noted and normal UGI noted.  The pylorus was identified and we measured 5 cm back and marked the antrum.  At that point we began dissection to take down the greater curvature of the stomach using the Harmonic scalpel.  This dissection was taken all the way up to the left crus.  Posterior attachments of the stomach were also taken down.    The ViSiGi tube was then passed into the antrum and suction applied so that it was snug along the lessor curvature.  The "crow's foot" or incisura was identified.  The sleeve gastrectomy was begun using the [employer] stapler beginning with a 4.5 cm black load with TRS.  This was followed with a 6 cm black load and then multiple firings of the purple load all with TRS.Marland Kitchen  When the sleeve was complete the tube was taken off suction and insufflated briefly.  The tube was withdrawn.  Upper endoscopy was then performed by Dr. Excell Seltzer and no bleeding or bubbles were seen.     The specimen was extracted through the 15 trocar site.  Wounds were infiltrated with Exparel and closed with 4-0 Monocryl and Dermabond.    Beth Sullivan Done, [place], [facility], [place]

## 2017-05-01 ENCOUNTER — Encounter (HOSPITAL_COMMUNITY): Payer: Self-pay | Admitting: Surgery

## 2017-05-01 LAB — CBC WITH DIFFERENTIAL/PLATELET
BASOS ABS: 0 10*3/uL (ref 0.0–0.1)
BASOS PCT: 0 %
EOS ABS: 0 10*3/uL (ref 0.0–0.7)
EOS PCT: 0 %
HCT: 32.6 % — ABNORMAL LOW (ref 36.0–46.0)
Hemoglobin: 11.2 g/dL — ABNORMAL LOW (ref 12.0–15.0)
Lymphocytes Relative: 15 %
Lymphs Abs: 1.5 10*3/uL (ref 0.7–4.0)
MCH: 31.1 pg (ref 26.0–34.0)
MCHC: 34.4 g/dL (ref 30.0–36.0)
MCV: 90.6 fL (ref 78.0–100.0)
MONO ABS: 0.2 10*3/uL (ref 0.1–1.0)
Monocytes Relative: 2 %
Neutro Abs: 8.8 10*3/uL — ABNORMAL HIGH (ref 1.7–7.7)
Neutrophils Relative %: 83 %
PLATELETS: 208 10*3/uL (ref 150–400)
RBC: 3.6 MIL/uL — AB (ref 3.87–5.11)
RDW: 14.5 % (ref 11.5–15.5)
WBC: 10.5 10*3/uL (ref 4.0–10.5)

## 2017-05-01 NOTE — Progress Notes (Signed)
Patient ID: Beth Sullivan, female   DOB: 1946-02-17, 72 y.o.   MRN: [id number] [facility] Progress Note:   1 Day Post-Op  Subjective: Mental status is clear.  Has begun drinking Objective: Vital signs in last 24 hours: Temp:  [97.5 F (36.4 C)-99 F (37.2 C)] 98.8 F (37.1 C) (01/16 0900) Pulse Rate:  [65-97] 87 (01/16 0900) Resp:  [10-20] 16 (01/16 0900) BP: (123-177)/(62-87) 145/72 (01/16 0900) SpO2:  [94 %-100 %] 94 % (01/16 0900) Weight:  [109.7 kg (241 lb 12.8 oz)] 109.7 kg (241 lb 12.8 oz) (01/15 1059)  Intake/Output from previous day: 01/15 0701 - 01/16 0700 In: 2150 [P.O.:120; I.V.:2030] Out: 1050 [Urine:1000; Blood:50] Intake/Output this shift: Total I/O In: 60 [P.O.:60] Out: 800 [Urine:800]  Physical Exam: Work of breathing is normal.  Minimal abdominal soreness  Lab Results:  Results for orders placed or performed during the hospital encounter of 04/30/17 (from the past 48 hour(s))  Type and screen     Status: None   Collection Time: 04/30/17 11:30 AM  Result Value Ref Range   ABO/RH(D) B POS    Antibody Screen NEG    Sample Expiration 05/03/2017   ABO/Rh     Status: None   Collection Time: 04/30/17 11:30 AM  Result Value Ref Range   ABO/RH(D) B POS   Hemoglobin and hematocrit, blood     Status: Abnormal   Collection Time: 04/30/17  6:07 PM  Result Value Ref Range   Hemoglobin 12.1 12.0 - 15.0 g/dL   HCT 35.6 (L) 36.0 - 46.0 %  CBC WITH DIFFERENTIAL     Status: Abnormal   Collection Time: 05/01/17  4:55 AM  Result Value Ref Range   WBC 10.5 4.0 - 10.5 K/uL   RBC 3.60 (L) 3.87 - 5.11 MIL/uL   Hemoglobin 11.2 (L) 12.0 - 15.0 g/dL   HCT 32.6 (L) 36.0 - 46.0 %   MCV 90.6 78.0 - 100.0 fL   MCH 31.1 26.0 - 34.0 pg   MCHC 34.4 30.0 - 36.0 g/dL   RDW 14.5 11.5 - 15.5 %   Platelets 208 150 - 400 K/uL   Neutrophils Relative % 83 %   Neutro Abs 8.8 (H) 1.7 - 7.7 K/uL   Lymphocytes Relative 15 %   Lymphs Abs 1.5 0.7 - 4.0 K/uL   Monocytes Relative  2 %   Monocytes Absolute 0.2 0.1 - 1.0 K/uL   Eosinophils Relative 0 %   Eosinophils Absolute 0.0 0.0 - 0.7 K/uL   Basophils Relative 0 %   Basophils Absolute 0.0 0.0 - 0.1 K/uL    Radiology/Results: No results found.  Anti-infectives: Anti-infectives (From admission, onward)   Start     Dose/Rate Route Frequency Ordered Stop   04/30/17 1048  cefoTEtan in Dextrose 5% (CEFOTAN) IVPB 2 g     2 g Intravenous On call to O.R. 04/30/17 1048 04/30/17 1332      Assessment/Plan: Problem List: Patient Active Problem List   Diagnosis Date Noted  . 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 Essential hypertension 08/26/2016  . Dyspnea on exertion 08/24/2016  . Atypical chest pain 08/24/2016  . Rapid heartbeat 08/24/2016  . Insomnia 05/13/2016  . Post-operative state 05/25/2013  . Acute bronchitis 09/21/2012  . Obstructive sleep apnea 05/29/2012    Beginning liquids after sleeve gastrectomy 1 Day Post-Op    LOS: 1 day   Matt B. Hassell Done, MD, [facility] [phone] beeper [phone]  05/01/2017 10:04 AM

## 2017-05-01 NOTE — Progress Notes (Signed)
Patient able to tolerate protein, but has minimal fluid intake and states she feels shaky at times.  Discussed with Dr. Hassell Done.

## 2017-05-01 NOTE — Progress Notes (Signed)
Pt states that she will self administer CPAP when ready for bed.  RT to monitor and assess as needed.  

## 2017-05-01 NOTE — Plan of Care (Signed)
Nutrition Education Note  Received consult for diet education per DROP protocol.   Discussed 2 week post op diet with pt. Emphasized that liquids must be non carbonated, non caffeinated, and sugar free. Fluid goals discussed. Pt to follow up with outpatient bariatric RD for further diet progression after 2 weeks. Multivitamins and minerals also reviewed. Teach back method used, pt expressed understanding, expect good compliance.   Diet: First 2 Weeks  You will see the nutritionist about two (2) weeks after your surgery. The nutritionist will increase the types of foods you can eat if you are handling liquids well:  If you have severe vomiting or nausea and cannot handle clear liquids lasting longer than 1 day, call your surgeon  Protein Shake  Drink at least 2 ounces of shake 5-6 times per day  Each serving of protein shakes (usually 8 - 12 ounces) should have a minimum of:  15 grams of protein  And no more than 5 grams of carbohydrate  Goal for protein each day:  Men = 80 grams per day  Women = 60 grams per day  Protein powder may be added to fluids such as non-fat milk or Lactaid milk or Soy milk (limit to 35 grams added protein powder per serving)   Hydration  Slowly increase the amount of water and other clear liquids as tolerated (See Acceptable Fluids)  Slowly increase the amount of protein shake as tolerated  Sip fluids slowly and throughout the day  May use sugar substitutes in small amounts (no more than 6 - 8 packets per day; i.e. Splenda)   Fluid Goal  The first goal is to drink at least 8 ounces of protein shake/drink per day (or as directed by the nutritionist); some examples of protein shakes are Premier Protein, Syntrax Nectar, Adkins Advantage, EAS Edge HP, and Unjury. See handout from pre-op Bariatric Education Class:  Slowly increase the amount of protein shake you drink as tolerated  You may find it easier to slowly sip shakes throughout the day  It is important to  get your proteins in first  Your fluid goal is to drink 64 - 100 ounces of fluid daily  It may take a few weeks to build up to this  32 oz (or more) should be clear liquids  And  32 oz (or more) should be full liquids (see below for examples)  Liquids should not contain sugar, caffeine, or carbonation   Clear Liquids:  Water or Sugar-free flavored water (i.e. Fruit H2O, Propel)  Decaffeinated coffee or tea (sugar-free)  Crystal Lite, Wyler?s Lite, Minute Maid Lite  Sugar-free Jell-O  Bouillon or broth  Sugar-free Popsicle: *Less than 20 calories each; Limit 1 per day   Full Liquids:  Protein Shakes/Drinks + 2 choices per day of other full liquids  Full liquids must be:  No More Than 12 grams of Carbs per serving  No More Than 3 grams of Fat per serving  Strained low-fat cream soup  Non-Fat milk  Fat-free Lactaid Milk  Sugar-free yogurt (Dannon Lite & Fit, Greek yogurt, Oikos Zero)   Kiyon Fidalgo RD, LDN Clinical Nutrition Pager # - [phone]   

## 2017-05-01 NOTE — Progress Notes (Addendum)
Patient alert and oriented, Post op day 1.  Provided support and encouragement.  Encouraged pulmonary toilet, ambulation and small sips of liquids.  Patient completec 12 ounces of clear overnight and RN to start protein.  Patient does have c/o nausea receiving IV Zofran while BNC in room.  All questions answered.  Will continue to monitor.

## 2017-05-01 NOTE — Anesthesia Postprocedure Evaluation (Signed)
Anesthesia Post Note  Patient: Beth Sullivan  Procedure(s) Performed: LAPAROSCOPIC GASTRIC SLEEVE RESECTION WITH UPPER ENDO (N/A )     Patient location during evaluation: PACU Anesthesia Type: General Level of consciousness: awake and alert Pain management: pain level controlled Vital Signs Assessment: post-procedure vital signs reviewed and stable Respiratory status: spontaneous breathing, nonlabored ventilation and respiratory function stable Cardiovascular status: blood pressure returned to baseline and stable Postop Assessment: no apparent nausea or vomiting Anesthetic complications: no    Last Vitals:  Vitals:   05/01/17 0512 05/01/17 0900  BP: (!) 145/77 (!) 145/72  Pulse: 88 87  Resp: 14 16  Temp: 37 C 37.1 C  SpO2: 99% 94%    Last Pain:  Vitals:   05/01/17 0900  TempSrc: Oral  PainSc:                  Catalina Gravel

## 2017-05-02 LAB — CBC WITH DIFFERENTIAL/PLATELET
Basophils Absolute: 0 10*3/uL (ref 0.0–0.1)
Basophils Relative: 0 %
Eosinophils Absolute: 0 10*3/uL (ref 0.0–0.7)
Eosinophils Relative: 0 %
HEMATOCRIT: 31.8 % — AB (ref 36.0–46.0)
Hemoglobin: 10.8 g/dL — ABNORMAL LOW (ref 12.0–15.0)
LYMPHS ABS: 3.4 10*3/uL (ref 0.7–4.0)
LYMPHS PCT: 32 %
MCH: 31.1 pg (ref 26.0–34.0)
MCHC: 34 g/dL (ref 30.0–36.0)
MCV: 91.6 fL (ref 78.0–100.0)
MONOS PCT: 7 %
Monocytes Absolute: 0.7 10*3/uL (ref 0.1–1.0)
NEUTROS ABS: 6.5 10*3/uL (ref 1.7–7.7)
Neutrophils Relative %: 61 %
Platelets: 199 10*3/uL (ref 150–400)
RBC: 3.47 MIL/uL — ABNORMAL LOW (ref 3.87–5.11)
RDW: 14.8 % (ref 11.5–15.5)
WBC: 10.7 10*3/uL — ABNORMAL HIGH (ref 4.0–10.5)

## 2017-05-02 NOTE — Progress Notes (Signed)
Patient alert and oriented, pain is controlled. Patient is tolerating fluids, advanced to protein shake today, patient is tolerating well.  Reviewed Gastric sleeve discharge instructions with patient and patient is able to articulate understanding.  Provided information on BELT program, Support Group and [facility] outpatient pharmacy. All questions answered, will continue to monitor.  

## 2017-05-02 NOTE — Progress Notes (Signed)
Discharge instructions discussed with patient and family, verbalized agreement and understanding 

## 2017-05-02 NOTE — Discharge Summary (Signed)
Physician Discharge Summary  Patient ID: Beth Sullivan MRN: [id number] DOB/AGE: Sep 07, 1945 72 y.o.  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  Not drinking enough on Wednesday but ready for discharge on Thursday  Consults: none  Significant Diagnostic Studies: none    Discharge Exam: Blood pressure (!) 150/68, pulse 96, temperature 98.7 F (37.1 C), temperature source Oral, resp. rate 18, height 5\' 2"  (1.575 m), weight 111.4 kg (245 lb 9.6 oz), SpO2 100 %. Incisions ok  Disposition: 01-Home or Self Care  Discharge Instructions    Ambulate hourly while awake   Complete by:  As directed    Call MD for:  difficulty breathing, headache or visual disturbances   Complete by:  As directed    Call MD for:  persistant dizziness or light-headedness   Complete by:  As directed    Call MD for:  persistant nausea and vomiting   Complete by:  As directed    Call MD for:  redness, tenderness, or signs of infection (pain, swelling, redness, odor or green/yellow discharge around incision site)   Complete by:  As directed    Call MD for:  severe uncontrolled pain   Complete by:  As directed    Call MD for:  temperature >101 F   Complete by:  As directed    Diet bariatric full liquid   Complete by:  As directed    Discharge instructions   Complete by:  As directed    You may shower etc ad lib   Incentive spirometry   Complete by:  As directed    Perform hourly while awake     Allergies as of 05/02/2017      Reactions   Tramadol Nausea And Vomiting      Medication List    TAKE these medications   aspirin EC 81 MG tablet Take 81 mg by mouth at bedtime.   atorvastatin 10 MG tablet Commonly known as:  LIPITOR Take 10 mg by  mouth at bedtime.   betamethasone dipropionate 0.05 % cream Commonly known as:  DIPROLENE Apply 1 application topically daily.   CALCIUM 500 +D PO Take 1 tablet by mouth daily.   chlorthalidone 25 MG tablet Commonly known as:  HYGROTON Take 25 mg by mouth daily. Notes to patient:  Monitor Blood Pressure Daily and keep a log for primary care physician.  You may need to make changes to your medications with rapid weight loss.     cloNIDine 0.1 mg/24hr patch Commonly known as:  CATAPRES - Dosed in mg/24 hr Place 1 patch onto the skin every Sunday. Notes to patient:  Monitor Blood Pressure Daily and keep a log for primary care physician.  You may need to make changes to your medications with rapid weight loss.     Fish Oil 1200 MG Caps Take 1,200 mg by mouth 2 (two) times daily.   losartan 100 MG tablet Commonly known as:  COZAAR Take 100 mg by mouth daily. Notes to patient:  Monitor Blood Pressure Daily and keep a log for primary care physician.  You may need to make changes to your medications with rapid weight loss.     multivitamin with minerals Tabs tablet Take 1 tablet by mouth daily.   spironolactone  25 MG tablet Commonly known as:  ALDACTONE Take 25 mg by mouth daily. Notes to patient:  Monitor Blood Pressure Daily and keep a log for primary care physician.  Monitor for symptoms of dehydration.  You may need to make changes to your medications with rapid weight loss.     verapamil 240 MG (CO) 24 hr tablet Commonly known as:  COVERA HS Take 240 mg by mouth 2 (two) times daily. Notes to patient:  Monitor Blood Pressure Daily and keep a log for primary care physician.  You may need to make changes to your medications with rapid weight loss.     vitamin C 500 MG tablet Commonly known as:  ASCORBIC ACID Take 500 mg by mouth daily.   VITAMIN D PO Take 5,000 Units by mouth daily.      Follow-up Information    [facility], [place]. Go on 05/15/2017.   Specialty:   General Surgery Why:  at 1030am with Dr Johnathan Hausen Contact information: [address] [place] [address] [phone]        [facility], [place] Follow up.   Specialty:  General Surgery Contact information: [address] [place] [address] [phone]           Signed: Pedro Earls 05/02/2017, 8:17 AM

## 2017-05-06 ENCOUNTER — Telehealth (HOSPITAL_COMMUNITY): Payer: Self-pay

## 2017-05-06 NOTE — Telephone Encounter (Addendum)
Patient called to discuss post bariatric surgery follow up questions.  See below:   1.  Tell me about your pain and pain management?patient has not had any pain requiring pain medication  2.  Let's talk about fluid intake.  How much total fluid are you taking in?no problems getting fluid in 64 ounces daily.  Still has some cramping with drinking.  We discussed why that occurs   3.  How much protein have you taken in the last 2 days?patient getting 60 grams in consistently   4.  Have you had nausea?  Tell me about when have experienced nausea and what you did to help?No nausea experiences  5.  Has the frequency or color changed with your urine?urine color light no change in pattern  6.  Tell me what your incisions look like?incisions look good the upper incision had clear fluid.  She cleaned with alcohol no redness or further draining. Ecouraged to call if fluid changed color or odor or if incision opened up  7.  Have you been passing gas? BM?Had BM Saturday.  We discussed continued use of Miralax if needed and to avoid becoming constipated  8.  If a problem or question were to arise who would you call?  Do you know contact numbers for [facility], [facility], and [facility]?patient is aware of [facility] appointment on 1/29 at 330 and is aware of how to call all post surgery support numbers  9.  How has the walking going?she has been walking every hour in her home.  Due to temperature outside she has not ventured out to walk  10.  How are your vitamins and calcium going?  How are you taking them?No problems with vitamins she is taking [place] once per day without difficulty and 3 calcium.  She was able to verbalize the correct schedule for vitamins and calcium  We discussed support group dates and times.  Encouraged patient to attend.

## 2017-05-07 ENCOUNTER — Ambulatory Visit: Payer: Medicare Other | Admitting: Internal Medicine

## 2017-05-14 ENCOUNTER — Encounter: Payer: Medicare Other | Attending: Surgery | Admitting: Skilled Nursing Facility1

## 2017-05-14 DIAGNOSIS — Z713 Dietary counseling and surveillance: Secondary | ICD-10-CM | POA: Insufficient documentation

## 2017-05-14 DIAGNOSIS — Z6841 Body Mass Index (BMI) 40.0 and over, adult: Secondary | ICD-10-CM | POA: Diagnosis not present

## 2017-05-14 DIAGNOSIS — E669 Obesity, unspecified: Secondary | ICD-10-CM

## 2017-05-20 ENCOUNTER — Ambulatory Visit
Admission: RE | Admit: 2017-05-20 | Discharge: 2017-05-20 | Disposition: A | Discharge status: Home / Self Care | Payer: Medicare Other | Source: Ambulatory Visit | Attending: Family Medicine | Admitting: Family Medicine

## 2017-05-20 ENCOUNTER — Encounter: Payer: Self-pay | Admitting: Skilled Nursing Facility1

## 2017-05-20 DIAGNOSIS — Z1231 Encounter for screening mammogram for malignant neoplasm of breast: Secondary | ICD-10-CM

## 2017-05-20 NOTE — Progress Notes (Signed)
Bariatric Class:  Appt start time: 1530 end time:  1630.  2 Week Post-Operative Nutrition Class  Patient was seen on 05/15/2017 for Post-Operative Nutrition education at [facility].   Surgery date: 04/30/2017 Surgery type: sleeve Start weight at [facility]: 261.6 Weight today: 239  TANITA  BODY COMP RESULTS  05/15/2017   BMI (kg/m^2) 43.7   Fat Mass (lbs) 129.8   Fat Free Mass (lbs) 109.2   Total Body Water (lbs) 79.2   The following the learning objectives were met by the patient during this course:  Identifies Phase 3A (Soft, High Proteins) Dietary Goals and will begin from 2 weeks post-operatively to 2 months post-operatively  Identifies appropriate sources of fluids and proteins   States protein recommendations and appropriate sources post-operatively  Identifies the need for appropriate texture modifications, mastication, and bite sizes when consuming solids  Identifies appropriate multivitamin and calcium sources post-operatively  Describes the need for physical activity post-operatively and will follow MD recommendations  States when to call healthcare provider regarding medication questions or post-operative complications  Handouts given during class include:  Phase 3A: Soft, High Protein Diet Handout  Follow-Up Plan: Patient will follow-up at [facility] in 6 weeks for 2 month post-op nutrition visit for diet advancement per MD.

## 2017-06-25 ENCOUNTER — Encounter: Payer: Medicare Other | Attending: Surgery | Admitting: Registered"

## 2017-06-25 ENCOUNTER — Encounter: Payer: Self-pay | Admitting: Registered"

## 2017-06-25 DIAGNOSIS — Z713 Dietary counseling and surveillance: Secondary | ICD-10-CM | POA: Diagnosis not present

## 2017-06-25 DIAGNOSIS — Z6841 Body Mass Index (BMI) 40.0 and over, adult: Secondary | ICD-10-CM | POA: Diagnosis not present

## 2017-06-25 DIAGNOSIS — E669 Obesity, unspecified: Secondary | ICD-10-CM

## 2017-06-25 NOTE — Patient Instructions (Signed)
Goals:  Follow Phase 3B: High Protein + Non-Starchy Vegetables  Eat 3-6 small meals/snacks, every 3-5 hrs  Increase lean protein foods to meet 60g goal  Increase fluid intake to 64oz +  Avoid drinking 15 minutes before, during and 30 minutes after eating  Aim for >30 min of physical activity daily  

## 2017-06-25 NOTE — Progress Notes (Signed)
Follow-up visit:  8 Weeks Post-Operative Sleeve gastrectomy Surgery  Medical Nutrition Therapy:  Appt start time: 11:00 end time:  11:30.  Primary concerns today: Post-operative Bariatric Surgery Nutrition Management.  Non scale victories: decreased sweet cravings  Surgery date: 04/30/2017 Surgery type: Sleeve [facility] weight at [facility]: 261.6 Weight today: 224.2 Weight change: 14.8 lbs from 239 lbs (05/15/2017) Total weight lost: 37.4 lbs Weight loss goal: "get myself healthy", improve quality of life and be here for grandchildren   TANITA  BODY COMP RESULTS  05/15/2017 06/25/2017   BMI (kg/m^2) 43.7 41.0   Fat Mass (lbs) 129.8 120.2   Fat Free Mass (lbs) 109.2 104.0   Total Body Water (lbs) 79.2 74.8   Pt states she has had only 1 craving for sweets since surgery. Pt states she knows when to stop eating. Pt states it takes her about 15 minutes to eat a meal. Pt states she plans to attend [employer] MWF as well continue to swim.   Preferred Learning Style:   No preference indicated   Learning Readiness:   Ready  Change in progress  24-hr recall: B (AM): boiled egg (6g) or sausage patty (7g) Snk (AM): cheese stick (6g)  L (PM): 2-3 oz salmon, chicken, chili (14-21g) Snk (PM): cottage cheese  D (PM): 2-3 oz salmon, chicken, chili (14-21g) Snk (PM): cheesestick (6g)  Fluid intake: 2% milk, unsweetened almond milk, water with flavor, protein2o; 64+ oz Estimated total protein intake: 60+ grams  Medications: See list Supplementation: [employer] chewable + 3 calcium capsules  Using straws: no Drinking while eating: no Having you been chewing well:yes  Chewing/swallowing difficulties: no Changes in vision: no Changes to mood/headaches: no Hair loss/Changes to skin/Changes to nails: no, no, no Any difficulty focusing or concentrating: no Sweating: no Dizziness/Lightheaded: no Palpitations: no  Carbonated beverages: no N/V/D/C/GAS: improving, no, no, no, no Abdominal  Pain: no Dumping syndrome: no Last Lap-Band fill: N/A  Recent physical activity: water aerobics 1-2 hours, 3 days/week  Progress Towards Goal(s):  In progress.  Handouts given during visit include:  Phase III: High protein + NS vegetables   Nutritional Diagnosis:  [place]-3.3 Overweight/obesity related to past poor dietary habits and physical inactivity as evidenced by patient w/ recent sleeve gastrectomy surgery following dietary guidelines for continued weight loss.     Intervention:  Nutrition education and counseling. Goals:  Follow Phase 3B: High Protein + Non-Starchy Vegetables  Eat 3-6 small meals/snacks, every 3-5 hrs  Increase lean protein foods to meet 60g goal  Increase fluid intake to 64oz +  Avoid drinking 15 minutes before, during and 30 minutes after eating  Aim for >30 min of physical activity   Teaching Method Utilized:  Visual Auditory Hands on  Barriers to learning/adherence to lifestyle change: none identified  Demonstrated degree of understanding via:  Teach Back   Monitoring/Evaluation:  Dietary intake, exercise, lap band fills, and body weight. Follow up in 4 months for 6 month post-op visit.

## 2017-07-18 ENCOUNTER — Ambulatory Visit: Payer: Medicare Other | Admitting: Internal Medicine

## 2017-07-18 ENCOUNTER — Encounter: Payer: Self-pay | Admitting: Internal Medicine

## 2017-07-18 VITALS — BP 106/62 | HR 76 | Wt 216.4 lb

## 2017-07-18 DIAGNOSIS — G4733 Obstructive sleep apnea (adult) (pediatric): Secondary | ICD-10-CM | POA: Diagnosis not present

## 2017-07-18 DIAGNOSIS — Z9884 Bariatric surgery status: Secondary | ICD-10-CM | POA: Diagnosis not present

## 2017-07-18 NOTE — Patient Instructions (Signed)
Order- please schedule unattended home sleep test   Please call me about 2 weeks after the sleep test for results and recommendations. If you still need CPAP, then we can see about replacing your old machine with a self-adjusting one.

## 2017-07-18 NOTE — Assessment & Plan Note (Signed)
Already significant weight loss, but a long way to go before her stated goal of 175 lbs.

## 2017-07-18 NOTE — Assessment & Plan Note (Signed)
She has not been compliant recently.  Already significant weight loss.  She agrees to dating sleep study.  If she still needs treatment we can try for replacement machine changing to AutoPap, or possibly an oral appliance. -Home sleep test

## 2017-07-18 NOTE — Progress Notes (Signed)
HPI F never smoker seen for OSA. NPSG 05/23/12- AHI 42.6 per hour. Weight 267 pounds . --------------------------------------------------------------------  05/07/2016-72 year old female never smoker followed for OSA, Insomnia, complicated by morbid obesity CPAP 14/APS > 13 today FOLLOWS FOR:APS. DL attached. Pt wears CPAP most every night-occasionally forgets when staying with her grandchild. Needs new order for supplies. Nasal pillows mask leaks and she adjusted several times a night. She feels pressure is comfortable. Has been leaving CPAP at home which she goes to care for her grandchild overnight. Denies daytime sleepiness "too much to do". Takes hydrocodone most mornings for somatic pains.  07/18/17- 72 year old female never smoker followed for OSA, Insomnia, complicated by morbid obesity CPAP 13/APS  Had gastric sleeve bariatric surgery in January and has lost some weight.  Husband does not tell her she snores.  She has been skipping CPAP, hoping she is no longer needs it. To see ENT about persistent hoarseness after intubation for surgery.  Otherwise breathing well and denies heart problems.  ROS-see HPI   + = positive Constitutional:   +   weight loss, night sweats, fevers, chills, fatigue, lassitude. HEENT:   No-  headaches, difficulty swallowing, tooth/dental problems, sore throat,       No-  sneezing, itching, ear ache, +nasal congestion, post nasal drip,  CV:  No-   chest pain, orthopnea, PND, swelling in lower extremities, anasarca, dizziness, palpitations Resp: +shortness of breath with exertion or at rest.       No-   productive cough,  No non-productive cough,  No- coughing up of blood.              No-   change in color of mucus.  No- wheezing.   Skin: No-   rash or lesions. GI:  No-   heartburn, indigestion, abdominal pain, nausea, vomiting,  GU:  MS:  +  joint pain or swelling.   Neuro-     nothing unusual Psych:  No- change in mood or affect. No depression or anxiety.   No memory loss.  OBJ- Physical Exam General- Alert, Oriented, Affect-appropriate, Distress- none acute. + Obese Skin- rash-none, lesions- none, excoriation- none Lymphadenopathy- none Head- atraumatic            Eyes- Gross vision intact, PERRLA, conjunctivae and secretions clear            Ears- Hearing, canals-normal            Nose- Clear, no-Septal dev, mucus, polyps, erosion, perforation             Throat- Mallampati II-III , mucosa clear , drainage- none, tonsils- atrophic, + mild hoarseness Neck- flexible , trachea midline, no stridor , thyroid nl, carotid no bruit Chest - symmetrical excursion , unlabored           Heart/CV- RRR , no murmur , no gallop  , no rub, nl s1 s2                           - JVD- none , edema- none, stasis changes- none, varices- none           Lung- clear to P&A, wheeze- none, cough-none, dullness-none, rub- none           Chest wall-  Abd-  Br/ Gen/ Rectal- Not done, not indicated Extrem- cyanosis- none, clubbing, none, atrophy- none, strength- nl Neuro- grossly intact to observation

## 2017-08-08 DIAGNOSIS — G4733 Obstructive sleep apnea (adult) (pediatric): Secondary | ICD-10-CM | POA: Diagnosis not present

## 2017-08-09 DIAGNOSIS — G4733 Obstructive sleep apnea (adult) (pediatric): Secondary | ICD-10-CM | POA: Diagnosis not present

## 2017-08-12 ENCOUNTER — Other Ambulatory Visit: Payer: Self-pay | Admitting: *Deleted

## 2017-08-12 ENCOUNTER — Other Ambulatory Visit: Payer: Self-pay | Admitting: Otolaryngology

## 2017-08-12 DIAGNOSIS — G4733 Obstructive sleep apnea (adult) (pediatric): Secondary | ICD-10-CM

## 2017-08-23 ENCOUNTER — Telehealth: Payer: Self-pay | Admitting: Internal Medicine

## 2017-08-23 DIAGNOSIS — G4733 Obstructive sleep apnea (adult) (pediatric): Secondary | ICD-10-CM

## 2017-08-23 NOTE — Telephone Encounter (Signed)
Called and spoke with pt letting her know the results of the HST and stated to continue on CPAP.  Stated to her I was sending an order to have her old machine replaced.  Pt expressed understanding. Sent order to pt's DME to replace old machine and stated settings in order as well.  Pt currently has an appt scheduled with CY.  Nothing further needed at this time.

## 2017-08-23 NOTE — Telephone Encounter (Signed)
Her home sleep test still shows moderate obstructive sleep apnea, averaging 25.4 apneas/ hour with drops in blood oxygen level.  This has improved with her weight loss, from her original study when she was severe, averaging over 40/ hr. Her current test is still significantly abnormal. As discussed at her last visit, I recommend she continue CPAP.  Order her DME, replace old CPAP machine, change to auto 5-20, mask of choice, humidifier, supplies, AirView,  dx OSA  Please make sure she has a return ov in 31-90 days per insurance guidelines.

## 2017-08-23 NOTE — Telephone Encounter (Signed)
Dr. Annamaria Boots, can you review HST results? I didn't see any notes regarding this. Thank you.

## 2017-10-23 ENCOUNTER — Ambulatory Visit: Payer: Medicare Other | Admitting: Internal Medicine

## 2017-10-23 ENCOUNTER — Encounter: Payer: Self-pay | Admitting: Internal Medicine

## 2017-10-23 VITALS — BP 124/70 | HR 66 | Ht 62.0 in | Wt 194.0 lb

## 2017-10-23 DIAGNOSIS — G4733 Obstructive sleep apnea (adult) (pediatric): Secondary | ICD-10-CM

## 2017-10-23 NOTE — Assessment & Plan Note (Signed)
Lifestyle change sufficient to accomplish meaningful weight loss would help management of her sleep apnea and general health.

## 2017-10-23 NOTE — Assessment & Plan Note (Signed)
She continues to benefit from CPAP with better sleep.  We discussed mask fit and options. Plan-replace old machine, changing to AutoPap 10- 20 discussed.

## 2017-10-23 NOTE — Patient Instructions (Signed)
Order- DME APS-   Please replace old CPAP machine, change to auto 10-20, mask of choice, humidifier, supplies, Airview    Dx OSA  Please call us if we can help

## 2017-10-23 NOTE — Progress Notes (Signed)
HPI F never smoker seen for OSA. NPSG 05/23/12- AHI 42.6 per hour. Weight 267 pounds . 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  Husband does not tell her she snores.  She has been skipping CPAP, hoping she is no longer needs it. To see ENT about persistent hoarseness after intubation for surgery.  Otherwise breathing well and denies heart problems.  10/23/2017- 72 year old female never smoker followed for OSA, Insomnia, complicated by morbid obesity CPAP 13/APS -----OSA: DME APS Pt wears CPAP nightly and unable to  get DL today-AirView is down for downloads. Pt would like to have order for new CPAP machine with supplies and Airview.  Mask occasionally leaks-nasal pillows-but otherwise she is very comfortable with CPAP and uses it all night every night with no break in therapy.  She is ready to replace with new her machine and we discussed AutoPap.  ROS-see HPI   + = positive Constitutional:   +   weight loss, night sweats, fevers, chills, fatigue, lassitude. HEENT:   No-  headaches, difficulty swallowing, tooth/dental problems, sore throat,       No-  sneezing, itching, ear ache, +nasal congestion, post nasal drip,  CV:  No-   chest pain, orthopnea, PND, swelling in lower extremities, anasarca, dizziness, palpitations Resp: +shortness of breath with exertion or at rest.       No-   productive cough,  No non-productive cough,  No- coughing up of blood.              No-   change in color of mucus.  No- wheezing.   Skin: No-   rash or lesions. GI:  No-   heartburn, indigestion, abdominal pain, nausea, vomiting,  GU:  MS:  +  joint pain or swelling.   Neuro-     nothing unusual Psych:  No- change in mood or affect. No depression or anxiety.  No memory loss.  OBJ- Physical  Exam General- Alert, Oriented, Affect-appropriate, Distress- none acute. + Obese Skin- rash-none, lesions- none, excoriation- none Lymphadenopathy- none Head- atraumatic            Eyes- Gross vision intact, PERRLA, conjunctivae and secretions clear            Ears- Hearing, canals-normal            Nose- Clear, no-Septal dev, mucus, polyps, erosion, perforation             Throat- Mallampati II-III , mucosa clear , drainage- none, tonsils- atrophic, + mild hoarseness Neck- flexible , trachea midline, no stridor , thyroid nl, carotid no bruit Chest - symmetrical excursion , unlabored           Heart/CV- RRR , no murmur , no gallop  , no rub, nl s1 s2                           - JVD- none , edema- none, stasis changes- none, varices- none           Lung- clear to P&A, wheeze- none, cough-none, dullness-none, rub- none           Chest wall-  Abd-  Br/ Gen/ Rectal- Not done, not indicated Extrem- cyanosis- none, clubbing, none, atrophy- none, strength- nl Neuro- grossly intact to observation

## 2017-10-25 ENCOUNTER — Encounter: Payer: Self-pay | Admitting: Registered"

## 2017-10-25 ENCOUNTER — Encounter: Payer: Medicare Other | Attending: Surgery | Admitting: Registered"

## 2017-10-25 DIAGNOSIS — Z713 Dietary counseling and surveillance: Secondary | ICD-10-CM | POA: Diagnosis not present

## 2017-10-25 DIAGNOSIS — E669 Obesity, unspecified: Secondary | ICD-10-CM

## 2017-10-25 DIAGNOSIS — Z6841 Body Mass Index (BMI) 40.0 and over, adult: Secondary | ICD-10-CM | POA: Insufficient documentation

## 2017-10-25 NOTE — Progress Notes (Signed)
Follow-up visit:  6 Months Post-Operative Sleeve gastrectomy Surgery  Medical Nutrition Therapy:  Appt start time: 10:05 end time:  10:40  Primary concerns today: Post-operative Bariatric Surgery Nutrition Management.  Non scale victories: decreased sweet cravings  Surgery date: 04/30/2017 Surgery type: Sleeve Start weight at [facility]: 261.6 Weight today: 195.6 Weight change: 28.6 lbs from 224.2 lbs (06/25/2017) Total weight lost: 66 lbs Weight loss goal: "get myself healthy", improve quality of life and be here for grandchildren, ~ 195 lbs  TANITA  BODY COMP RESULTS  05/15/2017 06/25/2017 10/25/2017   BMI (kg/m^2) 43.7 41.0 35.8   Fat Mass (lbs) 129.8 120.2 91.8   Fat Free Mass (lbs) 109.2 104.0 103.8   Total Body Water (lbs) 79.2 74.8 73.6    Pt states she is content where she is with weight. Pt states she is down to drinking only 2 protein shakes a week, really trying to eat more protein foods. Pt states she wants to change from chewable to capsule multivitamins. Pt states she tried some apple pie and tolerated well, but did not want to eat more. Pt states she weighs daily.   Preferred Learning Style:   No preference indicated   Learning Readiness:   Ready  Change in progress  24-hr recall: B (AM): boiled egg (6g) or sausage patty (7g) Snk (AM): cheese stick (6g)  L (PM): 2-3 oz salmon, chicken, chili (14-21g), sauteed vegetables Snk (PM): cottage cheese  D (PM): 2-3 oz salmon, chicken, chili (14-21g) Snk (PM): cheesestick (6g)  Fluid intake: 2% milk, unsweetened almond milk, water with flavor, protein2o; 64+ oz Estimated total protein intake: 60+ grams  Medications: See list Supplementation: Bariatric Advtance chewable + 2 calcium capsules  Using straws: no Drinking while eating: no Having you been chewing well:yes  Chewing/swallowing difficulties: no Changes in vision: no Changes to mood/headaches: no Hair loss/Changes to skin/Changes to nails: no, no,  no Any difficulty focusing or concentrating: no Sweating: no Dizziness/Lightheaded: no Palpitations: no  Carbonated beverages: no N/V/D/C/GAS: improving, no, no, no, no Abdominal Pain: no Dumping syndrome: no Last Lap-Band fill: N/A  Recent physical activity: [facility] 3x/week, water aerobics 1-2 hours, 2 days/week  Progress Towards Goal(s):  In progress.  Handouts given during visit include:  Phase V: High protein + All vegetables   Nutritional Diagnosis:  [place]-3.3 Overweight/obesity related to past poor dietary habits and physical inactivity as evidenced by patient w/ recent sleeve gastrectomy surgery following dietary guidelines for continued weight loss.     Intervention:  Nutrition education and counseling. Pt was educated and counseled on the next phase of bariatric post-op diet. Pt was counseled on the importance of not weighing daily, appropriate calcium supplement regimen,  increasing vegetable intake, and the role of fiber. Pt was in agreement with goals listed.  Goals: - Order [employer] multivitamin capsule.  - Aim for 1500 mg of calcium a day.  - Take 3 calcium supplements.  - Increase intake of raw vegetables.  - Aim to have vegetables with lunch and dinner.  - Decrease daily weigh-ins.  - Keep up the great work! You are doing great!  Teaching Method Utilized:  Visual Auditory Hands on  Barriers to learning/adherence to lifestyle change: none identified  Demonstrated degree of understanding via:  Teach Back   Monitoring/Evaluation:  Dietary intake, exercise, lap band fills, and body weight. Follow up in 3 months for 9 month post-op visit.

## 2017-10-25 NOTE — Patient Instructions (Addendum)
-   Order [employer] multivitamin capsule.   - Aim for 1500 mg of calcium a day.   - Take 3 calcium supplements.   - Increase intake of raw vegetables.   - Aim to have vegetables with lunch and dinner.   - Decrease daily weigh-ins.   - Keep up the great work! You are doing great!

## 2017-10-28 ENCOUNTER — Other Ambulatory Visit: Payer: Self-pay | Admitting: Family Medicine

## 2017-10-28 DIAGNOSIS — N6320 Unspecified lump in the left breast, unspecified quadrant: Secondary | ICD-10-CM

## 2017-10-30 ENCOUNTER — Other Ambulatory Visit: Payer: Self-pay | Admitting: Family Medicine

## 2017-10-30 DIAGNOSIS — E2839 Other primary ovarian failure: Secondary | ICD-10-CM

## 2017-11-01 ENCOUNTER — Ambulatory Visit
Admission: RE | Admit: 2017-11-01 | Discharge: 2017-11-01 | Disposition: A | Discharge status: Home / Self Care | Payer: Medicare Other | Source: Ambulatory Visit | Attending: Family Medicine | Admitting: Family Medicine

## 2017-11-01 DIAGNOSIS — N6320 Unspecified lump in the left breast, unspecified quadrant: Secondary | ICD-10-CM

## 2017-12-12 ENCOUNTER — Ambulatory Visit
Admission: RE | Admit: 2017-12-12 | Discharge: 2017-12-12 | Disposition: A | Discharge status: Home / Self Care | Payer: Medicare Other | Source: Ambulatory Visit | Attending: Family Medicine | Admitting: Family Medicine

## 2017-12-12 DIAGNOSIS — E2839 Other primary ovarian failure: Secondary | ICD-10-CM

## 2018-01-24 ENCOUNTER — Encounter: Payer: Self-pay | Admitting: Registered"

## 2018-01-24 ENCOUNTER — Encounter: Payer: Medicare Other | Attending: Surgery | Admitting: Registered"

## 2018-01-24 DIAGNOSIS — Z6841 Body Mass Index (BMI) 40.0 and over, adult: Secondary | ICD-10-CM | POA: Insufficient documentation

## 2018-01-24 DIAGNOSIS — Z713 Dietary counseling and surveillance: Secondary | ICD-10-CM | POA: Diagnosis not present

## 2018-01-24 DIAGNOSIS — E669 Obesity, unspecified: Secondary | ICD-10-CM

## 2018-01-24 NOTE — Patient Instructions (Signed)
-   Keep up the great work with everything!

## 2018-01-24 NOTE — Progress Notes (Signed)
Follow-up visit:  9 Months Post-Operative Sleeve gastrectomy Surgery  Medical Nutrition Therapy:  Appt start time: 10:00 end time:  10:30  Primary concerns today: Post-operative Bariatric Surgery Nutrition Management.  Non scale victories: decreased sweet cravings  Surgery date: 04/30/2017 Surgery type: Sleeve Start weight at [facility]: 261.6 Weight today: 188.4 Weight change: 7.2 lbs from 195.6 lbs (10/25/2017) Total weight lost: 73.4 lbs Weight loss goal: "get myself healthy", improve quality of life and be here for grandchildren, ~ 195 lbs  TANITA  BODY COMP RESULTS  05/15/2017 06/25/2017 10/25/2017 01/24/2018   BMI (kg/m^2) 43.7 41.0 35.8 34.5   Fat Mass (lbs) 129.8 120.2 91.8 91.4   Fat Free Mass (lbs) 109.2 104.0 103.8 97.0   Total Body Water (lbs) 79.2 74.8 73.6 68.4    Pt states she is very comfortable in the size clothes that she is wearing. Pt states she has decreased daily weighing to weighing only once a week. Pt states she eats what she wants and tolerating everything.Pt states she does not like salads as much as she used to. Pt states she has decreased drinking protein shakes to about once every 2 weeks. Pt states she has added in fruit and tolerating well. Pt states recent A1c is 5.7-5.8 which has decreased from 6.4. Pt is doing well.   Preferred Learning Style:   No preference indicated   Learning Readiness:   Ready  Change in progress  24-hr recall: B (AM): greek yogurt (15g) + nuts (7g) or boiled egg (6g) + sausage patty (7g) Snk (AM): none L (PM): 2-3 oz chicken, chili (14-21g) + spinach + corn Snk (PM): none D (PM): 2-3 oz salmon, chicken, chili (14-21g) Snk (PM): sometimes cheesestick (6g) or nuts (7g)  Fluid intake: unsweetened almond milk, water with flavor, protein2o, coffee; 64+ oz Estimated total protein intake: 60+ grams  Medications: See list Supplementation: [facility] MVI capsule + 3 calcium chewables (every other day per doctors order due  to calcium labs being elevated)  Using straws: no Drinking while eating: no Having you been chewing well: yes  Chewing/swallowing difficulties: no Changes in vision: no Changes to mood/headaches: no Hair loss/Changes to skin/Changes to nails: no, no, no Any difficulty focusing or concentrating: no Sweating: no Dizziness/Lightheaded: no Palpitations: no  Carbonated beverages: no N/V/D/C/GAS: no, no, no, sometimes-takes Miralax every other day, no Abdominal Pain: no Dumping syndrome: no Last Lap-Band fill: N/A  Recent physical activity: water aerobics 1-2 hours, 2 days/week and walking  Progress Towards Goal(s):  In progress.  Handouts given during visit include:  none   Nutritional Diagnosis:  [place]-3.3 Overweight/obesity related to past poor dietary habits and physical inactivity as evidenced by patient w/ recent sleeve gastrectomy surgery following dietary guidelines for continued weight loss.     Intervention:  Nutrition education and counseling. Pt was educated and counseled on the fiber, eating to nourish her body, maintaining the great habits she has already established.  Pt was in agreement with goals listed.  Goals: - Keep up the great work with everything!   Teaching Method Utilized:  Visual Auditory Hands on  Barriers to learning/adherence to lifestyle change: none identified  Demonstrated degree of understanding via:  Teach Back   Monitoring/Evaluation:  Dietary intake, exercise, lap band fills, and body weight. Follow up in 3 months for 12 month post-op visit.

## 2018-04-18 ENCOUNTER — Other Ambulatory Visit: Payer: Self-pay | Admitting: Family Medicine

## 2018-04-18 DIAGNOSIS — Z1231 Encounter for screening mammogram for malignant neoplasm of breast: Secondary | ICD-10-CM

## 2018-04-21 ENCOUNTER — Encounter: Payer: Medicare Other | Attending: Surgery | Admitting: Registered"

## 2018-04-21 ENCOUNTER — Encounter: Payer: Self-pay | Admitting: Registered"

## 2018-04-21 DIAGNOSIS — E669 Obesity, unspecified: Secondary | ICD-10-CM

## 2018-04-21 DIAGNOSIS — Z6841 Body Mass Index (BMI) 40.0 and over, adult: Secondary | ICD-10-CM | POA: Diagnosis not present

## 2018-04-21 DIAGNOSIS — Z713 Dietary counseling and surveillance: Secondary | ICD-10-CM | POA: Diagnosis not present

## 2018-04-21 NOTE — Patient Instructions (Signed)
-   Increase fiber intake with having vegetables with lunch and dinner.   - Aim to have carbohydrate with protein snack option.   - Keep up the great work!

## 2018-04-21 NOTE — Progress Notes (Signed)
Follow-up visit:  12 Months Post-Operative Sleeve gastrectomy Surgery  Medical Nutrition Therapy:  Appt start time: 11:30 end time: 11:55  Primary concerns today: Post-operative Bariatric Surgery Nutrition Management.  Non scale victories: decreased sweet cravings  Surgery date: 04/30/2017 Surgery type: Sleeve Start weight at [facility]: 261.6 Weight today: 193.0 Weight change: 4.6 lbs gain from 188.4 lbs (01/24/2018) Total weight lost: 68.6 lbs Weight loss goal: "get myself healthy", improve quality of life and be here for grandchildren, ~ 195 lbs  TANITA  BODY COMP RESULTS  05/15/2017 06/25/2017 10/25/2017 01/24/2018 04/21/2018   BMI (kg/m^2) 43.7 41.0 35.8 34.5 35.3   Fat Mass (lbs) 129.8 120.2 91.8 91.4 97.2   Fat Free Mass (lbs) 109.2 104.0 103.8 97.0 95.8   Total Body Water (lbs) 79.2 74.8 73.6 68.4 67.8    Pt states she is able to eat what she wants to eat, enjoyed the holidays without feelings of guilt or shame related to eating holiday options. Pt states she was able to listen to her body and know when she had enough to eat. Pt is doing well and maintaining.    Preferred Learning Style:   No preference indicated   Learning Readiness:   Ready  Change in progress  24-hr recall: B (AM): grits + sausage patty 7(g) + egg (6g)  Snk (AM): none L (PM): salad + grilled protein (21g) or 2-3 oz chicken, chili (14-21g) + spinach + corn Snk (PM): almonds or mixed nuts  D (PM): 2 chicken wings (14g) or 2-3 oz salmon, chicken, chili (14-21g) Snk (PM): sometimes cheesestick (6g) or nuts (7g)  Fluid intake: unsweetened almond milk, water with flavor, protein2o, coffee; 64+ oz Estimated total protein intake: 60+ grams  Medications: See list Supplementation: [facility] MVI capsule + 3 calcium tablets (every other day per doctors order due to calcium labs being elevated)  Using straws: no Drinking while eating: no Having you been chewing well: yes  Chewing/swallowing  difficulties: no Changes in vision: no Changes to mood/headaches: no Hair loss/Changes to skin/Changes to nails: no, no, no Any difficulty focusing or concentrating: no Sweating: no Dizziness/Lightheaded: no Palpitations: no  Carbonated beverages: no N/V/D/C/GAS: no, no, no, no, no Abdominal Pain: no Dumping syndrome: no Last Lap-Band fill: N/A  Recent physical activity: water aerobics 1-2 hours, 4-5 days/week  Progress Towards Goal(s):  In progress.  Handouts given during visit include:  Phase VII: Maintenance Phase   Nutritional Diagnosis:  [place]-3.3 Overweight/obesity related to past poor dietary habits and physical inactivity as evidenced by patient w/ recent sleeve gastrectomy surgery following dietary guidelines for continued weight loss.     Intervention:  Nutrition education and counseling. Pt was educated and counseled on eating to nourish her body, balanced meals, and maintaining the great habits she has already established.  Pt was in agreement with goals listed.  Goals: - Increase fiber intake with having vegetables with lunch and dinner.  - Aim to have carbohydrate with protein snack option.  - Keep up the great work!  Teaching Method Utilized:  Visual Auditory Hands on  Barriers to learning/adherence to lifestyle change: none identified  Demonstrated degree of understanding via:  Teach Back   Monitoring/Evaluation:  Dietary intake, exercise, lap band fills, and body weight. Follow up in 6 months for 18 month post-op visit.

## 2018-04-29 ENCOUNTER — Telehealth: Payer: Self-pay | Admitting: Internal Medicine

## 2018-04-29 NOTE — Telephone Encounter (Signed)
Called and spoke with Patient.  She has been having wheezing, and cough, with occasional clear, green sputum.  She stated this started in September and she has seen her PCP x's 2, given a Z pak, and cough meds. She stated that her PCP told her she has bronchitis.  She stated that she is worse, not better. OV scheduled with Wyn Quaker, NP, at 0900, 04/30/18. Nothing further at this time.

## 2018-04-29 NOTE — Progress Notes (Signed)
@Patient  ID: Beth Sullivan, female    DOB: 09/08/1945, 73 y.o.   MRN: [id number]  Chief Complaint  Patient presents with  . Acute Visit    Cough    Referring provider: Leighton Ruff, MD  HPI:  73 year old female never smoker followed in our office for obstructive sleep apnea, insomnia  PMH: Morbid obesity Smoker/ Smoking History: Never smoker Maintenance: None Pt of: Dr. Annamaria Boots  04/30/2018  - Visit   73 year old female never smoker presenting to our office today for evaluation of cough.  Patient reports she is had this cough since September/2019.  Patient reports that the cough worsened significantly during October and November but has not improved since then.  Patient was treated with a Z-Pak in October/2019 and had 2 rounds of Tessalon Perles which did work to help suppress the cough if she took them every 8 hours.  Patient is also endorsing wheezing at rest and with exertion.  Patient does not have a rescue inhaler is never been diagnosed with asthma before.  Patient reports that there is no seasonal aspect to her symptoms this is the first time that she is ever had a cough like this.  Patient is currently not using any over-the-counter medications that she did not know what would work well for her blood pressure.  Patient reports that his cough is typically dry but she does have a productive cough in the early morning which is typically green mucus.  She reports she is had a productive morning cough for the last 5 years with green mucus.  She feels that the mucus is stuck.  Patient reports that she is remained adherent to her CPAP use.  She reports no issues using that at this time.  See Cough ROS below.    Tests:  NPSG 05/23/12- AHI 42.6 per hour. Weight 267 pounds >>>CPAP 13/APS  FENO:  No results found for: NITRICOXIDE  PFT: No flowsheet data found.  Imaging: No results found.    Specialty Problems      Pulmonary Problems   Obstructive sleep apnea    Remote  diagnosis. Noncompliant with CPAP originally. Complicated by hypertension. NPSG 05/23/12- AHI 42.6 per hour. Weight 267 pounds CPAP 12/APS      Acute bronchitis   Dyspnea on exertion   Upper airway cough syndrome      Allergies  Allergen Reactions  . Tramadol Nausea And Vomiting    Immunization History  Administered Date(s) Administered  . Influenza Split 01/15/2012, 01/27/2013, 02/15/2016  . Influenza, High Dose Seasonal PF 01/14/2017, 02/03/2018  . Zoster 09/03/2012  . Zoster Recombinat (Shingrix) 01/15/2017, 03/28/2017    Past Medical History:  Diagnosis Date  . Allergic rhinitis   . Chronic headaches   . Chronic pain disorder    From long-standing cervical spine disease - chronic pain management, history of  . CKD (chronic kidney disease), stage II    Per PCP notes - Cr 0.9-1.3  . GERD (gastroesophageal reflux disease)   . History of colon polyps 02/2014   Dr. Lizbeth Bark  . Hyperlipemia   . Hypertension   . Morbid obesity with BMI of 45.0-49.9, adult ([place])   . Pre-diabetes   . Sleep apnea    uses a cpap - Dr. Annamaria Boots    Tobacco History: Social History   Tobacco Use  Smoking Status Never Smoker  Smokeless Tobacco Never Used   Counseling given: Yes  Continue to not smoke  Outpatient Encounter Medications as of 04/30/2018  Medication Sig  .  aspirin EC 81 MG tablet Take 81 mg by mouth at bedtime.  Marland Kitchen atorvastatin (LIPITOR) 10 MG tablet Take 10 mg by mouth at bedtime.   . betamethasone dipropionate (DIPROLENE) 0.05 % cream Apply 1 application topically daily.  . Calcium Carb-Cholecalciferol (CALCIUM 500 +D PO) Take 1 tablet by mouth daily.   . chlorthalidone (HYGROTON) 25 MG tablet Take 25 mg by mouth daily.  . Cholecalciferol (VITAMIN D PO) Take 5,000 Units by mouth daily.   . cloNIDine (CATAPRES - DOSED IN MG/24 HR) 0.1 mg/24hr patch Place 1 patch onto the skin every Sunday.   . losartan (COZAAR) 100 MG tablet Take 100 mg by mouth daily.  . Multiple Vitamin  (MULTIVITAMIN WITH MINERALS) TABS tablet Take 1 tablet by mouth daily.  Marland Kitchen spironolactone (ALDACTONE) 25 MG tablet Take 25 mg by mouth daily.  . verapamil (COVERA HS) 240 MG (CO) 24 hr tablet Take 240 mg by mouth 2 (two) times daily.   . vitamin C (ASCORBIC ACID) 500 MG tablet Take 500 mg by mouth daily.  . benzonatate (TESSALON) 200 MG capsule Take 1 capsule (200 mg total) by mouth 3 (three) times daily as needed for cough.  . Omega-3 Fatty Acids (FISH OIL) 1200 MG CAPS Take 1,200 mg by mouth 2 (two) times daily.   . predniSONE (DELTASONE) 10 MG tablet 4 tabs for 2 days, then 3 tabs for 2 days, 2 tabs for 2 days, then 1 tab for 2 days, then stop   No facility-administered encounter medications on file as of 04/30/2018.      Review of Systems  Review of Systems  Constitutional: Positive for fatigue. Negative for chills and fever.  HENT: Positive for congestion. Negative for sinus pressure, sinus pain and sore throat.   Respiratory: Positive for cough (mainly dry, productive in am ) and wheezing (at rest). Negative for shortness of breath.   Cardiovascular: Negative for chest pain and palpitations.  Gastrointestinal: Negative for diarrhea, nausea and vomiting.       Denies epigastric pain or acid reflux  Psychiatric/Behavioral: Negative for dysphoric mood. The patient is not nervous/anxious.     Cough ROS:   When to the symptoms start: Sept/2020 How are you today: October/november was worse, not getting better  Have you had fever/sore throat (first 5 to 7 days of URI) or Have you had cough/nasal congestion (10 to 14 days of URI) : cough and nasal congestion  Have you used anything to treat the cough, as anything improved : Is it a dry or wet cough: mainly dry, productive in am - green mucous (been this way for 5 years) Does the cough happen when your breathing or when you breathe out: unsure Other any triggers to your cough, or any aggravating factors: at night laying down  Daily  antihistamine: none  GERD treatment: none Singulair: none  Cough checklist (bolded indicates presence):  Adherence, acid reflux, ACE inhibitor, active sinus disease, active smoking, adverse effects of medications (amiodarone/Macrodantin/bb), alpha 1, allergies, aspiration, anxiety, bronchiectasis, congestive heart failure (diastolic)   Physical Exam  BP 114/64 (BP Location: Left Arm, Cuff Size: Normal)   Pulse 84   Temp 97.8 F (36.6 C) (Oral)   Ht 5\' 2"  (1.575 m)   Wt 194 lb 12.8 oz (88.4 kg)   SpO2 94%   BMI 35.63 kg/m   Wt Readings from Last 5 Encounters:  04/30/18 194 lb 12.8 oz (88.4 kg)  04/21/18 193 lb (87.5 kg)  01/24/18 188 lb 6.4 oz (85.5  kg)  10/25/17 195 lb 9.6 oz (88.7 kg)  10/23/17 194 lb (88 kg)    Physical Exam  Constitutional: She is oriented to person, place, and time and well-developed, well-nourished, and in no distress. No distress.  HENT:  Head: Normocephalic and atraumatic.  Right Ear: Hearing, tympanic membrane, external ear and ear canal normal.  Left Ear: Hearing, tympanic membrane, external ear and ear canal normal.  Nose: Mucosal edema and rhinorrhea present. Right sinus exhibits no maxillary sinus tenderness and no frontal sinus tenderness. Left sinus exhibits no maxillary sinus tenderness and no frontal sinus tenderness.  Mouth/Throat: Uvula is midline and oropharynx is clear and moist. She does not have dentures. Normal dentition. No oropharyngeal exudate.  + Postnasal drip  Eyes: Pupils are equal, round, and reactive to light.  Neck: Normal range of motion. Neck supple.  Cardiovascular: Normal rate, regular rhythm and normal heart sounds.  Pulmonary/Chest: Effort normal. No accessory muscle usage. No respiratory distress. She has no decreased breath sounds. She has wheezes (End expiratory wheeze). She has no rhonchi.  Musculoskeletal: Normal range of motion.        General: No edema.  Lymphadenopathy:    She has no cervical adenopathy.    Neurological: She is alert and oriented to person, place, and time. Gait normal.  Skin: Skin is warm and dry. She is not diaphoretic.  Psychiatric: Mood, memory, affect and judgment normal.  Nursing note and vitals reviewed.   Office spirometry today: FVC 2.2 (103% predicted), ratio 78, FEV1 1.7 (104% predicted), normal spirometry   Lab Results:  CBC    Component Value Date/Time   WBC 10.7 (H) 05/02/2017 0519   RBC 3.47 (L) 05/02/2017 0519   HGB 10.8 (L) 05/02/2017 0519   HCT 31.8 (L) 05/02/2017 0519   PLT 199 05/02/2017 0519   MCV 91.6 05/02/2017 0519   MCH 31.1 05/02/2017 0519   MCHC 34.0 05/02/2017 0519   RDW 14.8 05/02/2017 0519   LYMPHSABS 3.4 05/02/2017 0519   MONOABS 0.7 05/02/2017 0519   EOSABS 0.0 05/02/2017 0519   BASOSABS 0.0 05/02/2017 0519    BMET    Component Value Date/Time   NA 134 (L) 04/24/2017 0904   K 4.4 04/24/2017 0904   CL 101 04/24/2017 0904   CO2 26 04/24/2017 0904   GLUCOSE 94 04/24/2017 0904   BUN 42 (H) 04/24/2017 0904   CREATININE 1.03 (H) 04/24/2017 0904   CALCIUM 9.5 04/24/2017 0904   GFRNONAA 54 (L) 04/24/2017 0904   GFRAA >60 04/24/2017 0904    BNP No results found for: BNP  ProBNP No results found for: PROBNP    Assessment & Plan:     Obstructive sleep apnea Assessment: BMI 35.6 Patient reports adherence with no issues using her CPAP  Plan: Continue CPAP use as prescribed    Upper airway cough syndrome Assessment: Cough since September/2019 1 round of antibiotic use Z-Pak which did lessen the severity of the cough but the cough persists Patient denies history of asthma or seasonal allergies Patient endorsed improvement with management of cough with Tessalon Perles Cough is mainly dry Patient denies epigastric pain or GERD symptoms End expiratory wheeze on exam today  Plan: Spirometry today Chest x-ray today Prednisone today We will hold off antibiotic coverage as patient is afebrile, is mainly a dry  cough Start daily antihistamine Refill Tessalon Perles Patient okay to use over-the-counter Mucinex Review cough instructions on AVS Remain hydrated Follow-up with our office in 2 to 4 weeks to ensure  symptoms are improving      Lauraine Rinne, NP 04/30/2018   This appointment was 28 minutes long with over 50% of the time in direct face-to-face patient care, assessment, plan of care, and follow-up.

## 2018-04-30 ENCOUNTER — Ambulatory Visit (INDEPENDENT_AMBULATORY_CARE_PROVIDER_SITE_OTHER)
Admission: RE | Admit: 2018-04-30 | Discharge: 2018-04-30 | Disposition: A | Discharge status: Home / Self Care | Payer: Medicare Other | Source: Ambulatory Visit | Attending: Pulmonary Disease | Admitting: Pulmonary Disease

## 2018-04-30 ENCOUNTER — Other Ambulatory Visit: Payer: Self-pay | Admitting: Pulmonary Disease

## 2018-04-30 ENCOUNTER — Encounter: Payer: Self-pay | Admitting: Pulmonary Disease

## 2018-04-30 ENCOUNTER — Ambulatory Visit: Payer: Medicare Other | Admitting: Pulmonary Disease

## 2018-04-30 ENCOUNTER — Ambulatory Visit
Admission: RE | Admit: 2018-04-30 | Discharge: 2018-04-30 | Disposition: A | Discharge status: Home / Self Care | Payer: Medicare Other | Source: Ambulatory Visit | Attending: Pulmonary Disease | Admitting: Pulmonary Disease

## 2018-04-30 VITALS — BP 114/64 | HR 84 | Temp 97.8°F | Ht 62.0 in | Wt 194.8 lb

## 2018-04-30 DIAGNOSIS — G4733 Obstructive sleep apnea (adult) (pediatric): Secondary | ICD-10-CM

## 2018-04-30 DIAGNOSIS — R058 Other specified cough: Secondary | ICD-10-CM

## 2018-04-30 DIAGNOSIS — R05 Cough: Secondary | ICD-10-CM

## 2018-04-30 MED ORDER — BENZONATATE 200 MG PO CAPS: 200.0000 mg | ORAL_CAPSULE | Freq: Three times a day (TID) | ORAL | 1 refills | Status: DC | PRN

## 2018-04-30 MED ORDER — PREDNISONE 10 MG PO TABS: ORAL_TABLET | ORAL | 0 refills | Status: DC

## 2018-04-30 NOTE — Assessment & Plan Note (Signed)
Assessment: BMI 35.6 Patient reports adherence with no issues using her CPAP  Plan: Continue CPAP use as prescribed

## 2018-04-30 NOTE — Progress Notes (Signed)
Your chest x-ray results of come back.  Showing no acute changes.  No plan of care changes at this time.  Keep follow-up appointment.    Follow-up with our office if symptoms worsen or you do not feel like you are improving under her current regimen.  It was a pleasure taking care of you,  Brian Mack, FNP 

## 2018-04-30 NOTE — Assessment & Plan Note (Addendum)
Assessment: Cough since September/2019 1 round of antibiotic use Z-Pak which did lessen the severity of the cough but the cough persists Patient denies history of asthma or seasonal allergies Patient endorsed improvement with management of cough with Tessalon Perles Cough is mainly dry Patient denies epigastric pain or GERD symptoms End expiratory wheeze on exam today  Plan: Spirometry today Chest x-ray today Prednisone today We will hold off antibiotic coverage as patient is afebrile, is mainly a dry cough Start daily antihistamine Refill Tessalon Perles Patient okay to use over-the-counter Mucinex Review cough instructions on AVS Remain hydrated Follow-up with our office in 2 to 4 weeks to ensure symptoms are improving

## 2018-04-30 NOTE — Progress Notes (Signed)
Discussed results with patient in office.  Nothing further is needed at this time.  Brian Mack FNP  

## 2018-04-30 NOTE — Patient Instructions (Addendum)
Spirometry in office today  Chest x-ray today  Prednisone 10mg  tablet  >>>4 tabs for 2 days, then 3 tabs for 2 days, 2 tabs for 2 days, then 1 tab for 2 days, then stop >>>take with food  >>>take in the morning   Cough Home Instructions:  We believe you have a chronic/cyclical cough that is aggravated by reflux , coughing , and drainage.  . Goal is to not Cough or clear throat.  Marland Kitchen Avoid coughing or clearing throat by using:  o non-mint products/sugarless candy o Water o ice chips o Remember NO MINT PRODUCTS  . Medications to use:  o Mucinex DM 1-2 every 12 hrs or Delsym 2 tsp every 12 hrs for cough (These are Over the counter) o Tessalon Three times a day  As needed  Cough.  o Start daily antihistamine  o Chlor tabs 4mg  2 at bedtime  for nasal drip until cough is 100% cough free. (this medication is over the counter)  Please start taking a daily antihistamine:  >>>choose one of: zyrtec, claritin, allegra, or xyzal  >>>these are over the counter medications  >>>can choose generic option  >>>take daily  >>>this medication helps with allergies, post nasal drip, and cough     Follow-up with our office in 2-4 weeks to see if symptoms are improving, can present to our office sooner if symptoms are worsening   It is flu season:   >>>Remember to be washing your hands regularly, using hand sanitizer, be careful to use around herself with has contact with people who are sick will increase her chances of getting sick yourself. >>> Best ways to protect herself from the flu: Receive the yearly flu vaccine, practice good hand hygiene washing with soap and also using hand sanitizer when available, eat a nutritious meals, get adequate rest, hydrate appropriately   Please contact the office if your symptoms worsen or you have concerns that you are not improving.   Thank you for choosing [facility] for your healthcare, and for allowing [place] to partner with you on your healthcare  journey. I am thankful to be able to provide care to you today.   Wyn Quaker FNP-C

## 2018-05-02 ENCOUNTER — Telehealth: Payer: Self-pay | Admitting: Pulmonary Disease

## 2018-05-02 NOTE — Telephone Encounter (Signed)
05/02/2018 0853  90-day CPAP compliance report shows excellent compliance.  87-hour last 90 days used.  78 of those days greater than 4 hours.  Average usage 6 hours and 4 minutes.  APAP settings 10-20.  AHI 3.4.  Compliance report signed to be placed in patient's chart.  Wyn Quaker, FNP

## 2018-05-22 ENCOUNTER — Ambulatory Visit
Admission: RE | Admit: 2018-05-22 | Discharge: 2018-05-22 | Disposition: A | Discharge status: Home / Self Care | Payer: Medicare Other | Source: Ambulatory Visit | Attending: Family Medicine | Admitting: Family Medicine

## 2018-05-22 DIAGNOSIS — Z1231 Encounter for screening mammogram for malignant neoplasm of breast: Secondary | ICD-10-CM

## 2018-05-28 ENCOUNTER — Ambulatory Visit: Payer: Self-pay | Admitting: Pulmonary Disease

## 2018-08-14 ENCOUNTER — Telehealth: Payer: Self-pay | Admitting: Pulmonary Disease

## 2018-08-14 DIAGNOSIS — R058 Other specified cough: Secondary | ICD-10-CM

## 2018-08-14 DIAGNOSIS — R05 Cough: Secondary | ICD-10-CM

## 2018-08-14 MED ORDER — BENZONATATE 200 MG PO CAPS: 200.0000 mg | ORAL_CAPSULE | Freq: Three times a day (TID) | ORAL | 0 refills | Status: DC | PRN

## 2018-08-14 MED ORDER — PREDNISONE 10 MG PO TABS: ORAL_TABLET | ORAL | 0 refills | Status: DC

## 2018-08-14 NOTE — Telephone Encounter (Signed)
Ok to refill her tessalon perles  Ok to send Rx prednisone 10 mg, # 20, 4 X 2 DAYS, 3 X 2 DAYS, 2 X 2 DAYS, 1 X 2 DAYS

## 2018-08-14 NOTE — Telephone Encounter (Signed)
Returned call to patient and made aware [facility] has agreed to sent prednisone and tessalon perles. Rx sent to [facility] per patient request. Nothing further needed.

## 2018-08-14 NOTE — Telephone Encounter (Signed)
Called and spoke with pt who stated she began coughing again 5 days ago. Pt states she is not coughing up any mucus. Pt denies any complaints of SOB, chest tightness, body aches, chills, or fever.  Asked pt if she had tried any OTC meds such as mucinex or any cough meds and pt stated that she had not as she is hesitant taking OTC meds due to her blood pressure meds she currently takes.  Pt is requesting a refill of the prednisone Rx and tessalon Rx which she had been prescribed back in January 2020. Dr. Annamaria Boots, please advise if you are okay with [place] refilling these meds for pt. Thanks!  Allergies  Allergen Reactions  . Tramadol Nausea And Vomiting     Current Outpatient Medications:  .  aspirin EC 81 MG tablet, Take 81 mg by mouth at bedtime., Disp: , Rfl:  .  atorvastatin (LIPITOR) 10 MG tablet, Take 10 mg by mouth at bedtime. , Disp: , Rfl:  .  benzonatate (TESSALON) 200 MG capsule, Take 1 capsule (200 mg total) by mouth 3 (three) times daily as needed for cough., Disp: 30 capsule, Rfl: 1 .  betamethasone dipropionate (DIPROLENE) 0.05 % cream, Apply 1 application topically daily., Disp: , Rfl:  .  Calcium Carb-Cholecalciferol (CALCIUM 500 +D PO), Take 1 tablet by mouth daily. , Disp: , Rfl:  .  chlorthalidone (HYGROTON) 25 MG tablet, Take 25 mg by mouth daily., Disp: , Rfl:  .  Cholecalciferol (VITAMIN D PO), Take 5,000 Units by mouth daily. , Disp: , Rfl:  .  cloNIDine (CATAPRES - DOSED IN MG/24 HR) 0.1 mg/24hr patch, Place 1 patch onto the skin every Sunday. , Disp: , Rfl:  .  losartan (COZAAR) 100 MG tablet, Take 100 mg by mouth daily., Disp: , Rfl:  .  Multiple Vitamin (MULTIVITAMIN WITH MINERALS) TABS tablet, Take 1 tablet by mouth daily., Disp: , Rfl:  .  Omega-3 Fatty Acids (FISH OIL) 1200 MG CAPS, Take 1,200 mg by mouth 2 (two) times daily. , Disp: , Rfl:  .  predniSONE (DELTASONE) 10 MG tablet, 4 tabs for 2 days, then 3 tabs for 2 days, 2 tabs for 2 days, then 1 tab for 2 days, then  stop, Disp: 20 tablet, Rfl: 0 .  spironolactone (ALDACTONE) 25 MG tablet, Take 25 mg by mouth daily., Disp: , Rfl:  .  verapamil (COVERA HS) 240 MG (CO) 24 hr tablet, Take 240 mg by mouth 2 (two) times daily. , Disp: , Rfl:  .  vitamin C (ASCORBIC ACID) 500 MG tablet, Take 500 mg by mouth daily., Disp: , Rfl:

## 2018-10-22 ENCOUNTER — Encounter: Payer: Self-pay | Admitting: Internal Medicine

## 2018-10-24 ENCOUNTER — Ambulatory Visit: Payer: Medicare Other | Admitting: Internal Medicine

## 2018-10-24 ENCOUNTER — Other Ambulatory Visit: Payer: Self-pay

## 2018-10-24 ENCOUNTER — Encounter: Payer: Self-pay | Admitting: Internal Medicine

## 2018-10-24 VITALS — BP 122/60 | HR 66 | Temp 98.1°F | Ht 62.0 in | Wt 208.4 lb

## 2018-10-24 DIAGNOSIS — G4733 Obstructive sleep apnea (adult) (pediatric): Secondary | ICD-10-CM

## 2018-10-24 DIAGNOSIS — R058 Other specified cough: Secondary | ICD-10-CM

## 2018-10-24 DIAGNOSIS — R05 Cough: Secondary | ICD-10-CM | POA: Diagnosis not present

## 2018-10-24 NOTE — Assessment & Plan Note (Signed)
Weight loss likely changed facial contours, contributing to leak. We may also be able to reduce pressure range. Plan- refit mask, change auto range to 5-15.

## 2018-10-24 NOTE — Patient Instructions (Signed)
Order- DME APS- please refit mask after weight loss to reduce leak. Change autopap range to 5-15, continue mask of choice, humidifier, supplies, AirView/ card  Suggest you try otc antihistamine Chlortrimeton at bedtime to see if it dries the mucus better. Take it at bedtime to reduce daytime sleepiness.

## 2018-10-24 NOTE — Assessment & Plan Note (Signed)
Weight down 59 lbs since NPSG, after bariatric surgery. She has slipped a little with Covid restriction of activity, but motivated to keep working at it.

## 2018-10-24 NOTE — Assessment & Plan Note (Signed)
Bothersome postnasal drip/ mucus in throat. Denies any sense of chest congestion. Plan- educated reflux precautions. Try greater drying effect of Chlortrimeton at HS.

## 2018-10-24 NOTE — Progress Notes (Signed)
HPI F never smoker seen for OSA. NPSG 05/23/12- AHI 42.6 per hour. 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  Husband does not tell her she snores.  She has been skipping CPAP, hoping she is no longer needs it. To see ENT about persistent hoarseness after intubation for surgery.  Otherwise breathing well and denies heart problems.  10/24/2018-  73 year old female never smoker followed for OSA, Insomnia, complicated by morbid obesity/ gastric sleeve bariatric surgery, HBP, Upper Airway Cough, GERD,  CPAP auto 10-20/APS    This machine < 78 year old. We sent prednisone and tessalon perles 08/14/18 -----OSA on CPAP 10-20, DME: APS, pt states she feels there may be a leakage in her mask Body weight today - 208 lbs almost 60 lbs less than original NPSG Mask has ben leaking/ poor seal for months.  Cough returned, feels like thick clear mucus from throat- no chest.Spirometry was WNL. Perles help some. No SOB. CXR 04/30/18-  IMPRESSION: No acute cardiopulmonary disease. Stable examination.  ROS-see HPI   + = positive Constitutional:   +   weight loss, night sweats, fevers, chills, fatigue, lassitude. HEENT:   No-  headaches, difficulty swallowing, tooth/dental problems, sore throat,       No-  sneezing, itching, ear ache, +nasal congestion, post nasal drip,  CV:  No-   chest pain, orthopnea, PND, swelling in lower extremities, anasarca, dizziness, palpitations Resp: +shortness of breath with exertion or at rest.       No-   productive cough,  No non-productive cough,  No- coughing up of blood.              No-   change in color of mucus.  No- wheezing.   Skin: No-   rash or lesions. GI:  No-   heartburn, indigestion, abdominal pain, nausea,  vomiting,  GU:  MS:  +  joint pain or swelling.   Neuro-     nothing unusual Psych:  No- change in mood or affect. No depression or anxiety.  No memory loss.  OBJ- Physical Exam General- Alert, Oriented, Affect-appropriate, Distress- none acute. + Obese still Skin- rash-none, lesions- none, excoriation- none Lymphadenopathy- none Head- atraumatic            Eyes- Gross vision intact, PERRLA, conjunctivae and secretions clear            Ears- Hearing, canals-normal            Nose- Clear, no-Septal dev, mucus, polyps, erosion, perforation             Throat- Mallampati II-III , mucosa clear , drainage- none, tonsils- atrophic,  Neck- flexible , trachea midline, no stridor , thyroid nl, carotid no bruit Chest - symmetrical excursion , unlabored           Heart/CV- RRR , no murmur , no gallop  , no rub, nl s1 s2                           - JVD- none , edema- none, stasis changes- none, varices- none           Lung- clear to P&A, wheeze- none, cough-none, dullness-none, rub- none           Chest wall-  Abd-  Br/ Gen/ Rectal-  Not done, not indicated Extrem- cyanosis- none, clubbing, none, atrophy- none, strength- nl Neuro- grossly intact to observation

## 2019-01-20 ENCOUNTER — Other Ambulatory Visit: Payer: Self-pay

## 2019-01-20 DIAGNOSIS — Z20822 Contact with and (suspected) exposure to covid-19: Secondary | ICD-10-CM

## 2019-01-22 LAB — NOVEL CORONAVIRUS, NAA: SARS-CoV-2, NAA: NOT DETECTED

## 2019-04-22 ENCOUNTER — Other Ambulatory Visit: Payer: Self-pay | Admitting: Family Medicine

## 2019-04-22 DIAGNOSIS — Z1231 Encounter for screening mammogram for malignant neoplasm of breast: Secondary | ICD-10-CM

## 2019-05-07 ENCOUNTER — Other Ambulatory Visit: Payer: Self-pay | Admitting: Cardiology

## 2019-05-07 ENCOUNTER — Ambulatory Visit: Payer: Self-pay | Admitting: *Deleted

## 2019-05-07 DIAGNOSIS — Z20822 Contact with and (suspected) exposure to covid-19: Secondary | ICD-10-CM

## 2019-05-07 NOTE — Telephone Encounter (Signed)
Summary: vaccine/testing   Patient has an appt to get vaccine Tuesday, patient daughter is getting tested today due having symptoms. Patient wasn't to know if this will effect anything.  Call back [phone]      Patient has possibly been exposed to COVID- can she get vaccine? Per CDC web site- as long as patient is asymptomatic and does not have acute illness she can get vaccine. Due to her possible exposure- if daughter is + COVID she should go into exposure protocol- which would quarantine her for 14 days and she should not be around others which would postpone her getting vaccine until after quarantine period. Patient advised to seek advisement from PCP as well.  Reason for Disposition . [1] Caller requesting NON-URGENT health information AND [2] PCP's office is the best resource  Protocols used: INFORMATION ONLY CALL-A-AH

## 2019-05-08 LAB — NOVEL CORONAVIRUS, NAA: SARS-CoV-2, NAA: NOT DETECTED

## 2019-05-10 ENCOUNTER — Telehealth: Payer: Self-pay | Admitting: Unknown Physician Specialty

## 2019-05-10 ENCOUNTER — Other Ambulatory Visit: Payer: Self-pay | Admitting: Unknown Physician Specialty

## 2019-05-10 DIAGNOSIS — I10 Essential (primary) hypertension: Secondary | ICD-10-CM

## 2019-05-10 DIAGNOSIS — U071 COVID-19: Secondary | ICD-10-CM

## 2019-05-10 NOTE — Telephone Encounter (Signed)
  I connected by phone with [name] on 05/10/2019 at 4:07 PM to discuss the potential use of an new treatment for mild to moderate COVID-19 viral infection in non-hospitalized patients.  This patient is a 74 y.o. female that meets the FDA criteria for Emergency Use Authorization of bamlanivimab or casirivimab\imdevimab.  Has a (+) direct SARS-CoV-2 viral test result  Has mild or moderate COVID-19   Is ? 74 years of age and weighs ? 40 kg  Is NOT hospitalized due to COVID-19  Is NOT requiring oxygen therapy or requiring an increase in baseline oxygen flow rate due to COVID-19  Is within 10 days of symptom onset  Has at least one of the high risk factor(s) for progression to severe COVID-19 and/or hospitalization as defined in EUA.  Specific high risk criteria : >/= 74 yo   I have spoken and communicated the following to the patient or parent/caregiver:  1. FDA has authorized the emergency use of bamlanivimab and casirivimab\imdevimab for the treatment of mild to moderate COVID-19 in adults and pediatric patients with positive results of direct SARS-CoV-2 viral testing who are 70 years of age and older weighing at least 40 kg, and who are at high risk for progressing to severe COVID-19 and/or hospitalization.  2. The significant known and potential risks and benefits of bamlanivimab and casirivimab\imdevimab, and the extent to which such potential risks and benefits are unknown.  3. Information on available alternative treatments and the risks and benefits of those alternatives, including clinical trials.  4. Patients treated with bamlanivimab and casirivimab\imdevimab should continue to self-isolate and use infection control measures (e.g., wear mask, isolate, social distance, avoid sharing personal items, clean and disinfect "high touch" surfaces, and frequent handwashing) according to CDC guidelines.   5. The patient or parent/caregiver has the option to accept or refuse  bamlanivimab or casirivimab\imdevimab .  After reviewing this information with the patient, The patient agreed to proceed with receiving the casirivimab\imdevimab infusion and will be provided a copy of the Fact sheet prior to receiving the infusion.Kathrine Haddock 05/10/2019 4:07 PM      Sx onset 1/23-muscle aches, cough

## 2019-05-12 ENCOUNTER — Ambulatory Visit: Payer: Medicare Other

## 2019-05-14 ENCOUNTER — Ambulatory Visit (HOSPITAL_COMMUNITY)
Admission: RE | Admit: 2019-05-14 | Discharge: 2019-05-14 | Disposition: A | Discharge status: Home / Self Care | Payer: Medicare Other | Source: Ambulatory Visit | Attending: Pulmonary Disease | Admitting: Pulmonary Disease

## 2019-05-14 DIAGNOSIS — U071 COVID-19: Secondary | ICD-10-CM | POA: Diagnosis present

## 2019-05-14 DIAGNOSIS — Z23 Encounter for immunization: Secondary | ICD-10-CM | POA: Diagnosis not present

## 2019-05-14 DIAGNOSIS — I10 Essential (primary) hypertension: Secondary | ICD-10-CM | POA: Insufficient documentation

## 2019-05-14 MED ORDER — FAMOTIDINE IN NACL 20-0.9 MG/50ML-% IV SOLN: 20.0000 mg | Freq: Once | INTRAVENOUS | Status: DC | PRN

## 2019-05-14 MED ORDER — DIPHENHYDRAMINE HCL 50 MG/ML IJ SOLN: 50.0000 mg | Freq: Once | INTRAMUSCULAR | Status: DC | PRN

## 2019-05-14 MED ORDER — SODIUM CHLORIDE 0.9 % IV SOLN
Freq: Once | INTRAVENOUS | Status: AC
  Filled 2019-05-14: qty 10

## 2019-05-14 MED ORDER — EPINEPHRINE 0.3 MG/0.3ML IJ SOAJ: 0.3000 mg | Freq: Once | INTRAMUSCULAR | Status: DC | PRN

## 2019-05-14 MED ORDER — METHYLPREDNISOLONE SODIUM SUCC 125 MG IJ SOLR: 125.0000 mg | Freq: Once | INTRAMUSCULAR | Status: DC | PRN

## 2019-05-14 MED ORDER — ALBUTEROL SULFATE HFA 108 (90 BASE) MCG/ACT IN AERS: 2.0000 | INHALATION_SPRAY | Freq: Once | RESPIRATORY_TRACT | Status: DC | PRN

## 2019-05-14 MED ORDER — SODIUM CHLORIDE 0.9 % IV SOLN
INTRAVENOUS | Status: DC | PRN
  Administered 2019-05-14: 250 mL via INTRAVENOUS

## 2019-05-14 NOTE — Progress Notes (Signed)
  Diagnosis: COVID-19  Physician:dr wright Procedure: [facility] Med: bamlanivimab infusion - Provided patient with bamlanimivab fact sheet for patients, parents and caregivers prior to infusion.  Complications: No immediate complications noted.  Discharge: Discharged home   [place] 05/14/2019

## 2019-05-14 NOTE — Discharge Instructions (Signed)
10 Things You Can Do to Manage Your COVID-19 Symptoms at Home If you have possible or confirmed COVID-19: 1. Stay home from work and school. And stay away from other public places. If you must go out, avoid using any kind of public transportation, ridesharing, or taxis. 2. Monitor your symptoms carefully. If your symptoms get worse, call your healthcare provider immediately. 3. Get rest and stay hydrated. 4. If you have a medical appointment, call the healthcare provider ahead of time and tell them that you have or may have COVID-19. 5. For medical emergencies, call 911 and notify the dispatch personnel that you have or may have COVID-19. 6. Cover your cough and sneezes with a tissue or use the inside of your elbow. 7. Wash your hands often with soap and water for at least 20 seconds or clean your hands with an alcohol-based hand sanitizer that contains at least 60% alcohol. 8. As much as possible, stay in a specific room and away from other people in your home. Also, you should use a separate bathroom, if available. If you need to be around other people in or outside of the home, wear a mask. 9. Avoid sharing personal items with other people in your household, like dishes, towels, and bedding. 10. Clean all surfaces that are touched often, like counters, tabletops, and doorknobs. Use household cleaning sprays or wipes according to the label instructions. [email or web address] 10/15/2018 This information is not intended to replace advice given to you by your health care provider. Make sure you discuss any questions you have with your health care provider. Document Revised: 03/19/2019 Document Reviewed: 03/19/2019 Elsevier Patient Education  [place]. What types of side effects do monoclonal antibody drugs cause?  Common side effects  In general, the more common side effects caused by monoclonal antibody drugs include: . Allergic reactions, such as hives or itching . Flu-like signs and  symptoms, including chills, fatigue, fever, and muscle aches and pains . Nausea, vomiting . Diarrhea . Skin rashes . Low blood pressure   The CDC is recommending patients who receive monoclonal antibody treatments wait at least 90 days before being vaccinated.  Currently, there are no data on the safety and efficacy of mRNA COVID-19 vaccines in persons who received monoclonal antibodies or convalescent plasma as part of COVID-19 treatment. Based on the estimated half-life of such therapies as well as evidence suggesting that reinfection is uncommon in the 90 days after initial infection, vaccination should be deferred for at least 90 days, as a precautionary measure until additional information becomes available, to avoid interference of the antibody treatment with vaccine-induced immune responses. What types of side effects do monoclonal antibody drugs cause?  Common side effects  In general, the more common side effects caused by monoclonal antibody drugs include: . Allergic reactions, such as hives or itching . Flu-like signs and symptoms, including chills, fatigue, fever, and muscle aches and pains . Nausea, vomiting . Diarrhea . Skin rashes . Low blood pressure   The CDC is recommending patients who receive monoclonal antibody treatments wait at least 90 days before being vaccinated.  Currently, there are no data on the safety and efficacy of mRNA COVID-19 vaccines in persons who received monoclonal antibodies or convalescent plasma as part of COVID-19 treatment. Based on the estimated half-life of such therapies as well as evidence suggesting that reinfection is uncommon in the 90 days after initial infection, vaccination should be deferred for at least 90 days, as a precautionary measure until additional information  additional information becomes available, to avoid interference of the antibody treatment with vaccine-induced immune responses. 

## 2019-05-21 ENCOUNTER — Ambulatory Visit: Payer: Medicare Other

## 2019-06-01 ENCOUNTER — Other Ambulatory Visit: Payer: Self-pay

## 2019-06-01 ENCOUNTER — Ambulatory Visit
Admission: RE | Admit: 2019-06-01 | Discharge: 2019-06-01 | Disposition: A | Discharge status: Home / Self Care | Payer: Medicare PPO | Source: Ambulatory Visit | Attending: Family Medicine | Admitting: Family Medicine

## 2019-06-01 DIAGNOSIS — Z1231 Encounter for screening mammogram for malignant neoplasm of breast: Secondary | ICD-10-CM

## 2019-06-25 ENCOUNTER — Ambulatory Visit: Payer: Medicare PPO | Admitting: Neurology

## 2019-06-25 ENCOUNTER — Other Ambulatory Visit: Payer: Self-pay

## 2019-06-25 ENCOUNTER — Encounter: Payer: Self-pay | Admitting: Neurology

## 2019-06-25 VITALS — BP 125/71 | HR 68 | Temp 97.4°F | Ht 62.0 in | Wt 217.0 lb

## 2019-06-25 DIAGNOSIS — R519 Headache, unspecified: Secondary | ICD-10-CM

## 2019-06-25 DIAGNOSIS — G441 Vascular headache, not elsewhere classified: Secondary | ICD-10-CM

## 2019-06-25 DIAGNOSIS — G4485 Primary stabbing headache: Secondary | ICD-10-CM

## 2019-06-25 DIAGNOSIS — H539 Unspecified visual disturbance: Secondary | ICD-10-CM

## 2019-06-25 MED ORDER — METHYLPREDNISOLONE 4 MG PO TBPK: ORAL_TABLET | ORAL | 1 refills | Status: DC

## 2019-06-25 NOTE — Patient Instructions (Signed)
MRI of the brain Blood work Steroid taper  Methylprednisolone tablets What is this medicine? METHYLPREDNISOLONE (meth ill pred NISS oh lone) is a corticosteroid. It is commonly used to treat inflammation of the skin, joints, lungs, and other organs. Common conditions treated include asthma, allergies, and arthritis. It is also used for other conditions, such as blood disorders and diseases of the adrenal glands. This medicine may be used for other purposes; ask your health care provider or pharmacist if you have questions. COMMON BRAND NAME(S): Medrol, Medrol Dosepak What should I tell my health care provider before I take this medicine? They need to know if you have any of these conditions:  Cushing's syndrome  eye disease, vision problems  diabetes  glaucoma  heart disease  high blood pressure  infection (especially a virus infection such as chickenpox, cold sores, or herpes)  liver disease  mental illness  myasthenia gravis  osteoporosis  recently received or scheduled to receive a vaccine  seizures  stomach or intestine problems  thyroid disease  an unusual or allergic reaction to lactose, methylprednisolone, other medicines, foods, dyes, or preservatives  pregnant or trying to get pregnant  breast-feeding How should I use this medicine? Take this medicine by mouth with a glass of water. Follow the directions on the prescription label. Take this medicine with food. If you are taking this medicine once a day, take it in the morning. Do not take it more often than directed. Do not suddenly stop taking your medicine because you may develop a severe reaction. Your doctor will tell you how much medicine to take. If your doctor wants you to stop the medicine, the dose may be slowly lowered over time to avoid any side effects. Talk to your pediatrician regarding the use of this medicine in children. Special care may be needed. Overdosage: If you think you have taken too  much of this medicine contact a poison control center or emergency room at once. NOTE: This medicine is only for you. Do not share this medicine with others. What if I miss a dose? If you miss a dose, take it as soon as you can. If it is almost time for your next dose, talk to your doctor or health care professional. You may need to miss a dose or take an extra dose. Do not take double or extra doses without advice. What may interact with this medicine? Do not take this medicine with any of the following medications:  alefacept  echinacea  live virus vaccines  metyrapone  mifepristone This medicine may also interact with the following medications:  amphotericin B  aspirin and aspirin-like medicines  certain antibiotics like erythromycin, clarithromycin, troleandomycin  certain medicines for diabetes  certain medicines for fungal infections like ketoconazole  certain medicines for seizures like carbamazepine, phenobarbital, phenytoin  certain medicines that treat or prevent blood clots like warfarin  cholestyramine  cyclosporine  digoxin  diuretics  female hormones, like estrogens and birth control pills  isoniazid  NSAIDs, medicines for pain inflammation, like ibuprofen or naproxen  other medicines for myasthenia gravis  rifampin  vaccines This list may not describe all possible interactions. Give your health care provider a list of all the medicines, herbs, non-prescription drugs, or dietary supplements you use. Also tell them if you smoke, drink alcohol, or use illegal drugs. Some items may interact with your medicine. What should I watch for while using this medicine? Tell your doctor or healthcare professional if your symptoms do not start to get  better or if they get worse. Do not stop taking except on your doctor's advice. You may develop a severe reaction. Your doctor will tell you how much medicine to take. This medicine may increase your risk of getting  an infection. Tell your doctor or health care professional if you are around anyone with measles or chickenpox, or if you develop sores or blisters that do not heal properly. This medicine may increase blood sugar levels. Ask your healthcare provider if changes in diet or medicines are needed if you have diabetes. Tell your doctor or health care professional right away if you have any change in your eyesight. Using this medicine for a long time may increase your risk of low bone mass. Talk to your doctor about bone health. What side effects may I notice from receiving this medicine? Side effects that you should report to your doctor or health care professional as soon as possible:  allergic reactions like skin rash, itching or hives, swelling of the face, lips, or tongue  bloody or tarry stools  hallucination, loss of contact with reality  muscle cramps  muscle pain  palpitations  signs and symptoms of high blood sugar such as being more thirsty or hungry or having to urinate more than normal. You may also feel very tired or have blurry vision.  signs and symptoms of infection like fever or chills; cough; sore throat; pain or trouble passing urine Side effects that usually do not require medical attention (report to your doctor or health care professional if they continue or are bothersome):  changes in emotions or mood  constipation  diarrhea  excessive hair growth on the face or body  headache  nausea, vomiting  trouble sleeping  weight gain This list may not describe all possible side effects. Call your doctor for medical advice about side effects. You may report side effects to FDA at 1-800-FDA-1088. Where should I keep my medicine? Keep out of the reach of children. Store at room temperature between 20 and 25 degrees C (68 and 77 degrees F). Throw away any unused medicine after the expiration date. NOTE: This sheet is a summary. It may not cover all possible  information. If you have questions about this medicine, talk to your doctor, pharmacist, or health care provider.  2020 Elsevier/[employer] Standard (2018-01-02 09:19:36)

## 2019-06-25 NOTE — Progress Notes (Signed)
[id number]    Provider:  Dr Jaynee Eagles Requesting Provider: Leighton Ruff, MD Primary Care Provider:  Leighton Ruff, MD  CC:  headaches  HPI:  Beth Sullivan is a 74 y.o. female here as requested by Leighton Ruff, MD for ice-pick headache. PMHx sleep apnea, prediabetes, morbid obesity, hypertension, hyperlipidemia, depression, chronic kidney disease, chronic pain disorder, chronic headaches.  I reviewed Dr. Quay Burow notes: It appears patient had Covid, complaining of sharp pains in head, cough, chills, low-grade fever, muscle aches, slight headaches, sharp pains around the temples for 2 days this was early January.  She was diagnosed with COVID-19 on January 24, when she was last seen on May 25, 2019 she was feeling much better but still has some residual headache.  Labs include hemoglobin A1c 5.6 November 21, 2018, CBC unremarkable November 21, 2018.  She had covid the beginning of January, new headaches, like a sinus headache, throbs behind the eyes and behind the ears, just a few seconds, 1-2 a day, it can happen anytime, not getting better, stable, it is a little nagging not really painful. She has had ice-pick headaches for years and years, worsened with covid, she has them all over but during covid she had them on the left side of the head.stabbing and down the arm, lasted 4 days, no light or sound sensitivity, no autonomic symptoms, she can have stabbing behind both eyes not at the same time it can switch sides. 3-4x a year. Doesn't last long a few seconds it can be severe. The headaches usually don' last long, they are brief and go away. She reports vision changes, she has episodes of not seeing well. No weakness, no numbness, no tingling. She has some temple pain on both sides.   Reviewed notes, labs and imaging from outside physicians, which showed: see above  Review of Systems: Patient complains of symptoms per HPI as well as the following symptoms: Eye pain,  snoring, easy bruising, feeling cold, joint pain, cramps, aching muscles, headache, snoring. Pertinent negatives and positives per HPI. All others negative.   Social History   Socioeconomic History  . Marital status: Married    Spouse name: Not on file  . Number of children: 1  . Years of education: 69  . Highest education level: Not on file  Occupational History  . Occupation: retired    Comment: Recruitment consultant for [employer]  . Smoking status: Never Smoker  . Smokeless tobacco: Never Used  Substance and Sexual Activity  . Alcohol use: No  . Drug use: No  . Sexual activity: Not on file  Other Topics Concern  . Not on file  Social History Narrative   She remarried for 50 years. They have 1 child and 2 grandchildren. She lives with her husband.   She is trying to exercise doing water exercises because it is something she can do with chronic neck/back pain.      Right handed   Caffeine: decaf coffee, 2 cups/day   Social Determinants of Health   Financial Resource Strain:   . Difficulty of Paying Living Expenses:   Food Insecurity:   . Worried About Charity fundraiser in the Last Year:   . Arboriculturist in the Last Year:   Transportation Needs:   . Film/video editor (Medical):   Marland Kitchen Lack of Transportation (Non-Medical):   Physical Activity:   . Days of Exercise per Week:   . Minutes of Exercise per  Session:   Stress:   . Feeling of Stress :   Social Connections:   . Frequency of Communication with Friends and Family:   . Frequency of Social Gatherings with Friends and Family:   . Attends Religious Services:   . Active Member of Clubs or Organizations:   . Attends Archivist Meetings:   Marland Kitchen Marital Status:   Intimate Partner Violence:   . Fear of Current or Ex-Partner:   . Emotionally Abused:   Marland Kitchen Physically Abused:   . Sexually Abused:     Family History  Problem Relation Age of Onset  . Heart disease Mother        Unaware  of specifics  . Hypertension Mother   . Diabetes Mother   . Heart attack Mother   . Diabetes Father   . Hypertension Father   . Prostate cancer Brother   . Prostate cancer Brother   . Hypertension Sister   . Heart disease Sister        Unaware of specifics  . Heart disease Brother        Our specifics  . Colon cancer Maternal Grandmother   . Migraines Brother   . Breast cancer Neg Hx     Past Medical History:  Diagnosis Date  . Allergic rhinitis   . Chronic headaches   . Chronic pain disorder    From long-standing cervical spine disease - chronic pain management, history of  . CKD (chronic kidney disease), stage II    Per PCP notes - Cr 0.9-1.3  . Depression   . GERD (gastroesophageal reflux disease)   . Hair loss   . History of colon polyps 02/2014   Dr. Lizbeth Bark  . Hyperlipemia   . Hypertension   . Morbid obesity with BMI of 45.0-49.9, adult ([place])   . Pre-diabetes   . Sleep apnea    uses a cpap - Dr. Annamaria Boots  . Vitamin D deficiency     Patient Active Problem List   Diagnosis Date Noted  . Upper airway cough syndrome 04/30/2018  . Morbid obesity due to excess calories ([place]) 10/23/2017  . 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 Essential hypertension 08/26/2016  . Dyspnea on exertion 08/24/2016  . Atypical chest pain 08/24/2016  . Rapid heartbeat 08/24/2016  . Insomnia 05/13/2016  . Acute bronchitis 09/21/2012  . Obstructive sleep apnea 05/29/2012    Past Surgical History:  Procedure Laterality Date  . ABDOMINAL HYSTERECTOMY  1975   BSO  . BREAST EXCISIONAL BIOPSY Left 2015   papilloma  . BREAST LUMPECTOMY WITH NEEDLE LOCALIZATION Left 05/12/2013   Procedure: BREAST LUMPECTOMY WITH NEEDLE LOCALIZATION;  Surgeon: Marcello Moores A. Cornett, MD;  Location: [facility];  Service: General;  Laterality: Left;  . CESAREAN SECTION  1970  . COLONOSCOPY    . EYE SURGERY  2008   both cataracts  . FOOT SURGERY  08-2011   Left  . HAND  SURGERY Right   . LAPAROSCOPIC GASTRIC SLEEVE RESECTION N/A 04/30/2017   Procedure: LAPAROSCOPIC GASTRIC SLEEVE RESECTION WITH UPPER ENDO;  Surgeon: Johnathan Hausen, MD;  Location: [facility];  Service: General;  Laterality: N/A;  . right CTS    . TUBAL LIGATION    . VESICOVAGINAL FISTULA CLOSURE W/ TAH    . vocal cord surgery  07/2017    Current Outpatient Medications  Medication Sig Dispense Refill  . aspirin EC 81 MG tablet Take 81 mg by mouth at bedtime.    Marland Kitchen  atorvastatin (LIPITOR) 10 MG tablet Take 10 mg by mouth at bedtime.     . betamethasone dipropionate (DIPROLENE) 0.05 % cream Apply 1 application topically. 2-3 times weekly.    . Calcium Carbonate (CALCIUM 500 PO) Take 500 mg by mouth every other day.    . chlorthalidone (HYGROTON) 25 MG tablet Take 25 mg by mouth daily.    . Cholecalciferol (VITAMIN D3 PO) Take 5,000 Int'l Units by mouth every other day.    . cloNIDine (CATAPRES - DOSED IN MG/24 HR) 0.1 mg/24hr patch Place 1 patch onto the skin every Sunday.     . finasteride (PROSCAR) 5 MG tablet Take 2.5 mg by mouth daily.    Marland Kitchen losartan (COZAAR) 25 MG tablet Take 25 mg by mouth daily.    . Multiple Vitamin (MULTIVITAMIN WITH MINERALS) TABS tablet Take 1 tablet by mouth daily.    Marland Kitchen spironolactone (ALDACTONE) 25 MG tablet Take 25 mg by mouth daily.    . verapamil (COVERA HS) 240 MG (CO) 24 hr tablet Take 240 mg by mouth 2 (two) times daily.     . vitamin C (ASCORBIC ACID) 500 MG tablet Take 500 mg by mouth daily.    . methylPREDNISolone (MEDROL DOSEPAK) 4 MG TBPK tablet Take all pills in the morning with food for 6 days 21 tablet 1   No current facility-administered medications for this visit.    Allergies as of 06/25/2019 - Review Complete 06/25/2019  Allergen Reaction Noted  . Tramadol Nausea And Vomiting 06/28/2016    Vitals: BP 125/71 (BP Location: Left Arm, Patient Position: Sitting)   Pulse 68   Temp (!) 97.4 F (36.3 C) Comment: taken at front  Ht 5\' 2"  (1.575 m)    Wt 217 lb (98.4 kg)   BMI 39.69 kg/m  Last Weight:  Wt Readings from Last 1 Encounters:  06/25/19 217 lb (98.4 kg)   Last Height:   Ht Readings from Last 1 Encounters:  06/25/19 5\' 2"  (1.575 m)     Physical exam: Exam: Gen: NAD, conversant, well nourised, obese, well groomed                     CV: RRR, no MRG. No Carotid Bruits. No peripheral edema, warm, nontender Eyes: Conjunctivae clear without exudates or hemorrhage  Neuro: Detailed Neurologic Exam  Speech:    Speech is normal; fluent and spontaneous with normal comprehension.  Cognition:    The patient is oriented to person, place, and time;     recent and remote memory intact;     language fluent;     normal attention, concentration,     fund of knowledge Cranial Nerves:    The pupils are equal, round, and reactive to light. Could not visualize fundi, pupils small. . Visual fields are full to finger confrontation. Extraocular movements are intact. Trigeminal sensation is intact and the muscles of mastication are normal. The face is symmetric. The palate elevates in the midline. Hearing intact. Voice is normal. Shoulder shrug is normal. The tongue has normal motion without fasciculations.   Coordination:    No dysmetria or ataxia  Gait:    Normal native gait  Motor Observation:    No asymmetry, no atrophy, and no involuntary movements noted. Tone:    Normal muscle tone.    Posture:    Posture is normal. normal erect    Strength:    Strength is V/V in the upper and lower limbs.  Sensation: intact to LT     Reflex Exam:  DTR's:    Deep tendon reflexes in the upper and lower extremities are normal bilaterally.   Toes:    The toes are downgoing bilaterally.   Clonus:    Clonus is absent.    Assessment/Plan:  Really lovely female here with residual headaches after covid. She also has chronic headaches, worsening, will check MRI of the brain for space-occupying mass or any lesion that could be  causing her headaches. The headaches are brief so would be difficult to treat acutely, will hold off on anything acute, will give a medrol dosepak. Neuro exam normal. No autonomic symptoms with the headaches. Gave her information on primary stabbing headache from the american migraine society.   Orders Placed This Encounter  Procedures  . MR BRAIN W WO CONTRAST  . Basic Metabolic Panel  . Sedimentation rate  . C-reactive protein   Meds ordered this encounter  Medications  . methylPREDNISolone (MEDROL DOSEPAK) 4 MG TBPK tablet    Sig: Take all pills in the morning with food for 6 days    Dispense:  21 tablet    Refill:  1   Discussed: To prevent or relieve headaches, try the following: Cool Compress. Lie down and place a cool compress on your head.  Avoid headache triggers. If certain foods or odors seem to have triggered your migraines in the past, avoid them. A headache diary might help you identify triggers.  Include physical activity in your daily routine. Try a daily walk or other moderate aerobic exercise.  Manage stress. Find healthy ways to cope with the stressors, such as delegating tasks on your to-do list.  Practice relaxation techniques. Try deep breathing, yoga, massage and visualization.  Eat regularly. Eating regularly scheduled meals and maintaining a healthy diet might help prevent headaches. Also, drink plenty of fluids.  Follow a regular sleep schedule. Sleep deprivation might contribute to headaches Consider biofeedback. With this mind-body technique, you learn to control certain bodily functions -- such as muscle tension, heart rate and blood pressure -- to prevent headaches or reduce headache pain.    Proceed to emergency room if you experience new or worsening symptoms or symptoms do not resolve, if you have new neurologic symptoms or if headache is severe, or for any concerning symptom.   Provided education and documentation from American Migraine Society toolbox  including articles on: primary stabbing headache.  Cc: Leighton Ruff, MD,    Sarina Ill, MD  [facility] [address] [place], [place] [address]  Phone [phone] Fax [phone]

## 2019-06-26 DIAGNOSIS — Z8371 Family history of colonic polyps: Secondary | ICD-10-CM | POA: Diagnosis not present

## 2019-06-26 DIAGNOSIS — Z86010 Personal history of colonic polyps: Secondary | ICD-10-CM | POA: Diagnosis not present

## 2019-06-26 DIAGNOSIS — Z9884 Bariatric surgery status: Secondary | ICD-10-CM | POA: Diagnosis not present

## 2019-06-26 LAB — BASIC METABOLIC PANEL
BUN/Creatinine Ratio: 28 (ref 12–28)
BUN: 32 mg/dL — ABNORMAL HIGH (ref 8–27)
CO2: 24 mmol/L (ref 20–29)
Calcium: 9.9 mg/dL (ref 8.7–10.3)
Chloride: 102 mmol/L (ref 96–106)
Creatinine, Ser: 1.15 mg/dL — ABNORMAL HIGH (ref 0.57–1.00)
GFR calc Af Amer: 55 mL/min/{1.73_m2} — ABNORMAL LOW (ref >59)
GFR calc non Af Amer: 48 mL/min/{1.73_m2} — ABNORMAL LOW (ref >59)
Glucose: 81 mg/dL (ref 65–99)
Potassium: 4.7 mmol/L (ref 3.5–5.2)
Sodium: 140 mmol/L (ref 134–144)

## 2019-06-26 LAB — C-REACTIVE PROTEIN: CRP: 3 mg/L (ref 0–10)

## 2019-06-26 LAB — SEDIMENTATION RATE: Sed Rate: 21 mm/hr (ref 0–40)

## 2019-07-01 ENCOUNTER — Telehealth: Payer: Self-pay | Admitting: Neurology

## 2019-07-01 NOTE — Telephone Encounter (Signed)
[employer] pending faxed notes.  

## 2019-07-01 NOTE — Telephone Encounter (Signed)
Mcarthur Rossetti Josem Kaufmann: [id number] (exp. 07/01/19 to 07/31/19) order sent to GI. They will reach out to the patient to schedule.

## 2019-07-20 DIAGNOSIS — J40 Bronchitis, not specified as acute or chronic: Secondary | ICD-10-CM | POA: Diagnosis not present

## 2019-07-20 DIAGNOSIS — J302 Other seasonal allergic rhinitis: Secondary | ICD-10-CM | POA: Diagnosis not present

## 2019-07-27 DIAGNOSIS — Z1159 Encounter for screening for other viral diseases: Secondary | ICD-10-CM | POA: Diagnosis not present

## 2019-07-30 DIAGNOSIS — Z8371 Family history of colonic polyps: Secondary | ICD-10-CM | POA: Diagnosis not present

## 2019-07-30 DIAGNOSIS — K5730 Diverticulosis of large intestine without perforation or abscess without bleeding: Secondary | ICD-10-CM | POA: Diagnosis not present

## 2019-08-01 ENCOUNTER — Ambulatory Visit
Admission: RE | Admit: 2019-08-01 | Discharge: 2019-08-01 | Disposition: A | Discharge status: Home / Self Care | Payer: Medicare PPO | Source: Ambulatory Visit | Attending: Neurology | Admitting: Neurology

## 2019-08-01 DIAGNOSIS — R32 Unspecified urinary incontinence: Secondary | ICD-10-CM | POA: Diagnosis not present

## 2019-08-01 DIAGNOSIS — Z6839 Body mass index (BMI) 39.0-39.9, adult: Secondary | ICD-10-CM | POA: Diagnosis not present

## 2019-08-01 DIAGNOSIS — G441 Vascular headache, not elsewhere classified: Secondary | ICD-10-CM | POA: Diagnosis not present

## 2019-08-01 DIAGNOSIS — E785 Hyperlipidemia, unspecified: Secondary | ICD-10-CM | POA: Diagnosis not present

## 2019-08-01 DIAGNOSIS — L659 Nonscarring hair loss, unspecified: Secondary | ICD-10-CM | POA: Diagnosis not present

## 2019-08-01 DIAGNOSIS — Z833 Family history of diabetes mellitus: Secondary | ICD-10-CM | POA: Diagnosis not present

## 2019-08-01 DIAGNOSIS — K5900 Constipation, unspecified: Secondary | ICD-10-CM | POA: Diagnosis not present

## 2019-08-01 DIAGNOSIS — K219 Gastro-esophageal reflux disease without esophagitis: Secondary | ICD-10-CM | POA: Diagnosis not present

## 2019-08-01 DIAGNOSIS — M1990 Unspecified osteoarthritis, unspecified site: Secondary | ICD-10-CM | POA: Diagnosis not present

## 2019-08-01 DIAGNOSIS — I10 Essential (primary) hypertension: Secondary | ICD-10-CM | POA: Diagnosis not present

## 2019-08-01 MED ORDER — GADOBENATE DIMEGLUMINE 529 MG/ML IV SOLN
20.0000 mL | Freq: Once | INTRAVENOUS | Status: AC | PRN
  Administered 2019-08-01: 20 mL via INTRAVENOUS

## 2019-08-03 ENCOUNTER — Telehealth: Payer: Self-pay | Admitting: *Deleted

## 2019-08-03 DIAGNOSIS — I10 Essential (primary) hypertension: Secondary | ICD-10-CM | POA: Diagnosis not present

## 2019-08-03 DIAGNOSIS — J019 Acute sinusitis, unspecified: Secondary | ICD-10-CM | POA: Diagnosis not present

## 2019-08-03 NOTE — Telephone Encounter (Signed)
I spoke with the pt and discussed per Dr. Jaynee Eagles that her MRI brain showed evidence of acute sinus infection as well as chronic issues with sinus however brain was normal for age. I let pt know that she should contact Dr. Drema Dallas for treatment of the sinus infection and I asked for a call back after she is treated to see if headaches are better. Pt aware we have faxed the results to Dr. Drema Dallas office. Her questions were answered. She will stay in touch as she has been keeping a log of her headaches. She verbalized appreciation for the call.

## 2019-08-03 NOTE — Telephone Encounter (Signed)
-----   Message from Melvenia Beam, MD sent at 08/02/2019  6:19 PM EDT ----- Regarding: Your patient has acute maxillary sinusitis, requesting your help to treat Dr. Drema Dallas, You sent patient to me for headaches and she stated she felt like she had a sinus infoection - Patient does have an acute left maxillary sinus infection which is what she said the headaches feel like. She also has widespread chronic disease. Forwarding MRi results to see if you want treat patient for the maxillary acute sinusitis and/or send her to ENT. Please let patient know, sorry I am out of practice of treating sinus infections and wouldn't know what was best to give her. Otherwise the brain is normal for age 74. Thanks Romelle Starcher, fax this to Dr. Drema Dallas' office too, I am not sure she gets these Epic notes for some reason)  ----- Message ----- From: Britt Bottom, MD Sent: 08/02/2019  12:31 PM EDT To: Melvenia Beam, MD

## 2019-08-11 ENCOUNTER — Telehealth: Payer: Self-pay | Admitting: Internal Medicine

## 2019-08-11 DIAGNOSIS — G4733 Obstructive sleep apnea (adult) (pediatric): Secondary | ICD-10-CM

## 2019-08-11 NOTE — Telephone Encounter (Signed)
Spoke with the pt  She is asking if we can increase her CPAP back to 10  She feels like she is not getting enough air and not sleeping well  I printed a DL and placed in C pod next to where Dr Annamaria Boots sits to review  Please advise, thanks!

## 2019-08-12 NOTE — Telephone Encounter (Signed)
Spoke with the pt and notified of response per Dr Annamaria Boots  She verbalized understanding  Order sent to [facility]

## 2019-08-12 NOTE — Telephone Encounter (Signed)
Ok- please order DME change autopap to 10-15

## 2019-09-15 DIAGNOSIS — Z1159 Encounter for screening for other viral diseases: Secondary | ICD-10-CM | POA: Diagnosis not present

## 2019-09-15 DIAGNOSIS — R7303 Prediabetes: Secondary | ICD-10-CM | POA: Diagnosis not present

## 2019-09-15 DIAGNOSIS — R7989 Other specified abnormal findings of blood chemistry: Secondary | ICD-10-CM | POA: Diagnosis not present

## 2019-09-25 ENCOUNTER — Telehealth: Payer: Self-pay | Admitting: Hematology and Oncology

## 2019-09-25 NOTE — Telephone Encounter (Signed)
Received a new hem referral from Dr. Drema Dallas for abnl cbc. Beth Sullivan has been cld and scheduled to see Dr. Alvy Bimler on 6/21 at 1:15pm. Pt aware to arrive 15 minutes early.

## 2019-10-02 DIAGNOSIS — R7989 Other specified abnormal findings of blood chemistry: Secondary | ICD-10-CM | POA: Insufficient documentation

## 2019-10-02 NOTE — Progress Notes (Signed)
Beth Sullivan NOTE  Patient Care Team: Leighton Ruff, MD as PCP - General (Family Medicine)  CHIEF COMPLAINTS/PURPOSE OF CONSULTATION:  Abnormal CBC  HISTORY OF PRESENTING ILLNESS:  Beth Sullivan 74 y.o. female is here because of  Abnormal WBC.  She was found to have abnormal CBC from blood work performed at her primary care doctor's office On September 15, 2019, CBC showed a white count of 3.9, hemoglobin 12.8 with a normal platelet count of 280.  She has 50.9% lymphocytes, giving total absolute neutrophil count of 1.3 Prior to that, on June 12, 2019, white blood cell count was 4.3, hemoglobin 12.1 and platelet count was normal In January 2020, her white blood cell count was 5, hemoglobin 12.7 and platelet count was normal Her historical labs from 2019 show high WBC She complained of sinus congestion intermittently since March She was prescribed a course of antibiotics along with Medrol pack She denies fever or chills No recent travel or pets at home  There is not reported symptoms of urinary frequency/urgency or dysuria, diarrhea, joint swelling/pain or abnormal skin rash.  She complained of muscle pain in the upper arm and on her right hip but was not as diagnosis of any form of rheumatological disorder She had no prior history or diagnosis of cancer. Her age appropriate screening programs are up-to-date. Most importantly, in terms of her history relevant to her presenting illness, is her history of weight loss surgery with sleeve gastrectomy She has not been seen by dietitian or general surgery for some time The patient have initially lost a lot of weight from 287 pounds down to about 189 pounds but over the years, has slowly gained the weight back  MEDICAL HISTORY:  Past Medical History:  Diagnosis Date  . Allergic rhinitis   . Chronic headaches   . Chronic pain disorder    From long-standing cervical spine disease - chronic pain management, history of   . CKD (chronic kidney disease), stage II    Per PCP notes - Cr 0.9-1.3  . Depression   . GERD (gastroesophageal reflux disease)   . Hair loss   . History of colon polyps 02/2014   Dr. Lizbeth Bark  . Hyperlipemia   . Hypertension   . Morbid obesity with BMI of 45.0-49.9, adult ([place])   . Pre-diabetes   . Sleep apnea    uses a cpap - Dr. Annamaria Boots  . Vitamin D deficiency     SURGICAL HISTORY: Past Surgical History:  Procedure Laterality Date  . ABDOMINAL HYSTERECTOMY  1975   BSO  . BREAST EXCISIONAL BIOPSY Left 2015   papilloma  . BREAST LUMPECTOMY WITH NEEDLE LOCALIZATION Left 05/12/2013   Procedure: BREAST LUMPECTOMY WITH NEEDLE LOCALIZATION;  Surgeon: Marcello Moores A. Cornett, MD;  Location: [place];  Service: General;  Laterality: Left;  . CESAREAN SECTION  1970  . COLONOSCOPY    . EYE SURGERY  2008   both cataracts  . FOOT SURGERY  08-2011   Left  . HAND SURGERY Right   . LAPAROSCOPIC GASTRIC SLEEVE RESECTION N/A 04/30/2017   Procedure: LAPAROSCOPIC GASTRIC SLEEVE RESECTION WITH UPPER ENDO;  Surgeon: Johnathan Hausen, MD;  Location: [facility];  Service: General;  Laterality: N/A;  . right CTS    . TUBAL LIGATION    . VESICOVAGINAL FISTULA CLOSURE W/ TAH    . vocal cord surgery  07/2017    SOCIAL HISTORY: Social History   Socioeconomic History  . Marital status: Married  Spouse name: Not on file  . Number of children: 1  . Years of education: 52  . Highest education level: Not on file  Occupational History  . Occupation: retired    Comment: Recruitment consultant for [employer]  . Smoking status: Never Smoker  . Smokeless tobacco: Never Used  Vaping Use  . Vaping Use: Never used  Substance and Sexual Activity  . Alcohol use: No  . Drug use: No  . Sexual activity: Not on file  Other Topics Concern  . Not on file  Social History Narrative   She remarried for 50 years. They have 1 child and 2 grandchildren. She lives with her husband.    She is trying to exercise doing water exercises because it is something she can do with chronic neck/back pain.      Right handed   Caffeine: decaf coffee, 2 cups/day   Social Determinants of Health   Financial Resource Strain:   . Difficulty of Paying Living Expenses:   Food Insecurity:   . Worried About Charity fundraiser in the Last Year:   . Arboriculturist in the Last Year:   Transportation Needs:   . Film/video editor (Medical):   Marland Kitchen Lack of Transportation (Non-Medical):   Physical Activity:   . Days of Exercise per Week:   . Minutes of Exercise per Session:   Stress:   . Feeling of Stress :   Social Connections:   . Frequency of Communication with Friends and Family:   . Frequency of Social Gatherings with Friends and Family:   . Attends Religious Services:   . Active Member of Clubs or Organizations:   . Attends Archivist Meetings:   Marland Kitchen Marital Status:   Intimate Partner Violence:   . Fear of Current or Ex-Partner:   . Emotionally Abused:   Marland Kitchen Physically Abused:   . Sexually Abused:     FAMILY HISTORY: Family History  Problem Relation Age of Onset  . Heart disease Mother        Unaware of specifics  . Hypertension Mother   . Diabetes Mother   . Heart attack Mother   . Diabetes Father   . Hypertension Father   . Prostate cancer Brother   . Prostate cancer Brother   . Hypertension Sister   . Heart disease Sister        Unaware of specifics  . Heart disease Brother        Our specifics  . Colon cancer Maternal Grandmother   . Migraines Brother   . Breast cancer Neg Hx     ALLERGIES:  is allergic to tramadol.  MEDICATIONS:  Current Outpatient Medications  Medication Sig Dispense Refill  . aspirin EC 81 MG tablet Take 81 mg by mouth at bedtime.    Marland Kitchen atorvastatin (LIPITOR) 10 MG tablet Take 10 mg by mouth at bedtime.     . betamethasone dipropionate (DIPROLENE) 0.05 % cream Apply 1 application topically. 2-3 times weekly.    . Calcium  Carbonate (CALCIUM 500 PO) Take 500 mg by mouth every other day.    . chlorthalidone (HYGROTON) 25 MG tablet Take 25 mg by mouth daily.    . Cholecalciferol (VITAMIN D3 PO) Take 5,000 Int'l Units by mouth every other day.    . cloNIDine (CATAPRES - DOSED IN MG/24 HR) 0.1 mg/24hr patch Place 1 patch onto the skin every Sunday.     . finasteride (PROSCAR) 5  MG tablet Take 2.5 mg by mouth daily.    Marland Kitchen losartan (COZAAR) 25 MG tablet Take 25 mg by mouth daily.    . Multiple Vitamin (MULTIVITAMIN WITH MINERALS) TABS tablet Take 1 tablet by mouth daily.    Marland Kitchen spironolactone (ALDACTONE) 25 MG tablet Take 25 mg by mouth daily.    . verapamil (COVERA HS) 240 MG (CO) 24 hr tablet Take 240 mg by mouth 2 (two) times daily.     . vitamin C (ASCORBIC ACID) 500 MG tablet Take 500 mg by mouth daily.     No current facility-administered medications for this visit.    REVIEW OF SYSTEMS:   Constitutional: Denies fevers, chills or abnormal night sweats Eyes: Denies blurriness of vision, double vision or watery eyes Ears, nose, mouth, throat, and face: Denies mucositis or sore throat Cardiovascular: Denies palpitation, chest discomfort or lower extremity swelling Gastrointestinal:  Denies nausea, heartburn or change in bowel habits Skin: Denies abnormal skin rashes Lymphatics: Denies new lymphadenopathy or easy bruising Neurological:Denies numbness, tingling or new weaknesses Behavioral/Psych: Mood is stable, no new changes  All other systems were reviewed with the patient and are negative.  PHYSICAL EXAMINATION: ECOG PERFORMANCE STATUS: 0 - Asymptomatic  Vitals:   10/05/19 1317  BP: 135/60  Pulse: 75  Resp: 18  Temp: 97.7 F (36.5 C)  SpO2: 100%   Filed Weights   10/05/19 1317  Weight: 217 lb 6.4 oz (98.6 kg)    GENERAL:alert, no distress and comfortable SKIN: skin color, texture, turgor are normal, no rashes or significant lesions EYES: normal, conjunctiva are pink and non-injected, sclera  clear OROPHARYNX:no exudate, no erythema and lips, buccal mucosa, and tongue normal  NECK: supple, thyroid normal size, non-tender, without nodularity LYMPH:  no palpable lymphadenopathy in the cervical, axillary or inguinal LUNGS: clear to auscultation and percussion with normal breathing effort HEART: regular rate & rhythm and no murmurs and no lower extremity edema ABDOMEN:abdomen soft, non-tender and normal bowel sounds Musculoskeletal:no cyanosis of digits and no clubbing  PSYCH: alert & oriented x 3 with fluent speech NEURO: no focal motor/sensory deficits  LABORATORY DATA:  I have reviewed the data as listed Recent Results (from the past 2160 hour(s))  CBC with Differential/Platelet     Status: Abnormal   Collection Time: 10/05/19  1:38 PM  Result Value Ref Range   WBC 5.4 4.0 - 10.5 K/uL   RBC 3.84 (L) 3.87 - 5.11 MIL/uL   Hemoglobin 12.0 12.0 - 15.0 g/dL   HCT 37.2 36 - 46 %   MCV 96.9 80.0 - 100.0 fL   MCH 31.3 26.0 - 34.0 pg   MCHC 32.3 30.0 - 36.0 g/dL   RDW 13.2 11.5 - 15.5 %   Platelets 190 150 - 400 K/uL   nRBC 0.0 0.0 - 0.2 %   Neutrophils Relative % 46 %   Neutro Abs 2.5 1.7 - 7.7 K/uL   Lymphocytes Relative 40 %   Lymphs Abs 2.2 0.7 - 4.0 K/uL   Monocytes Relative 8 %   Monocytes Absolute 0.4 0 - 1 K/uL   Eosinophils Relative 5 %   Eosinophils Absolute 0.3 0 - 0 K/uL   Basophils Relative 1 %   Basophils Absolute 0.0 0 - 0 K/uL   Immature Granulocytes 0 %   Abs Immature Granulocytes 0.01 0.00 - 0.07 K/uL    Comment: Performed at [facility], [address] [place], [place] [address]  Vitamin B12  Status: Abnormal   Collection Time: 10/05/19  1:38 PM  Result Value Ref Range   Vitamin B-12 1,385 (H) 180 - 914 pg/mL    Comment: RESULTS CONFIRMED BY MANUAL DILUTION (NOTE) This assay is not validated for testing neonatal or myeloproliferative syndrome specimens for Vitamin B12 levels. Performed at [facility], [place] [address], [place], [place] [address]     ASSESSMENT & PLAN Leukopenia My initial thought was her leukopenia could be due to mineral deficiency given her history of gastric surgery At the time of dictation, repeat CBC came back normal She does not need long-term follow-up  Obesity, Class III, BMI 40-49.9 (morbid obesity) ([place]) She has recurrent weight gain soon after surgery She appears motivated to lose weight again We discussed the risk and benefits of referral to see the team at weight management center and she is in agreement  Musculoskeletal pain, chronic She has musculoskeletal pain in her arms and legs The cause is unknown but could be related to degenerative arthritis I will defer to her primary care doctor for management She is on vitamin D supplement    Orders Placed This Encounter  Procedures  . CBC with Differential/Platelet    Standing Status:   Standing    Number of Occurrences:   22    Standing Expiration Date:   10/04/2020  . Copper, serum    Standing Status:   Future    Number of Occurrences:   1    Standing Expiration Date:   10/04/2020  . Vitamin B12    Standing Status:   Future    Number of Occurrences:   1    Standing Expiration Date:   10/04/2020  . Amb Ref to Medical Weight Management    Referral Priority:   Routine    Referral Type:   Consultation    Number of Visits Requested:   1    All questions were answered. The patient knows to call the clinic with any problems, questions or concerns. The total time spent in the appointment was 55 minutes and more than 50% was on counseling.     Heath Lark, MD 10/06/2019 7:12 AM

## 2019-10-05 ENCOUNTER — Other Ambulatory Visit: Payer: Self-pay

## 2019-10-05 ENCOUNTER — Inpatient Hospital Stay: Payer: Medicare PPO | Attending: Hematology and Oncology

## 2019-10-05 ENCOUNTER — Encounter: Payer: Self-pay | Admitting: Hematology and Oncology

## 2019-10-05 ENCOUNTER — Inpatient Hospital Stay: Payer: Medicare PPO | Admitting: Hematology and Oncology

## 2019-10-05 VITALS — BP 135/60 | HR 75 | Temp 97.7°F | Resp 18 | Ht 62.0 in | Wt 217.4 lb

## 2019-10-05 DIAGNOSIS — Z9884 Bariatric surgery status: Secondary | ICD-10-CM

## 2019-10-05 DIAGNOSIS — J302 Other seasonal allergic rhinitis: Secondary | ICD-10-CM | POA: Diagnosis not present

## 2019-10-05 DIAGNOSIS — R7989 Other specified abnormal findings of blood chemistry: Secondary | ICD-10-CM

## 2019-10-05 DIAGNOSIS — D72819 Decreased white blood cell count, unspecified: Secondary | ICD-10-CM | POA: Insufficient documentation

## 2019-10-05 DIAGNOSIS — R519 Headache, unspecified: Secondary | ICD-10-CM | POA: Diagnosis not present

## 2019-10-05 DIAGNOSIS — M7918 Myalgia, other site: Secondary | ICD-10-CM | POA: Diagnosis not present

## 2019-10-05 DIAGNOSIS — G8929 Other chronic pain: Secondary | ICD-10-CM

## 2019-10-05 DIAGNOSIS — Z8616 Personal history of COVID-19: Secondary | ICD-10-CM | POA: Diagnosis not present

## 2019-10-05 DIAGNOSIS — M25611 Stiffness of right shoulder, not elsewhere classified: Secondary | ICD-10-CM | POA: Diagnosis not present

## 2019-10-05 LAB — CBC WITH DIFFERENTIAL/PLATELET
Abs Immature Granulocytes: 0.01 10*3/uL (ref 0.00–0.07)
Basophils Absolute: 0 10*3/uL (ref 0.0–0.1)
Basophils Relative: 1 %
Eosinophils Absolute: 0.3 10*3/uL (ref 0.0–0.5)
Eosinophils Relative: 5 %
HCT: 37.2 % (ref 36.0–46.0)
Hemoglobin: 12 g/dL (ref 12.0–15.0)
Immature Granulocytes: 0 %
Lymphocytes Relative: 40 %
Lymphs Abs: 2.2 10*3/uL (ref 0.7–4.0)
MCH: 31.3 pg (ref 26.0–34.0)
MCHC: 32.3 g/dL (ref 30.0–36.0)
MCV: 96.9 fL (ref 80.0–100.0)
Monocytes Absolute: 0.4 10*3/uL (ref 0.1–1.0)
Monocytes Relative: 8 %
Neutro Abs: 2.5 10*3/uL (ref 1.7–7.7)
Neutrophils Relative %: 46 %
Platelets: 190 10*3/uL (ref 150–400)
RBC: 3.84 MIL/uL — ABNORMAL LOW (ref 3.87–5.11)
RDW: 13.2 % (ref 11.5–15.5)
WBC: 5.4 10*3/uL (ref 4.0–10.5)
nRBC: 0 % (ref 0.0–0.2)

## 2019-10-05 LAB — VITAMIN B12: Vitamin B-12: 1385 pg/mL — ABNORMAL HIGH (ref 180–914)

## 2019-10-06 ENCOUNTER — Telehealth: Payer: Self-pay

## 2019-10-06 ENCOUNTER — Encounter: Payer: Self-pay | Admitting: Hematology and Oncology

## 2019-10-06 DIAGNOSIS — M7918 Myalgia, other site: Secondary | ICD-10-CM | POA: Insufficient documentation

## 2019-10-06 NOTE — Assessment & Plan Note (Signed)
She has musculoskeletal pain in her arms and legs The cause is unknown but could be related to degenerative arthritis I will defer to her primary care doctor for management She is on vitamin D supplement

## 2019-10-06 NOTE — Telephone Encounter (Signed)
Called and given below message. She verbalized understanding. 

## 2019-10-06 NOTE — Telephone Encounter (Signed)
-----   Message from Heath Lark, MD sent at 10/06/2019  7:14 AM EDT ----- Regarding: pls call and let her know her repeat CBC is actually back to normal. no need f/up. I have sent referral to weight Management center, they will call her

## 2019-10-06 NOTE — Assessment & Plan Note (Signed)
My initial thought was her leukopenia could be due to mineral deficiency given her history of gastric surgery At the time of dictation, repeat CBC came back normal She does not need long-term follow-up

## 2019-10-06 NOTE — Assessment & Plan Note (Signed)
She has recurrent weight gain soon after surgery She appears motivated to lose weight again We discussed the risk and benefits of referral to see the team at weight management center and she is in agreement

## 2019-10-07 LAB — COPPER, SERUM: Copper: 143 ug/dL (ref 80–158)

## 2019-10-13 ENCOUNTER — Encounter: Payer: Self-pay | Admitting: Allergy & Immunology

## 2019-10-13 ENCOUNTER — Other Ambulatory Visit: Payer: Self-pay

## 2019-10-13 ENCOUNTER — Ambulatory Visit: Payer: Medicare PPO | Admitting: Allergy & Immunology

## 2019-10-13 VITALS — BP 110/60 | HR 81 | Temp 97.3°F | Resp 16 | Ht 62.0 in | Wt 215.4 lb

## 2019-10-13 DIAGNOSIS — J302 Other seasonal allergic rhinitis: Secondary | ICD-10-CM | POA: Diagnosis not present

## 2019-10-13 DIAGNOSIS — J3089 Other allergic rhinitis: Secondary | ICD-10-CM | POA: Diagnosis not present

## 2019-10-13 DIAGNOSIS — J310 Chronic rhinitis: Secondary | ICD-10-CM | POA: Diagnosis not present

## 2019-10-13 NOTE — Progress Notes (Signed)
NEW PATIENT  Date of Service/Encounter:  10/13/19  Referring provider: Leighton Ruff, MD   Assessment:   Chronic rhinitis (grasses, trees, indoor molds, outdoor molds and dust mites)  Plan/Recommendations:   1. Chronic rhinitis - Testing today showed: grasses, trees, indoor molds, outdoor molds and dust mites - Copy of test results provided.  - Avoidance measures provided. - Start taking: Nasacort (triamcinolone) one spray per nostril daily and Astelin (azelastine) 2 sprays per nostril 1-2 times daily as needed - You can use an extra dose of the antihistamine, if needed, for breakthrough symptoms.  - Consider nasal saline rinses 1-2 times daily to remove allergens from the nasal cavities as well as help with mucous clearance (this is especially helpful to do before the nasal sprays are given) - Strongly consider allergy shots as a means of long-term control. - Allergy shots "re-train" and "reset" the immune system to ignore environmental allergens and decrease the resulting immune response to those allergens (sneezing, itchy watery eyes, runny nose, nasal congestion, etc).    - Allergy shots improve symptoms in 75-85% of patients.  - We can discuss more at the next appointment if the medications are not working for you.  2. Return in about 2 months (around 12/13/2019). This can be an in-person, a virtual Webex or a telephone follow up visit.  Subjective:   Beth Sullivan is a 74 y.o. 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  Beth Sullivan was referred by Leighton Ruff, MD.     Beth Sullivan is a 74 y.o. female presenting for an evaluation of environmental allergies.    Asthma/Respiratory Symptom History: She has been on albuterol in the3 past but she does not have an updated one right. She never gets prednisone for her breathing at all.  Allergic Rhinitis Symptom History: She reports that when she wakes up in the mornings, she is full of green dried mucous. She has this all day long. Sometimes it is clear as well. She reports itchy eyes as well. These symptoms occur year round. She has been on multiple antihistamines and nose sprays without any help. She does sleep with a CPAP but it was happening even before then. She reports that this is a mess. This is the first time that she has been tested. Pollen bothers her. She did have a dog that did not seem t bother her all. She does get sinusitis often but she mostly tends to push through the symptoms. Even with antibiotics, she will continue to have this postnasal drip.   Otherwise, there is no history of other atopic diseases, including asthma, food allergies, drug allergies, stinging insect allergies, eczema, urticaria or contact dermatitis. There is no significant infectious history. Vaccinations  are up to date.    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 If you have any questions, ask your nurse or doctor.        aspirin EC 81 MG tablet Take 81 mg by mouth at bedtime.   atorvastatin 10 MG tablet Commonly known as: LIPITOR Take 10 mg by mouth at bedtime.   betamethasone dipropionate 0.05 % cream Apply 1 application topically. 2-3 times weekly.   CALCIUM 500 PO Take 500 mg by mouth every other day.   chlorthalidone 25 MG tablet Commonly known as: HYGROTON Take 25 mg by mouth daily.   cloNIDine 0.1 mg/24hr patch Commonly known as: CATAPRES - Dosed in mg/24 hr Place 1 patch onto the skin every Sunday.   finasteride 5 MG tablet Commonly known as: PROSCAR Take 2.5 mg by mouth daily.   losartan 25 MG tablet Commonly known as: COZAAR Take 25 mg by mouth daily.   multivitamin with minerals Tabs tablet Take 1 tablet by mouth daily.   spironolactone 25 MG tablet Commonly known as: ALDACTONE Take 25 mg by mouth daily.   Valtrex 1000 MG tablet Generic drug: valACYclovir Take 500 mg by mouth 2 (two) times daily.   verapamil 240 MG (CO) 24 hr tablet Commonly known as: COVERA HS Take 240 mg by mouth 2 (two) times daily.   vitamin C 500 MG tablet Commonly known as: ASCORBIC ACID Take 500 mg by mouth daily.   VITAMIN D3 PO Take 5,000 Int'l Units by mouth every other day.       Birth History: non-contributory  Developmental History: non-contributory  Past Surgical History: Past Surgical History:   Procedure Laterality Date   ABDOMINAL HYSTERECTOMY  1975   BSO   BREAST EXCISIONAL BIOPSY Left 2015   papilloma   BREAST LUMPECTOMY WITH NEEDLE LOCALIZATION Left 05/12/2013   Procedure: BREAST LUMPECTOMY WITH NEEDLE LOCALIZATION;  Surgeon: Marcello Moores A. Cornett, MD;  Location: [facility];  Service: General;  Laterality: Left;   [place]  2008   both cataracts   FOOT SURGERY  08-2011   Left   HAND SURGERY Right    LAPAROSCOPIC GASTRIC SLEEVE RESECTION N/A 04/30/2017   Procedure: LAPAROSCOPIC GASTRIC SLEEVE RESECTION WITH UPPER ENDO;  Surgeon: Johnathan Hausen, MD;  Location: [facility];  Service: General;  Laterality: N/A;   right CTS     TUBAL LIGATION     VESICOVAGINAL FISTULA CLOSURE W/ TAH     vocal cord surgery  07/2017     Family History: Family History  Problem Relation Age of Onset   Heart disease Mother  Unaware of specifics   Hypertension Mother    Diabetes Mother    Heart attack Mother    Diabetes Father    Hypertension Father    Prostate cancer Brother    Prostate cancer Brother    Hypertension Sister    Heart disease Sister        Unaware of specifics   Heart disease Brother        Our specifics   Colon cancer Maternal Grandmother    Migraines Brother    Breast cancer Neg Hx      Social History: Jarika lives at home with her family.  She lives in a house that is 73 years old.  She is unsure if there is water or mildew damage.  There is hardwood in the main living areas and carpeting in the bedrooms.  She has gas heating and central cooling.  There are no animals inside or outside of the home.  She does not have dust mite covers on the bedding.  There is no tobacco exposure.  [place]  She does not use a HEPA filter in the home.  He is not exposed to chemicals or fumes. 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    Review of Systems  Constitutional: Negative.  Negative for chills, fever, malaise/fatigue and weight loss.  HENT: Positive for congestion and sinus pain. Negative for ear discharge and ear pain.        Positive for postnasal drip. Positive for throat clearing.  Eyes: Negative for pain, discharge and redness.  Respiratory: Negative for cough, sputum production, shortness of breath and wheezing.   Cardiovascular: Negative.  Negative for chest pain and palpitations.  Gastrointestinal: Negative for abdominal pain, constipation, diarrhea, heartburn, nausea and vomiting.  Skin: Negative.  Negative for itching and rash.  Neurological: Negative for dizziness and headaches.  Endo/Heme/Allergies: Positive for environmental allergies. Does not bruise/bleed easily.       Objective:   Blood pressure 110/60, pulse 81, temperature (!) 97.3 F (36.3 C), temperature source Temporal, resp. rate 16, height 5\' 2"  (1.575 m), weight 215 lb 6.4 oz (97.7 kg), SpO2 96 %. Body mass index is 39.4 kg/m.   Physical Exam:   Physical Exam Constitutional:      Appearance: She is well-developed.     Comments: Talkative female.  Cooperative with exam.  HENT:     Head: Normocephalic and atraumatic.     Right Ear: Tympanic membrane, ear canal and external ear normal. No drainage, swelling or tenderness. Tympanic membrane is not injected, scarred, erythematous, retracted or bulging.     Left Ear: Tympanic membrane, ear canal and external ear normal. No drainage, swelling or tenderness. Tympanic membrane is not injected, scarred, erythematous, retracted or bulging.     Nose: No nasal deformity, septal deviation, mucosal edema or rhinorrhea.     Right Turbinates: Enlarged and swollen.     Left Turbinates: Enlarged and swollen.     Right Sinus: No maxillary sinus tenderness or frontal sinus tenderness.     Left Sinus: No maxillary sinus tenderness or frontal sinus  tenderness.     Comments: Moderate cobblestoning of posterior oropharynx.    Mouth/Throat:     Mouth: Mucous membranes are not pale and not dry.     Pharynx: Uvula midline.  Eyes:     General:  Right eye: No discharge.        Left eye: No discharge.     Conjunctiva/sclera: Conjunctivae normal.     Right eye: Right conjunctiva is not injected. No chemosis.    Left eye: Left conjunctiva is not injected. No chemosis.    Pupils: Pupils are equal, round, and reactive to light.  Cardiovascular:     Rate and Rhythm: Normal rate and regular rhythm.     Heart sounds: Normal heart sounds.  Pulmonary:     Effort: Pulmonary effort is normal. No tachypnea, accessory muscle usage or respiratory distress.     Breath sounds: Normal breath sounds. No wheezing, rhonchi or rales.     Comments: Moving air well in all lung fields.  No increased work of breathing. Chest:     Chest wall: No tenderness.  Abdominal:     Tenderness: There is no abdominal tenderness. There is no guarding or rebound.  Lymphadenopathy:     Head:     Right side of head: No submandibular, tonsillar or occipital adenopathy.     Left side of head: No submandibular, tonsillar or occipital adenopathy.     Cervical: No cervical adenopathy.  Skin:    Coloration: Skin is not pale.     Findings: No abrasion, erythema, petechiae or rash. Rash is not papular, urticarial or vesicular.     Comments: No eczematous or urticarial lesions noted.  Neurological:     Mental Status: She is alert.      Diagnostic studies:     Allergy Studies:     Airborne Adult Perc - 10/13/19 1410    Time Antigen Placed 1411    Allergen Manufacturer Lavella Hammock    Location Back    Number of Test 59    Panel 1 Select    1. Control-Buffer 50% Glycerol Negative    2. Control-Histamine 1 mg/ml 2+    3. Albumin saline Negative    4. Sawyer Negative    5. [place] Negative    6. Johnson Negative    7. [place] Blue Negative    8. Meadow Fescue Negative     9. Perennial Rye Negative    10. Sweet Vernal Negative    11. Timothy Negative    12. Cocklebur Negative    13. Burweed Marshelder Negative    14. Ragweed, short Negative    15. Ragweed, Giant Negative    16. Plantain,  English Negative    17. Lamb's Quarters Negative    18. Sheep Sorrell Negative    19. Rough Pigweed Negative    20. Marsh Elder, Rough Negative    21. Mugwort, Common Negative    22. Ash mix 3+    23. Birch mix Negative    24. Beech American Negative    25. Box, Elder Negative    26. Cedar, red Negative    27. Cottonwood, [place] Negative    28. Elm mix Negative    29. Hickory Negative    30. Maple mix Negative    31. Oak, [place] mix Negative    32. Pecan Pollen Negative    33. Pine mix Negative    34. Sycamore Eastern Negative    35. Hoke, Black Pollen Negative    36. Alternaria alternata Negative    37. Cladosporium Herbarum Negative    38. Aspergillus mix Negative    39. Penicillium mix Negative    40. Bipolaris sorokiniana (Helminthosporium) Negative    41. Drechslera spicifera (Curvularia) Negative  42. Mucor plumbeus Negative    43. Fusarium moniliforme Negative    44. Aureobasidium pullulans (pullulara) Negative    45. Rhizopus oryzae Negative    46. Botrytis cinera Negative    47. Epicoccum nigrum Negative    48. Phoma betae Negative    49. Candida Albicans Negative    50. Trichophyton mentagrophytes Negative    51. Mite, D Farinae  5,000 AU/ml Negative    52. Mite, D Pteronyssinus  5,000 AU/ml Negative    53. Cat Hair 10,000 BAU/ml Negative    54.  Dog Epithelia Negative    55. Mixed Feathers Negative    56. Horse Epithelia Negative    57. Cockroach, German Negative    58. Mouse Negative    59. Tobacco Leaf Negative          Intradermal - 10/13/19 1428    Time Antigen Placed 1428    Allergen Manufacturer Greer    Location Arm    Number of Test 14    Control Negative    [place] Negative    Johnson Negative    7 Grass 1+      Ragweed mix Negative    Weed mix Negative    Mold 1 Negative    Mold 2 Negative    Mold 3 1+    Mold 4 1+    Cat Negative    Dog Negative    Cockroach Negative    Mite mix 4+           Allergy testing results were read and interpreted by myself, documented by clinical staff.         Salvatore Marvel, MD [facility]

## 2019-10-13 NOTE — Patient Instructions (Addendum)
1. Chronic rhinitis - Testing today showed: grasses, trees, indoor molds, outdoor molds and dust mites - Copy of test results provided.  - Avoidance measures provided. - Start taking: Nasacort (triamcinolone) one spray per nostril daily and Astelin (azelastine) 2 sprays per nostril 1-2 times daily as needed - You can use an extra dose of the antihistamine, if needed, for breakthrough symptoms.  - Consider nasal saline rinses 1-2 times daily to remove allergens from the nasal cavities as well as help with mucous clearance (this is especially helpful to do before the nasal sprays are given) - Strongly consider allergy shots as a means of long-term control. - Allergy shots "re-train" and "reset" the immune system to ignore environmental allergens and decrease the resulting immune response to those allergens (sneezing, itchy watery eyes, runny nose, nasal congestion, etc).    - Allergy shots improve symptoms in 75-85% of patients.  - We can discuss more at the next appointment if the medications are not working for you.  2. Return in about 2 months (around 12/13/2019). This can be an in-person, a virtual Webex or a telephone follow up visit.   Please inform us of any Emergency Department visits, hospitalizations, or changes in symptoms. Call us before going to the ED for breathing or allergy symptoms since we might be able to fit you in for a sick visit. Feel free to contact us anytime with any questions, problems, or concerns.  It was a pleasure to meet you today!  Websites that have reliable patient information: 1. American Academy of Asthma, Allergy, and Immunology: www.aaaai.org 2. Food Allergy Research and Education (FARE): foodallergy.org 3. Mothers of Asthmatics: http://www.asthmacommunitynetwork.org 4. American College of Allergy, Asthma, and Immunology: www.acaai.org   COVID-19 Vaccine Information can be found at:  [email or web address] For questions related to vaccine distribution or appointments, please email [email or web address] or call [phone].     "Like" [place] on Facebook and Instagram for our latest updates!        Make sure you are registered to vote! If you have moved or changed any of your contact information, you will need to get this updated before voting!  In some cases, you MAY be able to register to vote online: [email or web address]    Reducing Pollen Exposure  The American Academy of Allergy, Asthma and Immunology suggests the following steps to reduce your exposure to pollen during allergy seasons.    1. Do not hang sheets or clothing out to dry; pollen may collect on these items. 2. Do not mow lawns or spend time around freshly cut grass; mowing stirs up pollen. 3. Keep windows closed at night.  Keep car windows closed while driving. 4. Minimize morning activities outdoors, a time when pollen counts are usually at their highest. 5. Stay indoors as much as possible when pollen counts or humidity is high and on windy days when pollen tends to remain in the air longer. 6. Use air conditioning when possible.  Many air conditioners have filters that trap the pollen spores. 7. Use a HEPA room air filter to remove pollen form the indoor air you breathe.  Control of Mold Allergen   Mold and fungi can grow on a variety of surfaces provided certain temperature and moisture conditions exist.  Outdoor molds grow on plants, decaying vegetation and soil.  The major outdoor mold, Alternaria and Cladosporium, are found in very high numbers during hot and dry conditions.  Generally, a late Summer - Fall peak is seen for common outdoor fungal spores.  Rain will temporarily lower outdoor mold spore count, but counts rise rapidly when the rainy period ends.  The most important indoor molds are Aspergillus and  Penicillium.  Dark, humid and poorly ventilated basements are ideal sites for mold growth.  The next most common sites of mold growth are the bathroom and the kitchen.  Outdoor (Seasonal) Mold Control  Positive outdoor molds via skin testing: Bipolaris (Helminthsporium), Drechslera (Curvalaria) and Mucor  1. Use air conditioning and keep windows closed 2. Avoid exposure to decaying vegetation. 3. Avoid leaf raking. 4. Avoid grain handling. 5. Consider wearing a face mask if working in moldy areas.  6.   Indoor (Perennial) Mold Control   Positive indoor molds via skin testing: Fusarium, Aureobasidium (Pullulara) and Rhizopus  1. Maintain humidity below 50%. 2. Clean washable surfaces with 5% bleach solution. 3. Remove sources e.g. contaminated carpets.     Control of Dust Mite Allergen    Dust mites play a major role in allergic asthma and rhinitis.  They occur in environments with high humidity wherever human skin is found.  Dust mites absorb humidity from the atmosphere (ie, they do not drink) and feed on organic matter (including shed human and animal skin).  Dust mites are a microscopic type of insect that you cannot see with the naked eye.  High levels of dust mites have been detected from mattresses, pillows, carpets, upholstered furniture, bed covers, clothes, soft toys and any woven material.  The principal allergen of the dust mite is found in its feces.  A gram of dust may contain 1,000 mites and 250,000 fecal particles.  Mite antigen is easily measured in the air during house cleaning activities.  Dust mites do not bite and do not cause harm to humans, other than by triggering allergies/asthma.    Ways to decrease your exposure to dust mites in your home:  1. Encase mattresses, box springs and pillows with a mite-impermeable barrier or cover   2. Wash sheets, blankets and drapes weekly in hot water (130 F) with detergent and dry them in a dryer on the hot setting.  3. Have  the room cleaned frequently with a vacuum cleaner and a damp dust-mop.  For carpeting or rugs, vacuuming with a vacuum cleaner equipped with a high-efficiency particulate air (HEPA) filter.  The dust mite allergic individual should not be in a room which is being cleaned and should wait 1 hour after cleaning before going into the room. 4. Do not sleep on upholstered furniture (eg, couches).   5. If possible removing carpeting, upholstered furniture and drapery from the home is ideal.  Horizontal blinds should be eliminated in the rooms where the person spends the most time (bedroom, study, television room).  Washable vinyl, roller-type shades are optimal. 6. Remove all non-washable stuffed toys from the bedroom.  Wash stuffed toys weekly like sheets and blankets above.   7. Reduce indoor humidity to less than 50%.  Inexpensive humidity monitors can be purchased at most hardware stores.  Do not use a humidifier as can make the problem worse and are not recommended.  Allergy Shots   Allergies are the result of a chain reaction that starts in the immune system. Your immune system controls how your body defends itself. For instance, if you have an allergy to pollen, your immune system identifies pollen as an invader or allergen. Your immune system overreacts by producing antibodies called Immunoglobulin E (IgE). These antibodies travel to cells that release chemicals, causing an allergic  reaction.  The concept behind allergy immunotherapy, whether it is received in the form of shots or tablets, is that the immune system can be desensitized to specific allergens that trigger allergy symptoms. Although it requires time and patience, the payback can be long-term relief.  How Do Allergy Shots Work?  Allergy shots work much like a vaccine. Your body responds to injected amounts of a particular allergen given in increasing doses, eventually developing a resistance and tolerance to it. Allergy shots can lead to  decreased, minimal or no allergy symptoms.  There generally are two phases: build-up and maintenance. Build-up often ranges from three to six months and involves receiving injections with increasing amounts of the allergens. The shots are typically given once or twice a week, though more rapid build-up schedules are sometimes used.  The maintenance phase begins when the most effective dose is reached. This dose is different for each person, depending on how allergic you are and your response to the build-up injections. Once the maintenance dose is reached, there are longer periods between injections, typically two to four weeks.  Occasionally doctors give cortisone-type shots that can temporarily reduce allergy symptoms. These types of shots are different and should not be confused with allergy immunotherapy shots.  Who Can Be Treated with Allergy Shots?  Allergy shots may be a good treatment approach for people with allergic rhinitis (hay fever), allergic asthma, conjunctivitis (eye allergy) or stinging insect allergy.   Before deciding to begin allergy shots, you should consider:  . The length of allergy season and the severity of your symptoms . Whether medications and/or changes to your environment can control your symptoms . Your desire to avoid long-term medication use . Time: allergy immunotherapy requires a major time commitment . Cost: may vary depending on your insurance coverage  Allergy shots for children age 92 and older are effective and often well tolerated. They might prevent the onset of new allergen sensitivities or the progression to asthma.  Allergy shots are not started on patients who are pregnant but can be continued on patients who become pregnant while receiving them. In some patients with other medical conditions or who take certain common medications, allergy shots may be of risk. It is important to mention other medications you talk to your allergist.   When Will  I Feel Better?  Some may experience decreased allergy symptoms during the build-up phase. For others, it may take as long as 12 months on the maintenance dose. If there is no improvement after a year of maintenance, your allergist will discuss other treatment options with you.  If you aren't responding to allergy shots, it may be because there is not enough dose of the allergen in your vaccine or there are missing allergens that were not identified during your allergy testing. Other reasons could be that there are high levels of the allergen in your environment or major exposure to non-allergic triggers like tobacco smoke.  What Is the Length of Treatment?  Once the maintenance dose is reached, allergy shots are generally continued for three to five years. The decision to stop should be discussed with your allergist at that time. Some people may experience a permanent reduction of allergy symptoms. Others may relapse and a longer course of allergy shots can be considered.  What Are the Possible Reactions?  The two types of adverse reactions that can occur with allergy shots are local and systemic. Common local reactions include very mild redness and swelling at the injection site,  which can happen immediately or several hours after. A systemic reaction, which is less common, affects the entire body or a particular body system. They are usually mild and typically respond quickly to medications. Signs include increased allergy symptoms such as sneezing, a stuffy nose or hives.  Rarely, a serious systemic reaction called anaphylaxis can develop. Symptoms include swelling in the throat, wheezing, a feeling of tightness in the chest, nausea or dizziness. Most serious systemic reactions develop within 30 minutes of allergy shots. This is why it is strongly recommended you wait in your doctor's office for 30 minutes after your injections. Your allergist is trained to watch for reactions, and his or her staff  is trained and equipped with the proper medications to identify and treat them.  Who Should Administer Allergy Shots?  The preferred location for receiving shots is your prescribing allergist's office. Injections can sometimes be given at another facility where the physician and staff are trained to recognize and treat reactions, and have received instructions by your prescribing allergist.

## 2019-10-14 MED ORDER — AZELASTINE HCL 0.1 % NA SOLN
2.0000 | Freq: Two times a day (BID) | NASAL | 5 refills | Status: DC
Start: 2019-10-14 — End: 2020-10-20

## 2019-10-14 NOTE — Addendum Note (Signed)
Addended by: Valere Dross on: 10/14/2019 03:31 PM   Modules accepted: Orders

## 2019-10-26 ENCOUNTER — Ambulatory Visit: Payer: Medicare Other | Admitting: Internal Medicine

## 2019-10-26 ENCOUNTER — Encounter: Payer: Self-pay | Admitting: Internal Medicine

## 2019-10-26 ENCOUNTER — Other Ambulatory Visit: Payer: Self-pay

## 2019-10-26 VITALS — BP 110/58 | HR 66 | Temp 98.2°F | Ht 62.0 in | Wt 215.0 lb

## 2019-10-26 DIAGNOSIS — G4733 Obstructive sleep apnea (adult) (pediatric): Secondary | ICD-10-CM | POA: Diagnosis not present

## 2019-10-26 DIAGNOSIS — U071 COVID-19: Secondary | ICD-10-CM | POA: Diagnosis not present

## 2019-10-26 NOTE — Progress Notes (Signed)
HPI F never smoker seen for OSA  Insomnia, complicated by morbid obesity/ gastric sleeve bariatric surgery, HBP, Upper Airway Cough, GERD,  NPSG 05/23/12- AHI 42.6 per hour. Weight 267 pounds Office spirometry 04/30/18- .WNL --------------------------------------------------------------------   10/24/2018-  74 year old female never smoker followed for OSA, Insomnia, complicated by morbid obesity/ gastric sleeve bariatric surgery, HBP, Upper Airway Cough, GERD,  CPAP auto 10-20/[facility]    This machine < 85 year old. We sent prednisone and tessalon perles 08/14/18 -----OSA on CPAP 10-20, DME: [facility], pt states she feels there may be a leakage in her mask Body weight today - 208 lbs almost 60 lbs less than original NPSG Mask has ben leaking/ poor seal for months.  Cough returned, feels like thick clear mucus from throat- no chest.Spirometry was WNL. Perles help some. No SOB. CXR 04/30/18-  IMPRESSION: No acute cardiopulmonary disease. Stable examination.  7/12.21- 74 year old female never smoker followed for OSA, Insomnia, complicated by morbid obesity/ gastric sleeve bariatric surgery, HBP, Upper Airway Cough, GERD, Covid infection Jan 2021,  CPAP auto 10-15/[facility] Body weight today 215 lbs Download compliance 63%, AHI 10.3/ hr, more than half are centrals              Short and missed nights Nasal pillows mask slips off. Wants to try Full Face mask and higher pressure. Covid vax- 2 Ph[employer] Discussed Covid ifection in January- outpatient.   ROS-see HPI   + = positive Constitutional:   +   weight loss, night sweats, fevers, chills, fatigue, lassitude. HEENT:   No-  headaches, difficulty swallowing, tooth/dental problems, sore throat,       No-  sneezing, itching, ear ache, +nasal congestion, post nasal drip,  CV:  No-   chest pain, orthopnea, PND, swelling in lower extremities, anasarca, dizziness, palpitations Resp: +shortness of breath with exertion or at rest.       No-   productive cough,  No  non-productive cough,  No- coughing up of blood.              No-   change in color of mucus.  No- wheezing.   Skin: No-   rash or lesions. GI:  No-   heartburn, indigestion, abdominal pain, nausea, vomiting,  GU:  MS:  +  joint pain or swelling.   Neuro-     nothing unusual Psych:  No- change in mood or affect. No depression or anxiety.  No memory loss.  OBJ- Physical Exam General- Alert, Oriented, Affect-appropriate, Distress- none acute. + Obese still Skin- rash-none, lesions- none, excoriation- none Lymphadenopathy- none Head- atraumatic            Eyes- Gross vision intact, PERRLA, conjunctivae and secretions clear            Ears- Hearing, canals-normal            Nose- Clear, no-Septal dev, mucus, polyps, erosion, perforation             Throat- Mallampati II-III , mucosa clear , drainage- none, tonsils- atrophic,  Neck- flexible , trachea midline, no stridor , thyroid nl, carotid no bruit Chest - symmetrical excursion , unlabored           Heart/CV- RRR , no murmur , no gallop  , no rub, nl s1 s2                           - JVD- none , edema- none, stasis changes- none, varices- none  Lung- clear to P&A, wheeze- none, cough-none, dullness-none, rub- none           Chest wall-  Abd-  Br/ Gen/ Rectal- Not done, not indicated Extrem- cyanosis- none, clubbing, none, atrophy- none, strength- nl Neuro- grossly intact to observation

## 2019-10-26 NOTE — Patient Instructions (Signed)
Order- DME [facility] please change auto range to 10-20. Refit mask of choice. Continue humidifier, supplies, AirView/ card  Order- schedule mask fit at sleep disorders center  Dx OSA  Please call if we can help

## 2019-10-27 ENCOUNTER — Encounter (INDEPENDENT_AMBULATORY_CARE_PROVIDER_SITE_OTHER): Payer: Self-pay | Admitting: Family Medicine

## 2019-10-27 ENCOUNTER — Other Ambulatory Visit: Payer: Self-pay

## 2019-10-27 ENCOUNTER — Ambulatory Visit (INDEPENDENT_AMBULATORY_CARE_PROVIDER_SITE_OTHER): Payer: Medicare PPO | Admitting: Family Medicine

## 2019-10-27 VITALS — BP 110/68 | HR 58 | Temp 97.9°F | Ht 62.0 in | Wt 214.0 lb

## 2019-10-27 DIAGNOSIS — R7303 Prediabetes: Secondary | ICD-10-CM | POA: Diagnosis not present

## 2019-10-27 DIAGNOSIS — Z0289 Encounter for other administrative examinations: Secondary | ICD-10-CM

## 2019-10-27 DIAGNOSIS — R5383 Other fatigue: Secondary | ICD-10-CM

## 2019-10-27 DIAGNOSIS — Z6839 Body mass index (BMI) 39.0-39.9, adult: Secondary | ICD-10-CM

## 2019-10-27 DIAGNOSIS — I10 Essential (primary) hypertension: Secondary | ICD-10-CM

## 2019-10-27 DIAGNOSIS — F3289 Other specified depressive episodes: Secondary | ICD-10-CM | POA: Diagnosis not present

## 2019-10-27 DIAGNOSIS — R0602 Shortness of breath: Secondary | ICD-10-CM | POA: Diagnosis not present

## 2019-10-27 DIAGNOSIS — E7849 Other hyperlipidemia: Secondary | ICD-10-CM

## 2019-10-27 DIAGNOSIS — Z9884 Bariatric surgery status: Secondary | ICD-10-CM

## 2019-10-27 DIAGNOSIS — E559 Vitamin D deficiency, unspecified: Secondary | ICD-10-CM | POA: Diagnosis not present
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
## 2019-10-27 NOTE — Progress Notes (Signed)
110/68   

## 2019-10-28 LAB — CBC WITH DIFFERENTIAL/PLATELET
Basophils Absolute: 0 10*3/uL (ref 0.0–0.2)
Basos: 0 %
EOS (ABSOLUTE): 0.1 10*3/uL (ref 0.0–0.4)
Eos: 3 %
Hematocrit: 37.9 % (ref 34.0–46.6)
Hemoglobin: 12.3 g/dL (ref 11.1–15.9)
Immature Grans (Abs): 0 10*3/uL (ref 0.0–0.1)
Immature Granulocytes: 0 %
Lymphocytes Absolute: 2 10*3/uL (ref 0.7–3.1)
Lymphs: 44 %
MCH: 30.7 pg (ref 26.6–33.0)
MCHC: 32.5 g/dL (ref 31.5–35.7)
MCV: 95 fL (ref 79–97)
Monocytes Absolute: 0.3 10*3/uL (ref 0.1–0.9)
Monocytes: 7 %
Neutrophils Absolute: 2 10*3/uL (ref 1.4–7.0)
Neutrophils: 46 %
Platelets: 196 10*3/uL (ref 150–450)
RBC: 4.01 x10E6/uL (ref 3.77–5.28)
RDW: 12.5 % (ref 11.7–15.4)
WBC: 4.5 10*3/uL (ref 3.4–10.8)

## 2019-10-28 LAB — LIPID PANEL WITH LDL/HDL RATIO
Cholesterol, Total: 176 mg/dL (ref 100–199)
HDL: 75 mg/dL (ref >39)
LDL Chol Calc (NIH): 91 mg/dL (ref 0–99)
LDL/HDL Ratio: 1.2 ratio (ref 0.0–3.2)
Triglycerides: 49 mg/dL (ref 0–149)
VLDL Cholesterol Cal: 10 mg/dL (ref 5–40)

## 2019-10-28 LAB — COMPREHENSIVE METABOLIC PANEL
ALT: 14 IU/L (ref 0–32)
AST: 22 IU/L (ref 0–40)
Albumin/Globulin Ratio: 1.7 (ref 1.2–2.2)
Albumin: 4.8 g/dL — ABNORMAL HIGH (ref 3.7–4.7)
Alkaline Phosphatase: 87 IU/L (ref 48–121)
BUN/Creatinine Ratio: 28 (ref 12–28)
BUN: 30 mg/dL — ABNORMAL HIGH (ref 8–27)
Bilirubin Total: 0.5 mg/dL (ref 0.0–1.2)
CO2: 22 mmol/L (ref 20–29)
Calcium: 10 mg/dL (ref 8.7–10.3)
Chloride: 100 mmol/L (ref 96–106)
Creatinine, Ser: 1.08 mg/dL — ABNORMAL HIGH (ref 0.57–1.00)
GFR calc Af Amer: 59 mL/min/{1.73_m2} — ABNORMAL LOW (ref >59)
GFR calc non Af Amer: 51 mL/min/{1.73_m2} — ABNORMAL LOW (ref >59)
Globulin, Total: 2.9 g/dL (ref 1.5–4.5)
Glucose: 81 mg/dL (ref 65–99)
Potassium: 4.6 mmol/L (ref 3.5–5.2)
Sodium: 137 mmol/L (ref 134–144)
Total Protein: 7.7 g/dL (ref 6.0–8.5)

## 2019-10-28 LAB — T4, FREE: Free T4: 1.05 ng/dL (ref 0.82–1.77)

## 2019-10-28 LAB — VITAMIN B12: Vitamin B-12: 1663 pg/mL — ABNORMAL HIGH (ref 232–1245)

## 2019-10-28 LAB — VITAMIN D 25 HYDROXY (VIT D DEFICIENCY, FRACTURES): Vit D, 25-Hydroxy: 57.6 ng/mL (ref 30.0–100.0)

## 2019-10-28 LAB — T3: T3, Total: 96 ng/dL (ref 71–180)

## 2019-10-28 LAB — HEMOGLOBIN A1C
Est. average glucose Bld gHb Est-mCnc: 114 mg/dL
Hgb A1c MFr Bld: 5.6 % (ref 4.8–5.6)

## 2019-10-28 LAB — FOLATE: Folate: >20 ng/mL (ref >3.0)

## 2019-10-28 LAB — TSH: TSH: 2.08 u[IU]/mL (ref 0.450–4.500)

## 2019-10-28 LAB — INSULIN, RANDOM: INSULIN: 11.9 u[IU]/mL (ref 2.6–24.9)

## 2019-11-02 ENCOUNTER — Ambulatory Visit (HOSPITAL_BASED_OUTPATIENT_CLINIC_OR_DEPARTMENT_OTHER): Payer: Medicare PPO | Attending: Internal Medicine | Admitting: Internal Medicine

## 2019-11-02 DIAGNOSIS — G4733 Obstructive sleep apnea (adult) (pediatric): Secondary | ICD-10-CM

## 2019-11-02 NOTE — Progress Notes (Signed)
**Note DeSullivanIdentified via Obfuscation** Chief Complaint:   Beth Beth Sullivan (MR# [id number]) is a 74 y.o. female who presents for evaluation and treatment of Beth and related comorbidities. Current BMI is Body mass index is 39.14 kg/m. Beth Beth Sullivan has been struggling with her weight for many years and has been unsuccessful in either losing weight, maintaining weight loss, or reaching her healthy weight goal.  Beth Beth Sullivan is currently in the action stage of change and ready to dedicate time achieving and maintaining a healthier weight. Beth Beth Sullivan is interested in becoming our patient and working on intensive lifestyle modifications including (but not limited to) diet and exercise for weight loss.  Beth Beth Sullivan's habits were reviewed today and are as follows: Her family eats meals together, she struggles with family and or coworkers weight loss sabotage, her desired weight loss is 29 lbs, she has been heavy most of her life, she started gaining weight 10 years ago, her heaviest weight ever was 278 pounds, she has significant food cravings issues, she snacks frequently in the evenings, she skips meals frequently, she is frequently drinking liquids with calories, she frequently makes poor food choices, she has problems with excessive hunger, she frequently eats larger portions than normal and she struggles with emotional eating.  Depression Screen Beth Beth Sullivan's Food and Mood (modified PHQSullivan9) score was 18.  Depression screen PHQ 2/9 10/27/2019  Decreased Interest 3  Down, Depressed, Hopeless 2  PHQ - 2 Score 5  Altered sleeping 1  Tired, decreased energy 3  Change in appetite 2  Feeling bad or failure about yourself  3  Trouble concentrating 3  Moving slowly or fidgety/restless 1  Suicidal thoughts 0  PHQSullivan9 Score 18  Difficult doing work/chores Not difficult at all   Subjective:   1. Other fatigue Beth Sullivan admits to daytime somnolence and admits to waking up still tired. Beth Sullivan has a history of symptoms of daytime fatigue. Beth Beth Sullivan generally gets 4  hours of sleep per night, and states that she has nightime awakenings. Snoring is present. Apneic episodes are present. Epworth Sleepiness Score is 17.  2. Shortness of breath on exertion Beth Beth Sullivan notes increasing shortness of breath with exercising and seems to be worsening over time with weight gain. She notes getting out of breath sooner with activity than she used to. This has not gotten worse recently. Beth Beth Sullivan denies shortness of breath at rest or orthopnea.  3. Essential hypertension Beth Sullivan's blood pressure is well controlled today on her medications. She is at risk of some electrolyte imbalances.  4. PreSullivandiabetes Beth Beth Sullivan has a history of preSullivandiabetes. She is not on metformin.  5. Vitamin D deficiency Beth Beth Sullivan  Is on Vit D OTC 5,000 IU 3 times weekly, and she is on multivitamins.  6. Other hyperlipidemia Beth Beth Sullivan is on statin, and she denies chest pain or myalgias. She is attempting to improve with diet.  7. Status post laparoscopic sleeve gastrectomy Beth Beth Sullivan is status post gastric sleeve in 2019. She has gone from 188 lbs to 280 lbs in 15 months. She started regaining weight 18 months later, and she is attempting to get back to her lowest weight.   8. Other depression with emotional eating Beth Beth Sullivan notes significant emotional eating, and she feels this has contributed to significant weight gain.  Assessment/Plan:   1. Other fatigue Beth Sullivan does feel that her weight is causing her energy to be lower than it should be. Fatigue may be related to Beth, depression or many other causes. Labs will be ordered, and in the meanwhile, Beth Beth Sullivan will focus  on self care including making healthy food choices, increasing physical activity and focusing on stress reduction.  - EKG 12SullivanLead - CBC with Differential/Platelet - Vitamin B12 - Folate - T3 - T4, free - TSH  2. Shortness of breath on exertion Beth Beth Sullivan does feel that she gets out of breath more easily that she used to when she exercises. Beth Beth Sullivan's shortness  of breath appears to be Beth related and exercise induced. She has agreed to work on weight loss and gradually increase exercise to treat her exercise induced shortness of breath. Will continue to monitor closely.  - CBC with Differential/Platelet - Vitamin B12 - Folate  3. Essential hypertension Beth Beth Sullivan will start her Category 2 plan, and will continue working on healthy weight loss and exercise to improve blood pressure control. We will watch for signs of hypotension as she continues her lifestyle modifications. We will check labs today.  - Comprehensive metabolic panel  4. PreSullivandiabetes Beth Beth Sullivan will start her Category 2 plan, and will continue to work on weight loss, exercise, and decreasing simple carbohydrates to help decrease the risk of diabetes. We will check labs today.  - Hemoglobin A1c - Insulin, random  5. Vitamin D deficiency Low Vitamin D level contributes to fatigue and are associated with Beth, breast, and colon cancer. We will check labs today, and Beth Beth Sullivan will followSullivanup for routine testing of Vitamin D, at least 2Sullivan3 times per year to avoid overSullivanreplacement.  - VITAMIN D 25 Hydroxy (VitSullivanD Deficiency, Fractures)  6. Other hyperlipidemia Cardiovascular risk and specific lipid/LDL goals reviewed. We discussed several lifestyle modifications today. Beth Beth Sullivan will start her Category 2 plan, and will continue to work on exercise and weight loss efforts. We will check labs today. Orders and follow up as documented in patient record.   - Comprehensive metabolic panel - Lipid Panel With LDL/HDL Ratio  7. Status post laparoscopic sleeve gastrectomy Beth Beth Sullivan will start her Category 2 plan, and will follow up as directed.  8. Other depression with emotional eating Behavior modification techniques were discussed today to help Beth Beth Sullivan deal with her emotional/nonSullivanhunger eating behaviors. We will refer to Beth Beth Sullivan, our Bariatric Psychologist for evaluation. Orders and follow up as documented  in patient record.   9. Class 2 severe Beth with serious comorbidity and body mass index (BMI) of 39.0 to 39.9 in adult, unspecified Beth type [facility]) Beth Beth Sullivan is currently in the action stage of change and her goal is to continue with weight loss efforts. I recommend Beth Beth Sullivan begin the structured treatment plan as follows:  She has agreed to the Category 2 Plan.  Exercise goals: No exercise has been prescribed for now, while we concentrate on nutritional changes.  Behavioral modification strategies: increasing lean protein intake, no skipping meals and dealing with family or coworker sabotage.  She was informed of the importance of frequent followSullivanup visits to maximize her success with intensive lifestyle modifications for her multiple health conditions. She was informed we would discuss her lab results at her next visit unless there is a critical issue that needs to be addressed sooner. Beth Beth Sullivan agreed to keep her next visit at the agreed upon time to discuss these results.  Objective:   Blood pressure 110/68, pulse (!) 58, temperature 97.9 F (36.6 C), temperature source Oral, height 5\' 2"  (1.575 m), weight 214 lb (97.1 kg), SpO2 100 %. Body mass index is 39.14 kg/m.  EKG: Normal sinus rhythm, rate 59 BPM.  Indirect Calorimeter completed today shows a VO2 of 208 and a REE of 1449.  Her calculated basal metabolic rate is 2694 thus her basal metabolic rate is worse than expected.  General: Cooperative, alert, well developed, in no acute distress. HEENT: Conjunctivae and lids unremarkable. Cardiovascular: Regular rhythm.  Lungs: Normal work of breathing. Neurologic: No focal deficits.   Lab Results  Component Value Date   CREATININE 1.08 (H) 10/27/2019   BUN 30 (H) 10/27/2019   NA 137 10/27/2019   K 4.6 10/27/2019   CL 100 10/27/2019   CO2 22 10/27/2019   Lab Results  Component Value Date   ALT 14 10/27/2019   AST 22 10/27/2019   ALKPHOS 87 10/27/2019   BILITOT 0.5 10/27/2019    Lab Results  Component Value Date   HGBA1C 5.6 10/27/2019   Lab Results  Component Value Date   INSULIN 11.9 10/27/2019   Lab Results  Component Value Date   TSH 2.080 10/27/2019   Lab Results  Component Value Date   CHOL 176 10/27/2019   HDL 75 10/27/2019   LDLCALC 91 10/27/2019   TRIG 49 10/27/2019   Lab Results  Component Value Date   WBC 4.5 10/27/2019   HGB 12.3 10/27/2019   HCT 37.9 10/27/2019   MCV 95 10/27/2019   PLT 196 10/27/2019   No results found for: IRON, TIBC, FERRITIN Beth Behavioral Intervention Visit Documentation for Insurance:   Approximately 15 minutes were spent on the discussion below.  ASK: We discussed the diagnosis of Beth with Beth Beth Sullivan today and Jesselle agreed to give [place] permission to discuss Beth behavioral modification therapy today.  ASSESS: Marria has the diagnosis of Beth and her BMI today is 39.13. Lus is in the action stage of change.   ADVISE: Patty was educated on the multiple health risks of Beth as well as the benefit of weight loss to improve her health. She was advised of the need for long term treatment and the importance of lifestyle modifications to improve her current health and to decrease her risk of future health problems.  AGREE: Multiple dietary modification options and treatment options were discussed and Eleanor agreed to follow the recommendations documented in the above note.  ARRANGE: Gabriellia was educated on the importance of frequent visits to treat Beth as outlined per CMS and USPSTF guidelines and agreed to schedule her next follow up appointment today.  Attestation Statements:   Reviewed by clinician on day of visit: allergies, medications, problem list, medical history, surgical history, family history, social history, and previous encounter notes.   I, Trixie Dredge, am acting as transcriptionist for Dennard Nip, MD.  I have reviewed the above documentation for accuracy and completeness,  and I agree with the above. - Dennard Nip, MD

## 2019-11-03 ENCOUNTER — Other Ambulatory Visit: Payer: Self-pay

## 2019-11-10 ENCOUNTER — Ambulatory Visit (INDEPENDENT_AMBULATORY_CARE_PROVIDER_SITE_OTHER): Payer: Medicare PPO | Admitting: Psychology

## 2019-11-10 ENCOUNTER — Other Ambulatory Visit: Payer: Self-pay

## 2019-11-10 ENCOUNTER — Encounter (INDEPENDENT_AMBULATORY_CARE_PROVIDER_SITE_OTHER): Payer: Self-pay | Admitting: Family Medicine

## 2019-11-10 ENCOUNTER — Ambulatory Visit (INDEPENDENT_AMBULATORY_CARE_PROVIDER_SITE_OTHER): Payer: Medicare PPO | Admitting: Family Medicine

## 2019-11-10 VITALS — BP 110/70 | HR 63 | Temp 97.6°F | Ht 62.0 in | Wt 206.0 lb

## 2019-11-10 DIAGNOSIS — F5089 Other specified eating disorder: Secondary | ICD-10-CM

## 2019-11-10 DIAGNOSIS — E7849 Other hyperlipidemia: Secondary | ICD-10-CM | POA: Diagnosis not present

## 2019-11-10 DIAGNOSIS — N1831 Chronic kidney disease, stage 3a: Secondary | ICD-10-CM

## 2019-11-10 DIAGNOSIS — I10 Essential (primary) hypertension: Secondary | ICD-10-CM

## 2019-11-10 DIAGNOSIS — Z6837 Body mass index (BMI) 37.0-37.9, adult: Secondary | ICD-10-CM | POA: Diagnosis not present

## 2019-11-10 DIAGNOSIS — E8881 Metabolic syndrome: Secondary | ICD-10-CM | POA: Diagnosis not present

## 2019-11-10 NOTE — Progress Notes (Signed)
Chief Complaint:   OBESITY Beth Sullivan is here to discuss her progress with her obesity treatment plan along with follow-up of her obesity related diagnoses. Beth Sullivan is on the Category 2 Plan and states she is following her eating plan approximately 100% of the time. Beth Sullivan states she is doing 0 minutes 0 times per week.  Today's visit was #: 2 Starting weight: 214 lbs Starting date: 10/27/2019 Today's weight: 206 lbs Today's date: 11/10/2019 Total lbs lost to date: 8 Total lbs lost since last in-office visit: 8  Interim History: Beth Sullivan has done well with weight loss on her Category 2 plan. Her hunger was mostly controlled but she felt "snacky" in the evenings.  Subjective:   1. Other hyperlipidemia Beth Sullivan's levels are will controlled on Lipitor. She denies chest pain or myalgias. She is doing well with diet. I discussed labs with the patient today.  2. Insulin resistance Beth Sullivan has a new diagnosis of insulin resistance. Her A1c and glucose are within normal limits, but fasting insulin is elevated. She notes evening polyphagia. I discussed labs with the patient today.  3. Essential hypertension Beth Sullivan's blood pressure is well controlled but she is on multiple medications including a Catapres patch. She has occasional feelings of lightheadedness but not often. She is working on Designer, fashion/clothing.  4. Chronic renal impairment, stage 3a Beth Sullivan's BUN and creatinine are elevated, but improving from earlier in the year. Her blood pressure is well controlled. I discussed labs with the patient today.  Assessment/Plan:   1. Other hyperlipidemia Cardiovascular risk and specific lipid/LDL goals reviewed. We discussed several lifestyle modifications today. Beth Sullivan will continue her medications, and will continue to work on diet, exercise and weight loss efforts. We will recheck labs in 3 months. Orders and follow up as documented in patient record.   2. Insulin resistance Beth Sullivan will continue to work on  weight loss, diet, exercise, and decreasing simple carbohydrates to help decrease the risk of diabetes. We will recheck labs in 3 months. Beth Sullivan agreed to follow-up with [place] as directed to closely monitor her progress.  3. Essential hypertension Beth Sullivan will continue to work on diet, healthy weight loss and exercise to improve blood pressure control. We will continue to monitor closely, and will watch for signs of hypotension as she continues her lifestyle modifications. Beth Sullivan is worried about risk of low blood pressure with weight loss.  4. Chronic renal impairment, stage 3a Beth Sullivan will continue with diet and weight loss, and she will increase her water intake and will continue to monitor.  5. Class 2 severe obesity with serious comorbidity and body mass index (BMI) of 37.0 to 37.9 in adult, unspecified obesity type Beth [facility]) Beth Sullivan is currently in the action stage of change. As such, her goal is to continue with weight loss efforts. She has agreed to the Category 2 Plan.   100 calorie snack options given today.  Behavioral modification strategies: increasing lean protein intake, decreasing simple carbohydrates and better snacking choices.  Beth Sullivan has agreed to follow-up with Beth Sullivan. She was informed of the importance of frequent follow-up visits to maximize her success with intensive lifestyle modifications for her multiple health conditions.   Objective:   Blood pressure 110/70, pulse 63, temperature 97.6 F (36.4 C), temperature source Oral, height 5\' 2"  (1.575 m), weight (!) 206 lb (93.4 kg), SpO2 98 %. Body mass index is 37.68 kg/m.  General: Cooperative, alert, well developed, in no acute distress. HEENT: Conjunctivae and lids unremarkable. Cardiovascular:  Regular rhythm.  Lungs: Normal work of breathing. Neurologic: No focal deficits.   Lab Results  Component Value Date   CREATININE 1.08 (H) 10/27/2019   BUN 30 (H) 10/27/2019   NA 137 10/27/2019   K 4.6 10/27/2019   CL  100 10/27/2019   CO2 22 10/27/2019   Lab Results  Component Value Date   ALT 14 10/27/2019   AST 22 10/27/2019   ALKPHOS 87 10/27/2019   BILITOT 0.5 10/27/2019   Lab Results  Component Value Date   HGBA1C 5.6 10/27/2019   Lab Results  Component Value Date   INSULIN 11.9 10/27/2019   Lab Results  Component Value Date   TSH 2.080 10/27/2019   Lab Results  Component Value Date   CHOL 176 10/27/2019   HDL 75 10/27/2019   LDLCALC 91 10/27/2019   TRIG 49 10/27/2019   Lab Results  Component Value Date   WBC 4.5 10/27/2019   HGB 12.3 10/27/2019   HCT 37.9 10/27/2019   MCV 95 10/27/2019   PLT 196 10/27/2019   No results found for: IRON, TIBC, FERRITIN  Obesity Behavioral Intervention Documentation for Insurance:   Approximately 15 minutes were spent on the discussion below.  ASK: We discussed the diagnosis of obesity with Beth Sullivan today and Beth Sullivan agreed to give [place] permission to discuss obesity behavioral modification therapy today.  ASSESS: Beth Sullivan has the diagnosis of obesity and her BMI today is 37.67. Beth Sullivan is in the action stage of change.   ADVISE: Beth Sullivan was educated on the multiple health risks of obesity as well as the benefit of weight loss to improve her health. She was advised of the need for long term treatment and the importance of lifestyle modifications to improve her current health and to decrease her risk of future health problems.  AGREE: Multiple dietary modification options and treatment options were discussed and Beth Sullivan agreed to follow the recommendations documented in the above note.  ARRANGE: Beth Sullivan was educated on the importance of frequent visits to treat obesity as outlined per CMS and USPSTF guidelines and agreed to schedule her next follow up appointment today.  Attestation Statements:   Reviewed by clinician on day of visit: allergies, medications, problem list, medical history, surgical history, family history, social history, and previous  encounter notes.   I, Trixie Dredge, am acting as transcriptionist for Dennard Nip, MD.  I have reviewed the above documentation for accuracy and completeness, and I agree with the above. -  Dennard Nip, MD

## 2019-11-16 NOTE — Progress Notes (Signed)
  Office: [phone]  /  Fax: [phone]    Date: November 30, 2019   Time Seen: 8:27am Duration: 28 minutes Provider: Glennie Isle, Psy.D. Type of Session: Individual Therapy  Type of Contact: Face-to-face  Session Content: Beth Sullivan is a 74 y.o. female presenting for a follow-up appointment to address the previously established treatment goal of increasing coping skills. The session was initiated with a brief check-in. [name] shared about a recent trip, noting deviations from her meal plan. Notably, she described making better choices and engaging in portion control. Positive reinforcement was provided. Consequences of going long periods without eating were discussed. Emotional and physical hunger were reviewed. Psychoeducation regarding triggers for emotional eating was provided. [name] was provided a handout, and encouraged to utilize the handout between now and the next appointment to increase awareness of triggers and frequency. [name] agreed. This provider also discussed behavioral strategies for specific triggers, such as placing the utensil down when conversing to avoid mindless eating. [name] was receptive to today's appointment as evidenced by openness to sharing, responsiveness to feedback, and willingness to explore triggers for emotional eating.  Mental Status Examination:  Appearance: well groomed and appropriate hygiene  Behavior: appropriate to circumstances Mood: euthymic Affect: mood congruent Speech: normal in rate, volume, and tone Eye Contact: appropriate Psychomotor Activity: appropriate Gait: normal Thought Process: linear, logical, and goal directed  Thought Content/Perception: no hallucinations, delusions, bizarre thinking or behavior reported or observed and no evidence of suicidal and homicidal ideation, plan, and intent Orientation: time, person, place, and purpose of appointment Memory/Concentration: memory, attention, language, and fund of knowledge intact    Insight/Judgment: good   Interventions:  Conducted a brief chart review Provided empathic reflections and validation Employed supportive psychotherapy interventions to facilitate reduced distress and to improve coping skills with identified stressors Psychoeducation provided regarding triggers for emotional eating  Reviewed content from last appointment   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 [name] has demonstrated progress in her goal as evidenced by increased awareness of hunger patterns.   Plan: Based on recent progress per [name]'s self-report, the frequency of appointments will be reduced. As such, the next appointment will be scheduled in three weeks via [place] Visit. The next session will focus on working towards the established treatment goal.

## 2019-11-19 ENCOUNTER — Encounter (HOSPITAL_COMMUNITY): Payer: Self-pay

## 2019-11-25 ENCOUNTER — Other Ambulatory Visit: Payer: Self-pay

## 2019-11-25 ENCOUNTER — Encounter (INDEPENDENT_AMBULATORY_CARE_PROVIDER_SITE_OTHER): Payer: Self-pay | Admitting: Family Medicine

## 2019-11-25 ENCOUNTER — Ambulatory Visit (INDEPENDENT_AMBULATORY_CARE_PROVIDER_SITE_OTHER): Payer: Medicare PPO | Admitting: Family Medicine

## 2019-11-25 VITALS — BP 104/70 | HR 66 | Temp 97.9°F | Ht 62.0 in | Wt 205.0 lb

## 2019-11-25 DIAGNOSIS — Z6837 Body mass index (BMI) 37.0-37.9, adult: Secondary | ICD-10-CM | POA: Diagnosis not present

## 2019-11-25 DIAGNOSIS — I10 Essential (primary) hypertension: Secondary | ICD-10-CM

## 2019-11-26 DIAGNOSIS — G4733 Obstructive sleep apnea (adult) (pediatric): Secondary | ICD-10-CM | POA: Diagnosis not present

## 2019-11-26 NOTE — Progress Notes (Signed)
Chief Complaint:   OBESITY Beth Sullivan is here to discuss her progress with her obesity treatment plan along with follow-up of her obesity related diagnoses. Beth Sullivan is on the Category 2 Plan and states she is following her eating plan approximately 90% of the time. Beth Sullivan states she is doing 0 minutes 0 times per week.  Today's visit was #: 3 Starting weight: 214 lbs Starting date: 10/27/2019 Today's weight: 205 lbs Today's date: 11/25/2019 Total lbs lost to date: 9 Total lbs lost since last in-office visit: 1  Interim History: Beth Sullivan continues to do well with weight loss on her Category 2 plan. Her hunger is controlled overall but she struggles to eat all the food on her plan.  Subjective:   1. Essential hypertension Beth Sullivan's blood pressure is stable on her medications. She denies feeling lightheaded. Her blood pressure is well controlled but she is at risk of hypotension.  Assessment/Plan:   1. Essential hypertension Athene is working on healthy weight loss and exercise to improve blood pressure control. We will watch for signs of hypotension as she continues her lifestyle modifications. She will increase her water intake and will continue to monitor closely.  2. Class 2 severe obesity with serious comorbidity and body mass index (BMI) of 37.0 to 37.9 in adult, unspecified obesity type Beth Sullivan) Beth Sullivan is currently in the action stage of change. As such, her goal is to continue with weight loss efforts. She has agreed to the Category 2 Plan and keeping a food journal and adhering to recommended goals of 300-450 calories and 30+ grams of protein at supper daily.   Exercise goals: For substantial health benefits, adults should do at least 150 minutes (2 hours and 30 minutes) a week of moderate-intensity, or 75 minutes (1 hour and 15 minutes) a week of vigorous-intensity aerobic physical activity, or an equivalent combination of moderate- and vigorous-intensity aerobic activity. Aerobic activity  should be performed in episodes of at least 10 minutes, and preferably, it should be spread throughout the week.  Behavioral modification strategies: increasing lean protein intake and increasing water intake.  Beth Sullivan has agreed to follow-up with our clinic in 3 weeks. She was informed of the importance of frequent follow-up visits to maximize her success with intensive lifestyle modifications for her multiple health conditions.   Objective:   Blood pressure 104/70, pulse 66, temperature 97.9 F (36.6 C), temperature source Oral, height 5\' 2"  (1.575 m), weight 205 lb (93 kg), SpO2 98 %. Body mass index is 37.49 kg/m.  General: Cooperative, alert, well developed, in no acute distress. HEENT: Conjunctivae and lids unremarkable. Cardiovascular: Regular rhythm.  Lungs: Normal work of breathing. Neurologic: No focal deficits.   Lab Results  Component Value Date   CREATININE 1.08 (H) 10/27/2019   BUN 30 (H) 10/27/2019   NA 137 10/27/2019   K 4.6 10/27/2019   CL 100 10/27/2019   CO2 22 10/27/2019   Lab Results  Component Value Date   ALT 14 10/27/2019   AST 22 10/27/2019   ALKPHOS 87 10/27/2019   BILITOT 0.5 10/27/2019   Lab Results  Component Value Date   HGBA1C 5.6 10/27/2019   Lab Results  Component Value Date   INSULIN 11.9 10/27/2019   Lab Results  Component Value Date   TSH 2.080 10/27/2019   Lab Results  Component Value Date   CHOL 176 10/27/2019   HDL 75 10/27/2019   LDLCALC 91 10/27/2019   TRIG 49 10/27/2019   Lab Results  Component  Value Date   WBC 4.5 10/27/2019   HGB 12.3 10/27/2019   HCT 37.9 10/27/2019   MCV 95 10/27/2019   PLT 196 10/27/2019   No results found for: IRON, TIBC, FERRITIN  Obesity Behavioral Intervention Documentation for Insurance:   Approximately 15 minutes were spent on the discussion below.  ASK: We discussed the diagnosis of obesity with Beth Sullivan today and Beth Sullivan agreed to give [place] permission to discuss obesity behavioral  modification therapy today.  ASSESS: Beth Sullivan has the diagnosis of obesity and her BMI today is 37.49. Beth Sullivan is in the action stage of change.   ADVISE: Beth Sullivan was educated on the multiple health risks of obesity as well as the benefit of weight loss to improve her health. She was advised of the need for long term treatment and the importance of lifestyle modifications to improve her current health and to decrease her risk of future health problems.  AGREE: Multiple dietary modification options and treatment options were discussed and Beth Sullivan agreed to follow the recommendations documented in the above note.  ARRANGE: Beth Sullivan was educated on the importance of frequent visits to treat obesity as outlined per CMS and USPSTF guidelines and agreed to schedule her next follow up appointment today.  Attestation Statements:   Reviewed by clinician on day of visit: allergies, medications, problem list, medical history, surgical history, family history, social history, and previous encounter notes.   I, Trixie Dredge, am acting as transcriptionist for Dennard Nip, MD.  I have reviewed the above documentation for accuracy and completeness, and I agree with the above. -  Dennard Nip, MD

## 2019-11-30 ENCOUNTER — Other Ambulatory Visit: Payer: Self-pay

## 2019-11-30 ENCOUNTER — Ambulatory Visit (INDEPENDENT_AMBULATORY_CARE_PROVIDER_SITE_OTHER): Payer: Medicare PPO | Admitting: Psychology

## 2019-11-30 DIAGNOSIS — F5089 Other specified eating disorder: Secondary | ICD-10-CM

## 2019-12-03 DIAGNOSIS — G4730 Sleep apnea, unspecified: Secondary | ICD-10-CM | POA: Diagnosis not present

## 2019-12-03 DIAGNOSIS — G894 Chronic pain syndrome: Secondary | ICD-10-CM | POA: Diagnosis not present

## 2019-12-03 DIAGNOSIS — Z8616 Personal history of COVID-19: Secondary | ICD-10-CM | POA: Diagnosis not present

## 2019-12-03 DIAGNOSIS — E559 Vitamin D deficiency, unspecified: Secondary | ICD-10-CM | POA: Diagnosis not present

## 2019-12-03 DIAGNOSIS — I10 Essential (primary) hypertension: Secondary | ICD-10-CM | POA: Diagnosis not present

## 2019-12-03 DIAGNOSIS — R7303 Prediabetes: Secondary | ICD-10-CM | POA: Diagnosis not present

## 2019-12-03 DIAGNOSIS — E785 Hyperlipidemia, unspecified: Secondary | ICD-10-CM | POA: Diagnosis not present

## 2019-12-08 NOTE — Progress Notes (Signed)
Office: [phone]  /  Fax: [phone]    Date: December 22, 2019   Appointment Start Time: 8:04am Duration: 22 minutes Provider: Glennie Isle, Psy.D. Type of Session: Individual Therapy  Location of Patient: Home Location of Provider: Provider's Home Type of Contact: Telepsychological Visit via MyChart Video Visit   This provider called [name] at 8:02am as she did not present for the telepsychological appointment. She indicated she was unsure how to join; assistance was provided. As such, today's appointment was initiated 4 minutes late.   Informed Consent: Prior to initiating telepsychological services, [name] completed an informed consent document, which included the development of a safety plan (e.g., an emergency contact and nearest emergency room) in the event of an emergency/crisis. Kenya expressed understanding of the rationale of the safety plan. Kaylynne verbally acknowledged understanding she is ultimately responsible for understanding her insurance benefits for telepsychological and in-person services. This provider also reviewed confidentiality, as it relates to telepsychological services, as well as the rationale for telepsychological services (i.e., to reduce exposure risk to COVID-19). Paullette  acknowledged understanding that appointments cannot be recorded without both party consent and she is aware she is responsible for securing confidentiality on her end of the session. Kaylla verbally consented to proceed.  Session Content: Beth Sullivan is a 74 y.o. female presenting for a follow-up appointment to address the previously established treatment goal of increasing coping skills. Today's appointment was a telepsychological visit due to COVID-19. [name] provided verbal consent for today's telepsychological appointment and she is aware she is responsible for securing confidentiality on her end of the session. Prior to proceeding with today's appointment, Alane's physical location at the time of  this appointment was obtained as well a phone number she could be reached at in the event of technical difficulties. Kinleigh and this provider participated in today's telepsychological service.   This provider conducted a brief check-in. [name] shared about her birthday celebration. She stated she continues to try to "stay on track," adding she continues to lose weight. Positive reinforcement was provided. Triggers for emotional eating reviewed. She acknowledged she continues to have the desire to "nibble before going to bed." This was further explored and it was reflected she may be experiencing physical hunger based on when she is eating dinner. Additionally, psychoeducation regarding mindfulness was provided. A handout was provided to [facility] with further information regarding mindfulness, including exercises. This provider also explained the benefit of mindfulness as it relates to emotional eating. [name] was encouraged to engage in the provided exercises between now and the next appointment with this provider. [name] agreed. During today's appointment, [name] was led through a mindfulness exercise involving her senses. Pier provided verbal consent during today's appointment for this provider to send a handout about mindfulness via e-mail. [name] was receptive to today's appointment as evidenced by openness to sharing, responsiveness to feedback, and willingness to engage in mindfulness exercises to assist with coping.  Mental Status Examination:  Appearance: well groomed and appropriate hygiene  Behavior: appropriate to circumstances Mood: euthymic Affect: mood congruent Speech: normal in rate, volume, and tone Eye Contact: appropriate Psychomotor Activity: appropriate Gait: unable to assess Thought Process: linear, logical, and goal directed  Thought Content/Perception: no hallucinations, delusions, bizarre thinking or behavior reported or observed and no evidence of suicidal and homicidal ideation,  plan, and intent Orientation: time, person, place, and purpose of appointment Memory/Concentration: memory, attention, language, and fund of knowledge intact  Insight/Judgment: good  Interventions:  Conducted a brief chart review Provided empathic reflections and  validation Reviewed content from the previous session Employed supportive psychotherapy interventions to facilitate reduced distress and to improve coping skills with identified stressors Psychoeducation provided regarding mindfulness Engaged patient in mindfulness exercise(s) Employed acceptance and commitment interventions to emphasize mindfulness and acceptance without struggle  Positive reinforcement was provided   DSM-5 Diagnosis(es): 307.59 (F50.8) Other Specified Feeding or Eating Disorder, Emotional Eating Behaviors  Treatment Goal & Progress: During the initial appointment with this provider, the following treatment goal was established: increase coping skills. [name] has demonstrated progress in her goal as evidenced by increased awareness of hunger patterns and increased awareness of triggers for emotional eating. [name] also demonstrates willingness to engage in mindfulness exercises.  Plan: Due to recent progress per [name]'s self-report, frequency of appointments will be reduced. As such, the next appointment will be scheduled in three weeks, which will be via MyChart Video Visit. The next session will focus on working towards the established treatment goal and termination planning.

## 2019-12-10 DIAGNOSIS — Z09 Encounter for follow-up examination after completed treatment for conditions other than malignant neoplasm: Secondary | ICD-10-CM | POA: Diagnosis not present

## 2019-12-16 ENCOUNTER — Ambulatory Visit (INDEPENDENT_AMBULATORY_CARE_PROVIDER_SITE_OTHER): Payer: Medicare PPO | Admitting: Family Medicine

## 2019-12-16 ENCOUNTER — Other Ambulatory Visit: Payer: Self-pay

## 2019-12-16 ENCOUNTER — Encounter (INDEPENDENT_AMBULATORY_CARE_PROVIDER_SITE_OTHER): Payer: Self-pay | Admitting: Family Medicine

## 2019-12-16 VITALS — BP 107/69 | HR 65 | Temp 97.4°F | Ht 62.0 in | Wt 202.0 lb

## 2019-12-16 DIAGNOSIS — Z6837 Body mass index (BMI) 37.0-37.9, adult: Secondary | ICD-10-CM

## 2019-12-16 DIAGNOSIS — I10 Essential (primary) hypertension: Secondary | ICD-10-CM

## 2019-12-17 NOTE — Progress Notes (Signed)
Chief Complaint:   OBESITY Jadaya is here to discuss her progress with her obesity treatment plan along with follow-up of her obesity related diagnoses. Jaylia is on the Category 2 Plan and keeping a food journal and adhering to recommended goals of 300-450 calories and 30+ grams of protein at supper daily and states she is following her eating plan approximately 90% of the time. Lacy states she is on the treadmill for 15 minutes 3 times per week.  Today's visit was #: 4 Starting weight: 214 lbs Starting date: 10/27/2019 Today's weight: 202 lbs Today's date: 12/16/2019 Total lbs lost to date: 12 Total lbs lost since last in-office visit: 3  Interim History: Akita continues to do well with weight loss on her Category 2 plan. She notes her hunger is controlled and she is tolerating her plan well.  Subjective:   1. Essential hypertension Shelly's blood pressure is well controlled and she continues to decrease weight loss. She notes some increase in feeling lightheaded, and she is on multiple anti-hypertensive medications.  Assessment/Plan:   1. Essential hypertension Lakeya is working on healthy weight loss and exercise to improve blood pressure control. We will watch for signs of hypotension as she continues her lifestyle modifications. Zenora agreed to discontinue Catapres and will check her blood press daily. We will follow up in 2 week.  2. Class 2 severe obesity with serious comorbidity and body mass index (BMI) of 37.0 to 37.9 in adult, unspecified obesity type [facility]) Iyannah is currently in the action stage of change. As such, her goal is to continue with weight loss efforts. She has agreed to the Category 2 Plan.   Exercise goals: As is.  Behavioral modification strategies: increasing lean protein intake and increasing water intake.  Maghan has agreed to follow-up with our clinic in 2 weeks. She was informed of the importance of frequent follow-up visits to maximize her success with  intensive lifestyle modifications for her multiple health conditions.   Objective:   Blood pressure 107/69, pulse 65, temperature (!) 97.4 F (36.3 C), height 5\' 2"  (1.575 m), weight 202 lb (91.6 kg), SpO2 99 %. Body mass index is 36.95 kg/m.  General: Cooperative, alert, well developed, in no acute distress. HEENT: Conjunctivae and lids unremarkable. Cardiovascular: Regular rhythm.  Lungs: Normal work of breathing. Neurologic: No focal deficits.   Lab Results  Component Value Date   CREATININE 1.08 (H) 10/27/2019   BUN 30 (H) 10/27/2019   NA 137 10/27/2019   K 4.6 10/27/2019   CL 100 10/27/2019   CO2 22 10/27/2019   Lab Results  Component Value Date   ALT 14 10/27/2019   AST 22 10/27/2019   ALKPHOS 87 10/27/2019   BILITOT 0.5 10/27/2019   Lab Results  Component Value Date   HGBA1C 5.6 10/27/2019   Lab Results  Component Value Date   INSULIN 11.9 10/27/2019   Lab Results  Component Value Date   TSH 2.080 10/27/2019   Lab Results  Component Value Date   CHOL 176 10/27/2019   HDL 75 10/27/2019   LDLCALC 91 10/27/2019   TRIG 49 10/27/2019   Lab Results  Component Value Date   WBC 4.5 10/27/2019   HGB 12.3 10/27/2019   HCT 37.9 10/27/2019   MCV 95 10/27/2019   PLT 196 10/27/2019   No results found for: IRON, TIBC, FERRITIN  Attestation Statements:   Reviewed by clinician on day of visit: allergies, medications, problem list, medical history, surgical history, family  history, social history, and previous encounter notes.  Time spent on visit including pre-visit chart review and post-visit care and charting was 30 minutes.    I, Trixie Dredge, am acting as transcriptionist for Dennard Nip, MD.  I have reviewed the above documentation for accuracy and completeness, and I agree with the above. -  Dennard Nip, MD

## 2019-12-18 DIAGNOSIS — Z20822 Contact with and (suspected) exposure to covid-19: Secondary | ICD-10-CM | POA: Diagnosis not present

## 2019-12-18 DIAGNOSIS — Z03818 Encounter for observation for suspected exposure to other biological agents ruled out: Secondary | ICD-10-CM | POA: Diagnosis not present

## 2019-12-22 ENCOUNTER — Ambulatory Visit: Payer: Medicare PPO | Admitting: Allergy & Immunology

## 2019-12-22 ENCOUNTER — Telehealth (INDEPENDENT_AMBULATORY_CARE_PROVIDER_SITE_OTHER): Payer: Medicare PPO | Admitting: Psychology

## 2019-12-22 ENCOUNTER — Other Ambulatory Visit: Payer: Self-pay

## 2019-12-22 DIAGNOSIS — F5089 Other specified eating disorder: Secondary | ICD-10-CM | POA: Diagnosis not present

## 2019-12-29 NOTE — Progress Notes (Signed)
Office: [phone]  /  Fax: [phone]    Date: January 12, 2020   Appointment Start Time: 10:00am Duration: 27 minutes Provider: Glennie Isle, Psy.D. Type of Session: Individual Therapy  Location of Patient: Home Location of Provider: Provider's Home Type of Contact: Telepsychological Visit via MyChart Video Visit  Session Content:  Beth Sullivan is a 74 y.o. female presenting for a follow-up appointment to address the previously established treatment goal of increasing coping skills. Today's appointment was a telepsychological visit due to COVID-19. Beth Sullivan provided verbal consent for today's telepsychological appointment and she is aware she is responsible for securing confidentiality on her end of the session. Prior to proceeding with today's appointment, Beth Sullivan's physical location at the time of this appointment was obtained as well a phone number she could be reached at in the event of technical difficulties. Beth Sullivan and this provider participated in today's telepsychological service.   Of note, today's appointment was switched to a regular telephone call at 10:04am with Beth Sullivan's verbal consent due to technical issues on Beth Sullivan's end with audio capabilities.  This provider conducted a brief check-in. Beth Sullivan reported she is "trying to stay focused with this weight loss program." She reported occasional deviations from the meal plan. Triggers for emotional eating were explored. She wonders if it is out of habit eating.This was further explored and it was reflected she is making better choices and engaging in portion control when deviating from the meal plan. Positive reinforcement was provided. Additionally, Beth Sullivan acknowledged sleep issues when she met with Beth Sullivan, but noted an improvement since her CPAP was reset.   Session focused further on mindfulness to assist with coping. She acknowledged she has not engaged in shared exercises. Reviewed benefits and purpose of mindfulness. Beth Sullivan was led  through a mindfulness exercise (A Taste of Mindfulness) and her experience was processed. Beth Sullivan provided verbal consent during today's appointment for this provider to send the handout for today's exercise via e-mail. Notably, this provider discussed her upcoming maternity leave toward the end of November. Beth Sullivan acknowledged understanding given the uncertain nature of the circumstances, this provider may be out of the office sooner. This provider and Beth Sullivan discussed referral options and verbal consent was provided for this provider to send a list of referral options via e-mail. All questions/concerns were addressed. Beth Sullivan denied any concerns. Beth Sullivan was receptive to today's appointment as evidenced by openness to sharing, responsiveness to feedback, and willingness to engage in mindfulness exercises to assist with coping.  Mental Status Examination:  Appearance: well groomed and appropriate hygiene  Behavior: appropriate to circumstances Mood: euthymic Affect: mood congruent Speech: normal in rate, volume, and tone Eye Contact: appropriate Psychomotor Activity: appropriate Gait: unable to assess Thought Process: linear, logical, and goal directed  Thought Content/Perception: no hallucinations, delusions, bizarre thinking or behavior reported or observed and no evidence of suicidal and homicidal ideation, plan, and intent Orientation: time, person, place, and purpose of appointment Memory/Concentration: memory, attention, language, and fund of knowledge intact  Insight/Judgment: good   Interventions:  Conducted a brief chart review Provided empathic reflections and validation Reviewed content from the previous session Provided positive reinforcement Employed supportive psychotherapy interventions to facilitate reduced distress and to improve coping skills with identified stressors Engaged patient in mindfulness exercise(s) Employed acceptance and commitment interventions to emphasize  mindfulness and acceptance without struggle  DSM-5 Diagnosis(es): 307.59 (F50.8) Other Specified Feeding or Eating Disorder, Emotional Eating Behaviors  Treatment Goal & Progress: During the initial appointment with this provider, the following treatment goal was  established: increase coping skills. Beth Sullivan has demonstrated progress in her goal as evidenced by increased awareness of hunger patterns and increased awareness of triggers for emotional eating. Beth Sullivan also continues to demonstrate willingness to engage in learned skill(s).  Plan: The next appointment will be scheduled in two weeks, which will be via MyChart Video Visit. The next session will focus on working towards the established treatment goal and termination planning.

## 2019-12-30 ENCOUNTER — Encounter (INDEPENDENT_AMBULATORY_CARE_PROVIDER_SITE_OTHER): Payer: Self-pay | Admitting: Family Medicine

## 2019-12-30 ENCOUNTER — Ambulatory Visit (INDEPENDENT_AMBULATORY_CARE_PROVIDER_SITE_OTHER): Payer: Medicare PPO | Admitting: Family Medicine

## 2019-12-30 ENCOUNTER — Other Ambulatory Visit: Payer: Self-pay

## 2019-12-30 VITALS — BP 130/63 | HR 72 | Temp 98.0°F | Ht 62.0 in | Wt 202.0 lb

## 2019-12-30 DIAGNOSIS — I10 Essential (primary) hypertension: Secondary | ICD-10-CM

## 2019-12-30 DIAGNOSIS — Z6837 Body mass index (BMI) 37.0-37.9, adult: Secondary | ICD-10-CM

## 2019-12-30 DIAGNOSIS — G4700 Insomnia, unspecified: Secondary | ICD-10-CM | POA: Diagnosis not present

## 2019-12-30 NOTE — Progress Notes (Signed)
Chief Complaint:   OBESITY Beth Sullivan is here to discuss her progress with her obesity treatment plan along with follow-up of her obesity related diagnoses. Beth Sullivan is on the Category 2 Plan and states she is following her eating plan approximately 85% of the time. Beth Sullivan states she is doing 0 minutes 0 times per week.  Today's visit was #: 5 Starting weight: 214 lbs Starting date: 10/27/2019 Today's weight: 202 lbs Today's date: 12/30/2019 Total lbs lost to date: 12 Total lbs lost since last in-office visit: 0  Interim History: Beth Sullivan has had some extra temptations but she did well getting back on track with her eating plan. She sleeps 5-6 hours per night, but she doesn't always wake up refreshed.  Subjective:   1. Essential hypertension Beth Sullivan's blood pressure had been low and she was feeling lightheaded. We discontinued her Clonidine patch 2 weeks ago and her blood pressure ranges 119-130/60-74 with no hypotension.  2. Insomnia, unspecified type Beth Sullivan sleeps 5-6 hours per night, and she has trouble falling asleep and staying asleep.  Assessment/Plan:   1. Essential hypertension Beth Sullivan will continue with diet, exercise, and healthy weight loss to improve blood pressure control. We will watch for signs of hypotension as she continues her lifestyle modifications.   2. Insomnia, unspecified type The problem of recurrent insomnia was discussed. Orders and follow up as documented in patient record. Counseling: Intensive lifestyle modifications are the first line treatment for this issue. We discussed several lifestyle modifications today. Sleep strategies were discussed today. The goal is to add morning exercise and try to increase sleep to 7 hours per night. Beth Sullivan will continue to work on diet and weight loss efforts.   3. Class 2 severe obesity with serious comorbidity and body mass index (BMI) of 37.0 to 37.9 in adult, unspecified obesity type Beth Sullivan) Beth Sullivan is currently in the action stage  of change. As such, her goal is to continue with weight loss efforts. She has agreed to the Category 2 Plan.   Exercise goals: All adults should avoid inactivity. Some physical activity is better than none, and adults who participate in any amount of physical activity gain some health benefits.  Behavioral modification strategies: meal planning and cooking strategies and better snacking choices.  Beth Sullivan has agreed to follow-up with our clinic in 4 weeks. She was informed of the importance of frequent follow-up visits to maximize her success with intensive lifestyle modifications for her multiple health conditions.   Objective:   Blood pressure 130/63, pulse 72, temperature 98 F (36.7 C), height 5\' 2"  (1.575 m), weight 202 lb (91.6 kg), SpO2 99 %. Body mass index is 36.95 kg/m.  General: Cooperative, alert, well developed, in no acute distress. HEENT: Conjunctivae and lids unremarkable. Cardiovascular: Regular rhythm.  Lungs: Normal work of breathing. Neurologic: No focal deficits.   Lab Results  Component Value Date   CREATININE 1.08 (H) 10/27/2019   BUN 30 (H) 10/27/2019   NA 137 10/27/2019   K 4.6 10/27/2019   CL 100 10/27/2019   CO2 22 10/27/2019   Lab Results  Component Value Date   ALT 14 10/27/2019   AST 22 10/27/2019   ALKPHOS 87 10/27/2019   BILITOT 0.5 10/27/2019   Lab Results  Component Value Date   HGBA1C 5.6 10/27/2019   Lab Results  Component Value Date   INSULIN 11.9 10/27/2019   Lab Results  Component Value Date   TSH 2.080 10/27/2019   Lab Results  Component Value Date  CHOL 176 10/27/2019   HDL 75 10/27/2019   LDLCALC 91 10/27/2019   TRIG 49 10/27/2019   Lab Results  Component Value Date   WBC 4.5 10/27/2019   HGB 12.3 10/27/2019   HCT 37.9 10/27/2019   MCV 95 10/27/2019   PLT 196 10/27/2019   No results found for: IRON, TIBC, FERRITIN  Attestation Statements:   Reviewed by clinician on day of visit: allergies, medications, problem  list, medical history, surgical history, family history, social history, and previous encounter notes.  Time spent on visit including pre-visit chart review and post-visit care and charting was 32 minutes.    I, Trixie Dredge, am acting as transcriptionist for Dennard Nip, MD.  I have reviewed the above documentation for accuracy and completeness, and I agree with the above. -  Dennard Nip, MD

## 2019-12-31 ENCOUNTER — Encounter: Payer: Self-pay | Admitting: Allergy & Immunology

## 2019-12-31 ENCOUNTER — Ambulatory Visit: Payer: Medicare PPO | Admitting: Allergy & Immunology

## 2019-12-31 VITALS — BP 124/82 | HR 62 | Temp 97.9°F | Resp 16

## 2019-12-31 DIAGNOSIS — J302 Other seasonal allergic rhinitis: Secondary | ICD-10-CM

## 2019-12-31 DIAGNOSIS — J3089 Other allergic rhinitis: Secondary | ICD-10-CM | POA: Diagnosis not present

## 2019-12-31 NOTE — Progress Notes (Signed)
FOLLOW UP  Date of Service/Encounter:  12/31/19   Assessment:   Chronic rhinitis (grasses, trees, indoor molds, outdoor molds and dust mites)  Fully immunized to COVID19   Plan/Recommendations:   1. Chronic rhinitis (grasses, trees, indoor molds, outdoor molds and dust mites) - I am glad that you are doing so well. - We are not going to make any changes at this time.  - Continue taking: Nasacort (triamcinolone) one spray per nostril daily and Astelin (azelastine) 2 sprays per nostril 1-2 times daily as needed - You seem to have everything under good control. - We can always change medications and/or start allergy shots in the future if needed.   2. Return in about 1 year (around 12/30/2020).   Subjective:   Beth Sullivan is a 74 y.o. female presenting today for follow up of  Chief Complaint  Patient presents with  . Allergic Rhinitis     Beth Sullivan has a history of the following: Patient Active Problem List   Diagnosis Date Noted  . Seasonal and perennial allergic rhinitis 10/13/2019  . Musculoskeletal pain, chronic 10/06/2019  . Leukopenia 10/05/2019  . Abnormal CBC 10/02/2019  . 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 Upper airway cough syndrome 04/30/2018  . Obesity, Class III, BMI 40-49.9 (morbid obesity) ([place]) 10/23/2017  . 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 Essential hypertension 08/26/2016  . Dyspnea on exertion 08/24/2016  . Atypical chest pain 08/24/2016  . Rapid heartbeat 08/24/2016  . Insomnia 05/13/2016  . Acute bronchitis 09/21/2012  . Obstructive sleep apnea 05/29/2012    History obtained from: chart review and patient.  Beth Sullivan is a 75 y.o. female presenting for a follow up visit. She was last seen in June 2021.  At that time, she had testing that was positive to grasses, trees, indoor and outdoor molds, and dust mites. We started her on Nasacort and azelastine. She was not excited about any antihistamines since she takes  so many pills already.   Since the last visit, she has done well. She is on the Nasacort and the Astelin which is working well to control her symptoms. She is doing very well and does not think that she needs allergy shots at this point. She is open to them in the future. She has not needed antibiotics at all since the last visit.   She remains busy. She sleeps in until 8am and then spends a lot of time shopping for deals. She is going to get the booster when it is approved.   Otherwise, there have been no changes to her past medical history, surgical history, family history, or social history.    Review of Systems  Constitutional: Negative.  Negative for fever, malaise/fatigue and weight loss.  HENT: Negative.  Negative for congestion, ear discharge and ear pain.   Eyes: Negative for pain, discharge and redness.  Respiratory: Negative for cough, sputum production, shortness of breath and wheezing.   Cardiovascular: Negative.  Negative for chest pain and palpitations.  Gastrointestinal: Negative for abdominal pain, constipation, diarrhea, heartburn, nausea and vomiting.  Skin: Negative.  Negative for itching and rash.  Neurological: Negative for dizziness and headaches.  Endo/Heme/Allergies: Negative for environmental allergies. Does not bruise/bleed easily.       Objective:   Blood pressure 124/82, pulse 62, temperature 97.9 F (36.6 C), temperature source Temporal, resp. rate 16, SpO2 98 %. There is no height or weight on file to calculate BMI.   Physical Exam:  Physical  Exam Constitutional:      Appearance: She is well-developed.  HENT:     Head: Normocephalic and atraumatic.     Right Ear: Tympanic membrane, ear canal and external ear normal.     Left Ear: Tympanic membrane, ear canal and external ear normal.     Nose: No nasal deformity, septal deviation, mucosal edema or rhinorrhea.     Right Turbinates: Enlarged and swollen.     Left Turbinates: Enlarged and swollen.       Right Sinus: No maxillary sinus tenderness or frontal sinus tenderness.     Left Sinus: No maxillary sinus tenderness or frontal sinus tenderness.     Comments: She does have some scant clear rhinorrhea present. No sinus tenderness.     Mouth/Throat:     Mouth: Mucous membranes are not pale and not dry.     Pharynx: Uvula midline.  Eyes:     General:        Right eye: No discharge.        Left eye: No discharge.     Conjunctiva/sclera: Conjunctivae normal.     Right eye: Right conjunctiva is not injected. No chemosis.    Left eye: Left conjunctiva is not injected. No chemosis.    Pupils: Pupils are equal, round, and reactive to light.  Cardiovascular:     Rate and Rhythm: Normal rate and regular rhythm.     Heart sounds: Normal heart sounds.  Pulmonary:     Effort: Pulmonary effort is normal. No tachypnea, accessory muscle usage or respiratory distress.     Breath sounds: Normal breath sounds. No wheezing, rhonchi or rales.  Chest:     Chest wall: No tenderness.  Lymphadenopathy:     Cervical: No cervical adenopathy.  Skin:    Coloration: Skin is not pale.     Findings: No abrasion, erythema, petechiae or rash. Rash is not papular, urticarial or vesicular.     Comments: No eczematous or urticaria lesions noted.   Neurological:     Mental Status: She is alert.      Diagnostic studies: none     Salvatore Marvel, MD  [facility]

## 2019-12-31 NOTE — Patient Instructions (Addendum)
1. Chronic rhinitis (grasses, trees, indoor molds, outdoor molds and dust mites) - I am glad that you are doing so well. - We are not going to make any changes at this time.  - Continue taking: Nasacort (triamcinolone) one spray per nostril daily and Astelin (azelastine) 2 sprays per nostril 1-2 times daily as needed - You seem to have everything under good control. - We can always change medications and/or start allergy shots in the future if needed.   2. Return in about 1 year (around 12/30/2020).    Please inform us of any Emergency Department visits, hospitalizations, or changes in symptoms. Call us before going to the ED for breathing or allergy symptoms since we might be able to fit you in for a sick visit. Feel free to contact us anytime with any questions, problems, or concerns.  It was a pleasure to see you again today!  Websites that have reliable patient information: 1. American Academy of Asthma, Allergy, and Immunology: www.aaaai.org 2. Food Allergy Research and Education (FARE): foodallergy.org 3. Mothers of Asthmatics: http://www.asthmacommunitynetwork.org 4. American College of Allergy, Asthma, and Immunology: www.acaai.org   COVID-19 Vaccine Information can be found at: ShippingScam.co.uk For questions related to vaccine distribution or appointments, please email vaccine@[facility] .com or call [phone].     "Like" [place] on Facebook and Instagram for our latest updates!       Make sure you are registered to vote! If you have moved or changed any of your contact information, you will need to get this updated before voting!  In some cases, you MAY be able to register to vote online: [email or web address]

## 2020-01-12 ENCOUNTER — Telehealth (INDEPENDENT_AMBULATORY_CARE_PROVIDER_SITE_OTHER): Payer: Medicare PPO | Admitting: Psychology

## 2020-01-12 ENCOUNTER — Other Ambulatory Visit: Payer: Self-pay

## 2020-01-12 ENCOUNTER — Telehealth (INDEPENDENT_AMBULATORY_CARE_PROVIDER_SITE_OTHER): Payer: Self-pay | Admitting: Psychology

## 2020-01-12 DIAGNOSIS — F5089 Other specified eating disorder: Secondary | ICD-10-CM | POA: Diagnosis not present

## 2020-01-12 NOTE — Telephone Encounter (Signed)
°  Office: [phone]  /  Fax: [phone]  Date of Call: January 12, 2020  Time of Call: 8:02am Provider: Glennie Isle, PsyD  CONTENT:  This provider called Beth Sullivan to check-in as she did not present for today's MyChart Video Visit appointment at 8:00am. A HIPAA compliant voicemail was left requesting a call back. Of note, this provider stayed on the MyChart Video Visit appointment for 5 minutes prior to signing off per the clinic's grace period policy.    PLAN: This provider will wait for Lyrika to call back. No further follow-up planned by this provider.

## 2020-01-12 NOTE — Progress Notes (Signed)
Office: [phone]  /  Fax: [phone]    Date: January 26, 2020   Appointment Start Time: 7:59am Duration: 27 minutes Provider: Glennie Isle, Psy.D. Type of Session: Individual Therapy  Location of Patient: Home Location of Provider: Provider's Home Type of Contact: Telepsychological Visit via MyChart Video Visit  Session Content: Beth Sullivan is a 74 y.o. female presenting for a follow-up appointment to address the previously established treatment goal of increasing coping skills. Today's appointment was a telepsychological visit due to COVID-19. Beth Sullivan provided verbal consent for today's telepsychological appointment and she is aware she is responsible for securing confidentiality on her end of the session. Prior to proceeding with today's appointment, Beth Sullivan's physical location at the time of this appointment was obtained as well a phone number she could be reached at in the event of technical difficulties. Beth Sullivan and this provider participated in today's telepsychological service. Of note, today's appointment was switched to a regular telephone call at 8:02am with Beth Sullivan's verbal consent due to audio issues on her end.   This provider conducted a brief check-in. Beth Sullivan described an improvement in eating habits, noting, "I'm not overindulging." Session focused further on mindfulness to assist with coping. Beth Sullivan stated she has been engaging in shared exercises. Positive reinforcement was provided. Psychoeducation regarding formal (e.g., setting aside a specific time daily to engage in an exercise) and informal (e.g., cultivating awareness in the present moment and taking a non-judgmental approach while engaging in day-to-day tasks) mindfulness was provided. Beth Sullivan was led through a mindfulness breathing exercise and her experience was processed. Beth Sullivan provided verbal consent during today's appointment for this provider to send the handout for today's exercise via e-mail. This provider also discussed the  utilization of YouTube for mindfulness exercises (e.g., exercises by Merri Ray). Furthermore, termination planning was discussed. Beth Sullivan was receptive to a follow-up appointment in 3-4 weeks and an additional follow-up/termination appointment in 3-4 weeks after that. Beth Sullivan was receptive to today's appointment as evidenced by openness to sharing, responsiveness to feedback, and willingness to continue engaging in mindfulness exercises.  Mental Status Examination:  Appearance: well groomed and appropriate hygiene  Behavior: appropriate to circumstances Mood: euthymic Affect: mood congruent Speech: normal in rate, volume, and tone Eye Contact: appropriate Psychomotor Activity: appropriate Gait: unable to assess Thought Process: linear, logical, and goal directed  Thought Content/Perception: no hallucinations, delusions, bizarre thinking or behavior reported or observed and no evidence of suicidal and homicidal ideation, plan, and intent Orientation: time, person, place, and purpose of appointment Memory/Concentration: memory, attention, language, and fund of knowledge intact  Insight/Judgment: good   Interventions:  Conducted a brief chart review Provided empathic reflections and validation Reviewed content from the previous session Provided positive reinforcement Employed supportive psychotherapy interventions to facilitate reduced distress and to improve coping skills with identified stressors Engaged patient in mindfulness exercise(s) Employed acceptance and commitment interventions to emphasize mindfulness and acceptance without struggle Discussed termination planning  DSM-5 Diagnosis(es): 307.59 (F50.8) Other Specified Feeding or Eating Disorder, Emotional Eating Behaviors  Treatment Goal & Progress: During the initial appointment with this provider, the following treatment goal was established: increase coping skills. Beth Sullivan has demonstrated progress in her goal as evidenced by  increased awareness of hunger patterns, increased awareness of triggers for emotional eating and reduction in emotional eating. Beth Sullivan also continues to demonstrate willingness to engage in learned skill(s).  Plan: The next appointment will be scheduled in 3-4 weeks, which will be via MyChart Video Visit. The next session will focus on working towards the established  treatment goal.

## 2020-01-17 ENCOUNTER — Encounter: Payer: Self-pay | Admitting: Internal Medicine

## 2020-01-17 DIAGNOSIS — U071 COVID-19: Secondary | ICD-10-CM | POA: Insufficient documentation

## 2020-01-17 NOTE — Assessment & Plan Note (Signed)
Benefits from CPAP, with breakthrough centrals. Plan- try full face mask, refer for mask fitting, change to auto 10-20

## 2020-01-17 NOTE — Assessment & Plan Note (Signed)
Watching. No post covid concerns at this time.

## 2020-01-17 NOTE — Assessment & Plan Note (Signed)
Losty weight after bariatric surgery, gaining some back, still obese.

## 2020-01-21 DIAGNOSIS — L739 Follicular disorder, unspecified: Secondary | ICD-10-CM | POA: Diagnosis not present

## 2020-01-21 DIAGNOSIS — L669 Cicatricial alopecia, unspecified: Secondary | ICD-10-CM | POA: Diagnosis not present

## 2020-01-21 DIAGNOSIS — L658 Other specified nonscarring hair loss: Secondary | ICD-10-CM | POA: Diagnosis not present

## 2020-01-26 ENCOUNTER — Telehealth (INDEPENDENT_AMBULATORY_CARE_PROVIDER_SITE_OTHER): Payer: Medicare PPO | Admitting: Psychology

## 2020-01-26 DIAGNOSIS — F5089 Other specified eating disorder: Secondary | ICD-10-CM | POA: Diagnosis not present

## 2020-01-27 ENCOUNTER — Other Ambulatory Visit: Payer: Self-pay

## 2020-01-27 ENCOUNTER — Ambulatory Visit (INDEPENDENT_AMBULATORY_CARE_PROVIDER_SITE_OTHER): Payer: Medicare PPO | Admitting: Family Medicine

## 2020-01-27 ENCOUNTER — Encounter (INDEPENDENT_AMBULATORY_CARE_PROVIDER_SITE_OTHER): Payer: Self-pay | Admitting: Family Medicine

## 2020-01-27 VITALS — BP 107/68 | HR 66 | Temp 97.8°F | Ht 62.0 in | Wt 201.0 lb

## 2020-01-27 DIAGNOSIS — Z6836 Body mass index (BMI) 36.0-36.9, adult: Secondary | ICD-10-CM | POA: Diagnosis not present

## 2020-01-27 DIAGNOSIS — I10 Essential (primary) hypertension: Secondary | ICD-10-CM | POA: Diagnosis not present

## 2020-02-02 NOTE — Progress Notes (Unsigned)
Office: [phone]  /  Fax: [phone]    Date: February 16, 2020   Appointment Start Time: *** Duration: *** minutes Provider: Glennie Isle, Psy.D. Type of Session: Individual Therapy  Location of Patient: {gbptloc:23249} Location of Provider: Provider's Home Type of Contact: Telepsychological Visit via MyChart Video Visit  Session Content: Beth Sullivan is a 74 y.o. female presenting for a follow-up appointment to address the previously established treatment goal of increasing coping skills. Today's appointment was a telepsychological visit due to COVID-19. Beth Sullivan provided verbal consent for today's telepsychological appointment and she is aware she is responsible for securing confidentiality on her end of the session. Prior to proceeding with today's appointment, Beth Sullivan physical location at the time of this appointment was obtained as well a phone number she could be reached at in the event of technical difficulties. Beth Sullivan and this provider participated in today's telepsychological service.   This provider conducted a brief check-in and verbally administered the PHQ-9 and GAD-7. *** Beth Sullivan was receptive to today's appointment as evidenced by openness to sharing, responsiveness to feedback, and {gbreceptiveness:23401}.  Mental Status Examination:  Appearance: {Appearance:22431} Behavior: {Behavior:22445} Mood: {gbmood:21757} Affect: {Affect:22436} Speech: {Speech:22432} Eye Contact: {Eye Contact:22433} Psychomotor Activity: {Motor Activity:22434} Gait: {gbgait:23404} Thought Process: {thought process:22448}  Thought Content/Perception: {disturbances:22451} Orientation: {Orientation:22437} Memory/Concentration: {gbcognition:22449} Insight/Judgment: {Insight:22446}  Structured Assessments Results: The Patient Health Questionnaire-9 (PHQ-9) is a self-report measure that assesses symptoms and severity of depression over the course of the last two weeks. Beth Sullivan obtained a score of ***  suggesting {GBPHQ9SEVERITY:21752}. Beth Sullivan finds the endorsed symptoms to be {gbphq9difficulty:21754}. [0= Not at all; 1= Several days; 2= More than half the days; 3= Nearly every day] Little interest or pleasure in doing things ***  Feeling down, depressed, or hopeless ***  Trouble falling or staying asleep, or sleeping too much ***  Feeling tired or having little energy ***  Poor appetite or overeating ***  Feeling bad about yourself --- or that you are a failure or have let yourself or your family down ***  Trouble concentrating on things, such as reading the newspaper or watching television ***  Moving or speaking so slowly that other people could have noticed? Or the opposite --- being so fidgety or restless that you have been moving around a lot more than usual ***  Thoughts that you would be better off dead or hurting yourself in some way ***  PHQ-9 Score ***    The Generalized Anxiety Disorder-7 (GAD-7) is a brief self-report measure that assesses symptoms of anxiety over the course of the last two weeks. Beth Sullivan obtained a score of *** suggesting {gbgad7severity:21753}. Beth Sullivan finds the endorsed symptoms to be {gbphq9difficulty:21754}. [0= Not at all; 1= Several days; 2= Over half the days; 3= Nearly every day] Feeling nervous, anxious, on edge ***  Not being able to stop or control worrying ***  Worrying too much about different things ***  Trouble relaxing ***  Being so restless that it's hard to sit still ***  Becoming easily annoyed or irritable ***  Feeling afraid as if something awful might happen ***  GAD-7 Score ***   Interventions:  {Interventions for Progress Notes:23405}  DSM-5 Diagnosis(es): 307.59 (F50.8) Other Specified Feeding or Eating Disorder, Emotional Eating Behaviors  Treatment Goal & Progress: During the initial appointment with this provider, the following treatment goal was established: increase coping skills. Beth Sullivan has demonstrated progress in her goal as  evidenced by {gbtxprogress:22839}. Beth Sullivan also {gbtxprogress2:22951}.  Plan: The next appointment will be scheduled in {gbweeks:21758}, which  will be {gbtxmodality:23402}. The next session will focus on {Plan for Next Appointment:23400}.

## 2020-02-03 DIAGNOSIS — Z6838 Body mass index (BMI) 38.0-38.9, adult: Secondary | ICD-10-CM | POA: Diagnosis not present

## 2020-02-03 DIAGNOSIS — Z008 Encounter for other general examination: Secondary | ICD-10-CM | POA: Diagnosis not present

## 2020-02-03 DIAGNOSIS — E785 Hyperlipidemia, unspecified: Secondary | ICD-10-CM | POA: Diagnosis not present

## 2020-02-03 DIAGNOSIS — L659 Nonscarring hair loss, unspecified: Secondary | ICD-10-CM | POA: Diagnosis not present

## 2020-02-03 DIAGNOSIS — I10 Essential (primary) hypertension: Secondary | ICD-10-CM | POA: Diagnosis not present

## 2020-02-03 DIAGNOSIS — Z0000 Encounter for general adult medical examination without abnormal findings: Secondary | ICD-10-CM | POA: Diagnosis not present

## 2020-02-08 NOTE — Progress Notes (Signed)
Chief Complaint:   OBESITY Beth Sullivan is here to discuss her progress with her obesity treatment plan along with follow-up of her obesity related diagnoses. Beth Sullivan is on the Category 3 Plan and states she is following her eating plan approximately 90-95% of the time. Beth Sullivan states she is doing cardio for 10-15 minutes 4 times per week.  Today's visit was #: 6 Starting weight: 214 lbs Starting date: 10/27/2019 Today's weight: 201 lbs Today's date: 01/27/2020 Total lbs lost to date: 13 Total lbs lost since last in-office visit: 0  Interim History: Beth Sullivan continues to do well with weight loss on her plan. Her hunger is mostly controlled and she notes her sweet cravings have improved overall.  Subjective:   1. Essential hypertension Beth Sullivan's blood pressure is well controlled off her beta blocker. She denies lightheadedness or dizziness. She is doing well with diet and weight loss.  Assessment/Plan:   1. Essential hypertension Beth Sullivan will continue increasing her water intake, and will continue healthy weight loss, diet, and exercise to improve blood pressure control. We will watch for signs of hypotension as she continues her lifestyle modifications.   2. Class 2 severe obesity with serious comorbidity and body mass index (BMI) of 36.0 to 36.9 in adult, unspecified obesity type Beth Sullivan) Beth Sullivan is currently in the action stage of change. As such, her goal is to continue with weight loss efforts. She has agreed to the Category 2 Plan.   We will recheck fasting labs at her next visit.  Exercise goals: As is, exercise band exercises were discussed.  Behavioral modification strategies: increasing lean protein intake.  Beth Sullivan has agreed to follow-up with our clinic in 3 to 4 weeks. She was informed of the importance of frequent follow-up visits to maximize her success with intensive lifestyle modifications for her multiple health conditions.   Objective:   Blood pressure 107/68, pulse 66,  temperature 97.8 F (36.6 C), height 5\' 2"  (1.575 m), weight 201 lb (91.2 kg), SpO2 98 %. Body mass index is 36.76 kg/m.  General: Cooperative, alert, well developed, in no acute distress. HEENT: Conjunctivae and lids unremarkable. Cardiovascular: Regular rhythm.  Lungs: Normal work of breathing. Neurologic: No focal deficits.   Lab Results  Component Value Date   CREATININE 1.08 (H) 10/27/2019   BUN 30 (H) 10/27/2019   NA 137 10/27/2019   K 4.6 10/27/2019   CL 100 10/27/2019   CO2 22 10/27/2019   Lab Results  Component Value Date   ALT 14 10/27/2019   AST 22 10/27/2019   ALKPHOS 87 10/27/2019   BILITOT 0.5 10/27/2019   Lab Results  Component Value Date   HGBA1C 5.6 10/27/2019   Lab Results  Component Value Date   INSULIN 11.9 10/27/2019   Lab Results  Component Value Date   TSH 2.080 10/27/2019   Lab Results  Component Value Date   CHOL 176 10/27/2019   HDL 75 10/27/2019   LDLCALC 91 10/27/2019   TRIG 49 10/27/2019   Lab Results  Component Value Date   WBC 4.5 10/27/2019   HGB 12.3 10/27/2019   HCT 37.9 10/27/2019   MCV 95 10/27/2019   PLT 196 10/27/2019   No results found for: IRON, TIBC, FERRITIN  Attestation Statements:   Reviewed by clinician on day of visit: allergies, medications, problem list, medical history, surgical history, family history, social history, and previous encounter notes.  Time spent on visit including pre-visit chart review and post-visit care and charting was 30 minutes.  I, Trixie Dredge, am acting as transcriptionist for Dennard Nip, MD.  I have reviewed the above documentation for accuracy and completeness, and I agree with the above. -  Dennard Nip, MD

## 2020-02-11 DIAGNOSIS — R238 Other skin changes: Secondary | ICD-10-CM | POA: Diagnosis not present

## 2020-02-11 DIAGNOSIS — M25551 Pain in right hip: Secondary | ICD-10-CM | POA: Diagnosis not present

## 2020-02-11 DIAGNOSIS — R350 Frequency of micturition: Secondary | ICD-10-CM | POA: Diagnosis not present

## 2020-02-15 DIAGNOSIS — R262 Difficulty in walking, not elsewhere classified: Secondary | ICD-10-CM | POA: Diagnosis not present

## 2020-02-15 DIAGNOSIS — M25551 Pain in right hip: Secondary | ICD-10-CM | POA: Diagnosis not present

## 2020-02-16 ENCOUNTER — Telehealth (INDEPENDENT_AMBULATORY_CARE_PROVIDER_SITE_OTHER): Payer: Self-pay | Admitting: Psychology

## 2020-02-16 DIAGNOSIS — R262 Difficulty in walking, not elsewhere classified: Secondary | ICD-10-CM | POA: Diagnosis not present

## 2020-02-17 ENCOUNTER — Ambulatory Visit (INDEPENDENT_AMBULATORY_CARE_PROVIDER_SITE_OTHER): Payer: Medicare PPO | Admitting: Family Medicine

## 2020-02-22 DIAGNOSIS — M5416 Radiculopathy, lumbar region: Secondary | ICD-10-CM | POA: Diagnosis not present

## 2020-02-23 ENCOUNTER — Other Ambulatory Visit: Payer: Self-pay | Admitting: Sports Medicine

## 2020-02-23 DIAGNOSIS — M5416 Radiculopathy, lumbar region: Secondary | ICD-10-CM

## 2020-03-07 DIAGNOSIS — M25551 Pain in right hip: Secondary | ICD-10-CM | POA: Diagnosis not present

## 2020-03-12 ENCOUNTER — Other Ambulatory Visit: Payer: Self-pay

## 2020-03-12 ENCOUNTER — Ambulatory Visit
Admission: RE | Admit: 2020-03-12 | Discharge: 2020-03-12 | Disposition: A | Discharge status: Home / Self Care | Payer: Medicare PPO | Source: Ambulatory Visit | Attending: Sports Medicine | Admitting: Sports Medicine

## 2020-03-12 DIAGNOSIS — M48061 Spinal stenosis, lumbar region without neurogenic claudication: Secondary | ICD-10-CM | POA: Diagnosis not present

## 2020-03-12 DIAGNOSIS — M5416 Radiculopathy, lumbar region: Secondary | ICD-10-CM

## 2020-03-12 DIAGNOSIS — M545 Low back pain, unspecified: Secondary | ICD-10-CM | POA: Diagnosis not present

## 2020-03-21 DIAGNOSIS — Z1389 Encounter for screening for other disorder: Secondary | ICD-10-CM | POA: Diagnosis not present

## 2020-03-21 DIAGNOSIS — Z0000 Encounter for general adult medical examination without abnormal findings: Secondary | ICD-10-CM | POA: Diagnosis not present

## 2020-03-21 DIAGNOSIS — M5416 Radiculopathy, lumbar region: Secondary | ICD-10-CM | POA: Diagnosis not present

## 2020-03-23 DIAGNOSIS — M5416 Radiculopathy, lumbar region: Secondary | ICD-10-CM | POA: Diagnosis not present

## 2020-03-28 DIAGNOSIS — M48061 Spinal stenosis, lumbar region without neurogenic claudication: Secondary | ICD-10-CM | POA: Diagnosis not present

## 2020-03-28 DIAGNOSIS — I10 Essential (primary) hypertension: Secondary | ICD-10-CM | POA: Diagnosis not present

## 2020-03-28 DIAGNOSIS — G4730 Sleep apnea, unspecified: Secondary | ICD-10-CM | POA: Diagnosis not present

## 2020-03-28 DIAGNOSIS — E785 Hyperlipidemia, unspecified: Secondary | ICD-10-CM | POA: Diagnosis not present

## 2020-03-28 DIAGNOSIS — E559 Vitamin D deficiency, unspecified: Secondary | ICD-10-CM | POA: Diagnosis not present

## 2020-03-29 DIAGNOSIS — M5416 Radiculopathy, lumbar region: Secondary | ICD-10-CM | POA: Diagnosis not present

## 2020-03-31 DIAGNOSIS — M5416 Radiculopathy, lumbar region: Secondary | ICD-10-CM | POA: Diagnosis not present

## 2020-04-05 DIAGNOSIS — M792 Neuralgia and neuritis, unspecified: Secondary | ICD-10-CM | POA: Diagnosis not present

## 2020-04-05 DIAGNOSIS — G4733 Obstructive sleep apnea (adult) (pediatric): Secondary | ICD-10-CM | POA: Diagnosis not present

## 2020-04-05 DIAGNOSIS — M47816 Spondylosis without myelopathy or radiculopathy, lumbar region: Secondary | ICD-10-CM | POA: Diagnosis not present

## 2020-04-05 DIAGNOSIS — M5416 Radiculopathy, lumbar region: Secondary | ICD-10-CM | POA: Diagnosis not present

## 2020-04-06 DIAGNOSIS — M25551 Pain in right hip: Secondary | ICD-10-CM | POA: Diagnosis not present

## 2020-04-07 DIAGNOSIS — M5416 Radiculopathy, lumbar region: Secondary | ICD-10-CM | POA: Diagnosis not present

## 2020-04-20 ENCOUNTER — Encounter (INDEPENDENT_AMBULATORY_CARE_PROVIDER_SITE_OTHER): Payer: Self-pay | Admitting: Family Medicine

## 2020-04-20 ENCOUNTER — Other Ambulatory Visit: Payer: Self-pay | Admitting: Family Medicine

## 2020-04-20 DIAGNOSIS — Z1231 Encounter for screening mammogram for malignant neoplasm of breast: Secondary | ICD-10-CM

## 2020-06-02 ENCOUNTER — Ambulatory Visit
Admission: RE | Admit: 2020-06-02 | Discharge: 2020-06-02 | Disposition: A | Discharge status: Home / Self Care | Payer: Medicare PPO | Source: Ambulatory Visit | Attending: Family Medicine | Admitting: Family Medicine

## 2020-06-02 ENCOUNTER — Other Ambulatory Visit: Payer: Self-pay

## 2020-06-02 DIAGNOSIS — Z1231 Encounter for screening mammogram for malignant neoplasm of breast: Secondary | ICD-10-CM

## 2020-06-06 ENCOUNTER — Other Ambulatory Visit: Payer: Self-pay

## 2020-06-06 ENCOUNTER — Encounter (HOSPITAL_COMMUNITY): Payer: Self-pay | Admitting: Emergency Medicine

## 2020-06-06 DIAGNOSIS — Z8616 Personal history of COVID-19: Secondary | ICD-10-CM | POA: Diagnosis not present

## 2020-06-06 DIAGNOSIS — N182 Chronic kidney disease, stage 2 (mild): Secondary | ICD-10-CM | POA: Diagnosis not present

## 2020-06-06 DIAGNOSIS — R1013 Epigastric pain: Secondary | ICD-10-CM | POA: Insufficient documentation

## 2020-06-06 DIAGNOSIS — Z7982 Long term (current) use of aspirin: Secondary | ICD-10-CM | POA: Insufficient documentation

## 2020-06-06 DIAGNOSIS — R197 Diarrhea, unspecified: Secondary | ICD-10-CM | POA: Insufficient documentation

## 2020-06-06 DIAGNOSIS — Z86010 Personal history of colonic polyps: Secondary | ICD-10-CM | POA: Diagnosis not present

## 2020-06-06 DIAGNOSIS — I129 Hypertensive chronic kidney disease with stage 1 through stage 4 chronic kidney disease, or unspecified chronic kidney disease: Secondary | ICD-10-CM | POA: Diagnosis not present

## 2020-06-06 DIAGNOSIS — Z79899 Other long term (current) drug therapy: Secondary | ICD-10-CM | POA: Insufficient documentation

## 2020-06-06 LAB — COMPREHENSIVE METABOLIC PANEL
ALT: 25 U/L (ref 0–44)
AST: 24 U/L (ref 15–41)
Albumin: 4.3 g/dL (ref 3.5–5.0)
Alkaline Phosphatase: 58 U/L (ref 38–126)
Anion gap: 12 (ref 5–15)
BUN: 36 mg/dL — ABNORMAL HIGH (ref 8–23)
CO2: 23 mmol/L (ref 22–32)
Calcium: 9.5 mg/dL (ref 8.9–10.3)
Chloride: 101 mmol/L (ref 98–111)
Creatinine, Ser: 0.96 mg/dL (ref 0.44–1.00)
GFR, Estimated: >60 mL/min (ref >60)
Glucose, Bld: 105 mg/dL — ABNORMAL HIGH (ref 70–99)
Potassium: 4.2 mmol/L (ref 3.5–5.1)
Sodium: 136 mmol/L (ref 135–145)
Total Bilirubin: 1.1 mg/dL (ref 0.3–1.2)
Total Protein: 8 g/dL (ref 6.5–8.1)

## 2020-06-06 LAB — CBC
HCT: 44.8 % (ref 36.0–46.0)
Hemoglobin: 14.7 g/dL (ref 12.0–15.0)
MCH: 32 pg (ref 26.0–34.0)
MCHC: 32.8 g/dL (ref 30.0–36.0)
MCV: 97.4 fL (ref 80.0–100.0)
Platelets: 185 10*3/uL (ref 150–400)
RBC: 4.6 MIL/uL (ref 3.87–5.11)
RDW: 13.2 % (ref 11.5–15.5)
WBC: 8.2 10*3/uL (ref 4.0–10.5)
nRBC: 0 % (ref 0.0–0.2)

## 2020-06-06 LAB — LIPASE, BLOOD: Lipase: 49 U/L (ref 11–51)

## 2020-06-06 NOTE — ED Triage Notes (Addendum)
Pt c/o epigastric pain x 1 hour and diarrhea that began earlier this AM. Pain does not radiate, describes it as constant and sharp. Also endorses feeling dizzy and hot at onset of pain and nausea. Denies emesis occurrence. Pt states her pain has almost subsided at this time.

## 2020-06-07 ENCOUNTER — Emergency Department (HOSPITAL_COMMUNITY)
Admission: EM | Admit: 2020-06-07 | Discharge: 2020-06-07 | Disposition: A | Discharge status: Home / Self Care | Payer: Medicare PPO | Attending: Emergency Medicine | Admitting: Emergency Medicine

## 2020-06-07 DIAGNOSIS — R109 Unspecified abdominal pain: Secondary | ICD-10-CM

## 2020-06-07 DIAGNOSIS — R197 Diarrhea, unspecified: Secondary | ICD-10-CM

## 2020-06-07 LAB — URINALYSIS, ROUTINE W REFLEX MICROSCOPIC
Bacteria, UA: NONE SEEN
Bilirubin Urine: NEGATIVE
Glucose, UA: NEGATIVE mg/dL
Ketones, ur: 5 mg/dL — AB
Leukocytes,Ua: NEGATIVE
Nitrite: NEGATIVE
Protein, ur: NEGATIVE mg/dL
Specific Gravity, Urine: 1.019 (ref 1.005–1.030)
pH: 5 (ref 5.0–8.0)

## 2020-06-07 MED ORDER — ONDANSETRON HCL 4 MG PO TABS: 4.0000 mg | ORAL_TABLET | Freq: Three times a day (TID) | ORAL | 0 refills | Status: DC | PRN

## 2020-06-07 NOTE — ED Provider Notes (Signed)
[facility] DEPT Provider Note   CSN: [id number] Arrival date & time: 06/06/20  1823     History Chief Complaint  Patient presents with  . Abdominal Pain  . Diarrhea    Beth Sullivan is a 75 y.o. female.   Abdominal Pain Pain location:  Epigastric Pain quality: aching and sharp   Pain radiates to:  Does not radiate Pain severity:  Mild Duration:  2 hours Timing:  Constant Progression:  Resolved Chronicity:  New Context: not alcohol use, not eating, not previous surgeries, not recent illness, not retching, not sick contacts, not suspicious food intake and not trauma   Associated symptoms: diarrhea   Diarrhea Associated symptoms: abdominal pain        Past Medical History:  Diagnosis Date  . Allergic rhinitis   . Back pain   . Chest pain   . Chronic headaches    "ice pick headaches"  . Chronic pain disorder    From long-standing cervical spine disease - chronic pain management, history of  . CKD (chronic kidney disease), stage II    Per PCP notes - Cr 0.9-1.3  . Constipation   . Depression   . GERD (gastroesophageal reflux disease)   . Hair loss   . History of colon polyps 02/2014   Dr. Lizbeth Bark  . Hyperlipemia   . Hypertension   . Joint pain   . Lactose intolerance   . Lower extremity edema   . Morbid obesity with BMI of 45.0-49.9, adult ([place])   . Palpitations   . Pre-diabetes   . Sleep apnea    uses a cpap - Dr. Annamaria Boots  . Swallowing difficulty   . Vitamin D deficiency     Patient Active Problem List   Diagnosis Date Noted  . COVID-19 virus infection 01/17/2020  . Seasonal and perennial allergic rhinitis 10/13/2019  . Musculoskeletal pain, chronic 10/06/2019  . Leukopenia 10/05/2019  . Abnormal CBC 10/02/2019  . 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 Upper airway cough syndrome 04/30/2018  . Obesity, Class III, BMI 40-49.9 (morbid obesity) ([place]) 10/23/2017  . 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 Essential hypertension 08/26/2016  . Dyspnea on exertion 08/24/2016  . Atypical chest pain 08/24/2016  . Rapid heartbeat 08/24/2016  . Insomnia 05/13/2016  . Acute bronchitis 09/21/2012  . Obstructive sleep apnea 05/29/2012    Past Surgical History:  Procedure Laterality Date  . ABDOMINAL HYSTERECTOMY  1975   BSO  . BREAST EXCISIONAL BIOPSY Left 05/12/2013   papilloma  . BREAST EXCISIONAL BIOPSY Left 03/30/2013   SCLEROSING DUCTAL PAPILLOMA  . BREAST LUMPECTOMY WITH NEEDLE LOCALIZATION Left 05/12/2013   Procedure: BREAST LUMPECTOMY WITH NEEDLE LOCALIZATION;  Surgeon: Marcello Moores A. Cornett, MD;  Location: [facility];  Service: General;  Laterality: Left;  . CESAREAN SECTION  1970  . COLONOSCOPY    . EYE SURGERY  2008   both cataracts  . FOOT SURGERY  08-2011   Left  . HAND SURGERY Right   . LAPAROSCOPIC GASTRIC SLEEVE RESECTION N/A 04/30/2017   Procedure: LAPAROSCOPIC GASTRIC SLEEVE RESECTION WITH UPPER ENDO;  Surgeon: Johnathan Hausen, MD;  Location: [facility];  Service: General;  Laterality: N/A;  . right CTS    . TUBAL LIGATION    . VESICOVAGINAL FISTULA CLOSURE W/ TAH    . vocal cord surgery  07/2017     OB History    Gravida  1   Para  Term      Preterm      AB      Living  1     SAB      IAB      Ectopic      Multiple      Live Births              Family History  Problem Relation Age of Onset  . Heart disease Mother        Unaware of specifics  . Hypertension Mother   . Diabetes Mother   . Heart attack Mother   . Kidney disease Mother   . Diabetes Father   . Hypertension Father   . Kidney disease Father   . Prostate cancer Brother   . Prostate cancer Brother   . Hypertension Sister   . Heart disease Sister        Unaware of specifics  . Heart disease Brother        Our specifics  . Colon cancer Maternal Grandmother   . Migraines Brother   . Breast cancer Neg Hx     Social History   Tobacco Use  .  Smoking status: Never Smoker  . Smokeless tobacco: Never Used  Vaping Use  . Vaping Use: Never used  Substance Use Topics  . Alcohol use: No  . Drug use: No    Home Medications Prior to Admission medications   Medication Sig Start Date End Date Taking? Authorizing Provider  ondansetron (ZOFRAN) 4 MG tablet Take 1 tablet (4 mg total) by mouth every 8 (eight) hours as needed for nausea or vomiting. 06/07/20  Yes Kylani Wires, Corene Cornea, MD  aspirin EC 81 MG tablet Take 81 mg by mouth at bedtime.    [provider]  atorvastatin (LIPITOR) 10 MG tablet Take 10 mg by mouth at bedtime.     [provider]  azelastine (ASTELIN) 0.1 % nasal spray Place 2 sprays into both nostrils 2 (two) times daily. 10/14/19   Valentina Shaggy, MD  Calcium Carbonate (CALCIUM 500 PO) Take 500 mg by mouth every other day.    [provider]  chlorthalidone (HYGROTON) 25 MG tablet Take 25 mg by mouth daily.    [provider]  Cholecalciferol (VITAMIN D3 PO) Take 5,000 Int'l Units by mouth every other day.    [provider]  finasteride (PROSCAR) 5 MG tablet Take 2.5 mg by mouth daily.    [provider]  losartan (COZAAR) 25 MG tablet Take 25 mg by mouth daily.    [provider]  Multiple Vitamin (MULTIVITAMIN WITH MINERALS) TABS tablet Take 1 tablet by mouth daily.    [provider]  spironolactone (ALDACTONE) 25 MG tablet Take 25 mg by mouth daily.    [provider]  triamcinolone (NASACORT ALLERGY 24HR) 55 MCG/ACT AERO nasal inhaler Place 2 sprays into the nose daily.    [provider]  valACYclovir (VALTREX) 1000 MG tablet Take 500 mg by mouth 2 (two) times daily.    [provider]  verapamil (COVERA HS) 240 MG (CO) 24 hr tablet Take 240 mg by mouth 2 (two) times daily.     [provider]  vitamin C (ASCORBIC ACID) 500 MG tablet Take 500 mg by mouth daily.    [provider]    Allergies     Tramadol  Review of Systems   Review of Systems  Gastrointestinal: Positive for abdominal pain and diarrhea.  All other systems  reviewed and are negative.   Physical Exam Updated Vital Signs BP 140/79 (BP Location: Right Arm)   Pulse 88   Temp 98.1 F (36.7 C) (Oral)   Resp 16   Ht 5\' 2"  (1.575 m)   Wt 95.3 kg   SpO2 98%   BMI 38.41 kg/m   Physical Exam Vitals and nursing note reviewed.  Constitutional:      Appearance: She is well-developed and well-nourished.  HENT:     Head: Normocephalic and atraumatic.     Nose: Nose normal.     Mouth/Throat:     Mouth: Mucous membranes are moist.     Pharynx: Oropharynx is clear.  Eyes:     Pupils: Pupils are equal, round, and reactive to light.  Cardiovascular:     Rate and Rhythm: Normal rate and regular rhythm.  Pulmonary:     Effort: No respiratory distress.     Breath sounds: No stridor.  Abdominal:     General: Abdomen is flat. There is no distension.     Palpations: There is no mass.     Tenderness: There is no abdominal tenderness. There is no guarding or rebound.     Hernia: No hernia is present.  Musculoskeletal:        General: No swelling or tenderness. Normal range of motion.     Cervical back: Normal range of motion.  Skin:    General: Skin is warm and dry.     Coloration: Skin is not jaundiced or pale.  Neurological:     General: No focal deficit present.     Mental Status: She is alert.     ED Results / Procedures / Treatments   Labs (all labs ordered are listed, but only abnormal results are displayed) Labs Reviewed  COMPREHENSIVE METABOLIC PANEL - Abnormal; Notable for the following components:      Result Value   Glucose, Bld 105 (*)    BUN 36 (*)    All other components within normal limits  URINALYSIS, ROUTINE W REFLEX MICROSCOPIC - Abnormal; Notable for the following components:   Hgb urine dipstick SMALL (*)    Ketones, ur 5 (*)    All other components within normal limits  LIPASE,  BLOOD  CBC    EKG None  Radiology No results found.  Procedures Procedures   Medications Ordered in ED Medications - No data to display  ED Course  I have reviewed the triage vital signs and the nursing notes.  Pertinent labs & imaging results that were available during my care of the patient were reviewed by me and considered in my medical decision making (see chart for details).    MDM Rules/Calculators/A&P                          Diarrhea with nausea. Improved in ER. Epigastric pain as well and improved. ecg ok. Labs ok. Here for >8 hours without symptoms. Abdomen benign. Doubt severe intraabdominal process at this time.   Final Clinical Impression(s) / ED Diagnoses Final diagnoses:  Diarrhea, unspecified type  Abdominal pain, unspecified abdominal location    Rx / DC Orders ED Discharge Orders         Ordered    ondansetron (ZOFRAN) 4 MG tablet  Every 8 hours PRN        06/07/20 0421           Giamarie Bueche, Corene Cornea, MD 06/07/20 4680

## 2020-06-21 DIAGNOSIS — M47816 Spondylosis without myelopathy or radiculopathy, lumbar region: Secondary | ICD-10-CM | POA: Diagnosis not present

## 2020-06-21 DIAGNOSIS — M5416 Radiculopathy, lumbar region: Secondary | ICD-10-CM | POA: Diagnosis not present

## 2020-07-05 DIAGNOSIS — M47816 Spondylosis without myelopathy or radiculopathy, lumbar region: Secondary | ICD-10-CM | POA: Diagnosis not present

## 2020-07-06 DIAGNOSIS — G4733 Obstructive sleep apnea (adult) (pediatric): Secondary | ICD-10-CM | POA: Diagnosis not present

## 2020-08-09 DIAGNOSIS — E785 Hyperlipidemia, unspecified: Secondary | ICD-10-CM | POA: Diagnosis not present

## 2020-08-09 DIAGNOSIS — E559 Vitamin D deficiency, unspecified: Secondary | ICD-10-CM | POA: Diagnosis not present

## 2020-08-09 DIAGNOSIS — I10 Essential (primary) hypertension: Secondary | ICD-10-CM | POA: Diagnosis not present

## 2020-08-09 DIAGNOSIS — G4730 Sleep apnea, unspecified: Secondary | ICD-10-CM | POA: Diagnosis not present

## 2020-08-09 DIAGNOSIS — R000 Tachycardia, unspecified: Secondary | ICD-10-CM | POA: Diagnosis not present

## 2020-08-09 DIAGNOSIS — M48061 Spinal stenosis, lumbar region without neurogenic claudication: Secondary | ICD-10-CM | POA: Diagnosis not present

## 2020-08-11 DIAGNOSIS — R000 Tachycardia, unspecified: Secondary | ICD-10-CM | POA: Diagnosis not present

## 2020-08-11 DIAGNOSIS — E559 Vitamin D deficiency, unspecified: Secondary | ICD-10-CM | POA: Diagnosis not present

## 2020-08-11 DIAGNOSIS — E785 Hyperlipidemia, unspecified: Secondary | ICD-10-CM | POA: Diagnosis not present

## 2020-08-24 NOTE — Progress Notes (Addendum)
Date:  08/25/2020   ID:  Beth Sullivan, DOB Apr 17, 1945, MRN [id number]  PCP:  Beth Shanks, MD  Cardiologist:  Beth Kras, DO, Beth Sullivan (established care 08/25/2020)  REASON FOR CONSULT: Rapid heart rate  REQUESTING PHYSICIAN:  Beth Shanks, MD [place],  [place] [address]  Chief Complaint  Patient presents with  . Palpitations    HPI  Beth Sullivan is a 75 y.o. female who presents to the office with a chief complaint of "palpitations." Patient's past medical history and cardiovascular risk factors include: COVID-19 infection, aortic atherosclerosis, prediabetes, hypertension, sleep apnea on CPAP, hyperlipidemia, vitamin D deficiency, postmenopausal female, advanced age.  She is referred to the office at the request of Beth Shanks, MD for evaluation of rapid heart rate.  Patient presents to the office for evaluation of palpitations.  She has been experiencing it for the last couple months; however, the intensity, frequency, duration has not changed.  They occur at least once a week and last for a few minutes in duration.  Her last episode was on Aug 19, 2020.  She feels that her heart is racing and at times associated with lightheaded and near syncope.  No improving or worsening factors.  The symptoms are usually self resolved.  No known thyroid disease or history of anemia.  No new over-the-counter medications, herbal supplements, stimulant medications, weight loss supplements, alcohol or illicit consumption, no soda consumption.  She drinks 1 cup of decaffeinated coffee daily.  Has been on verapamil for several years.  Family history of premature coronary artery disease.  Mom had her first myocardial infarction in her mid 91s.  Sister had a heart attack about the age of 66.  FUNCTIONAL STATUS: Water aerobics 3 times a week.  ALLERGIES: Allergies  Allergen Reactions  . Hydrocodone Itching  . Tramadol Nausea And Vomiting  . Tramadol Hcl Nausea And Vomiting     MEDICATION LIST PRIOR TO VISIT: Current Meds  Medication Sig  . aspirin EC 81 MG tablet Take 81 mg by mouth at bedtime.  Marland Kitchen atorvastatin (LIPITOR) 10 MG tablet Take 10 mg by mouth at bedtime.   Marland Kitchen azelastine (ASTELIN) 0.1 % nasal spray Place 2 sprays into both nostrils 2 (two) times daily.  . Calcium Carbonate (CALCIUM 500 PO) Take 500 mg by mouth every other day.  . chlorthalidone (HYGROTON) 25 MG tablet Take 25 mg by mouth daily.  . Cholecalciferol (VITAMIN D3 PO) Take 5,000 Int'l Units by mouth every other day.  . finasteride (PROSCAR) 5 MG tablet Take 2.5 mg by mouth daily.  Marland Kitchen gabapentin (NEURONTIN) 300 MG capsule Take 1 capsule by mouth 3 (three) times daily.  Marland Kitchen losartan (COZAAR) 25 MG tablet Take 25 mg by mouth daily.  . Multiple Vitamin (MULTIVITAMIN WITH MINERALS) TABS tablet Take 1 tablet by mouth daily.  Marland Kitchen spironolactone (ALDACTONE) 25 MG tablet Take 25 mg by mouth daily.  Marland Kitchen triamcinolone (NASACORT) 55 MCG/ACT AERO nasal inhaler Place 2 sprays into the nose daily.  . valACYclovir (VALTREX) 1000 MG tablet Take 500 mg by mouth 2 (two) times daily.  . verapamil (COVERA HS) 240 MG (CO) 24 hr tablet Take 240 mg by mouth 2 (two) times daily.   . vitamin C (ASCORBIC ACID) 500 MG tablet Take 500 mg by mouth daily.  . [DISCONTINUED] valsartan (DIOVAN) 320 MG tablet Take by mouth.     PAST MEDICAL HISTORY: Past Medical History:  Diagnosis Date  . Allergic rhinitis   .  Back pain   . Chest pain   . Chronic headaches    "ice pick headaches"  . Chronic pain disorder    From long-standing cervical spine disease - chronic pain management, history of  . CKD (chronic kidney disease), stage II    Per PCP notes - Cr 0.9-1.3  . Constipation   . Depression   . GERD (gastroesophageal reflux disease)   . Hair loss   . History of colon polyps 02/2014   Dr. Lizbeth Bark  . Hyperlipemia   . Hypertension   . Joint pain   . Lactose intolerance   . Lower extremity edema   . Morbid obesity  with BMI of 45.0-49.9, adult ([place])   . Palpitations   . Pre-diabetes   . Sleep apnea    uses a cpap - Dr. Annamaria Boots  . Swallowing difficulty   . Vitamin D deficiency     PAST SURGICAL HISTORY: Past Surgical History:  Procedure Laterality Date  . ABDOMINAL HYSTERECTOMY  1975   BSO  . BREAST EXCISIONAL BIOPSY Left 05/12/2013   papilloma  . BREAST EXCISIONAL BIOPSY Left 03/30/2013   SCLEROSING DUCTAL PAPILLOMA  . BREAST LUMPECTOMY WITH NEEDLE LOCALIZATION Left 05/12/2013   Procedure: BREAST LUMPECTOMY WITH NEEDLE LOCALIZATION;  Surgeon: Marcello Moores A. Cornett, MD;  Location: [facility];  Service: General;  Laterality: Left;  . CESAREAN SECTION  1970  . COLONOSCOPY    . EYE SURGERY  2008   both cataracts  . FOOT SURGERY  08-2011   Left  . HAND SURGERY Right   . LAPAROSCOPIC GASTRIC SLEEVE RESECTION N/A 04/30/2017   Procedure: LAPAROSCOPIC GASTRIC SLEEVE RESECTION WITH UPPER ENDO;  Surgeon: Johnathan Hausen, MD;  Location: [facility];  Service: General;  Laterality: N/A;  . right CTS    . TUBAL LIGATION    . VESICOVAGINAL FISTULA CLOSURE W/ TAH    . vocal cord surgery  07/2017    FAMILY HISTORY: The patient family history includes Colon cancer in her maternal grandmother; Diabetes in her father and mother; Heart attack in her mother; Heart disease in her brother, mother, and sister; Hypertension in her father, mother, and sister; Kidney disease in her father and mother; Migraines in her brother; Prostate cancer in her brother and brother.  SOCIAL HISTORY:  The patient  reports that she has never smoked. She has never used smokeless tobacco. She reports that she does not drink alcohol and does not use drugs.  REVIEW OF SYSTEMS: Review of Systems  Constitutional: Negative for chills and fever.  HENT: Negative for hoarse voice and nosebleeds.   Eyes: Negative for discharge, double vision and pain.  Cardiovascular: Positive for palpitations. Negative for chest pain, claudication,  dyspnea on exertion, leg swelling, near-syncope, orthopnea, paroxysmal nocturnal dyspnea and syncope.  Respiratory: Negative for hemoptysis and shortness of breath.   Musculoskeletal: Negative for muscle cramps and myalgias.  Gastrointestinal: Negative for abdominal pain, constipation, diarrhea, hematemesis, hematochezia, melena, nausea and vomiting.  Neurological: Negative for dizziness and light-headedness.    PHYSICAL EXAM: Vitals with BMI 08/25/2020 06/07/2020 06/06/2020  Height 5' 2" - -  Weight 219 lbs - -  BMI 49.44 - -  Systolic 967 591 638  Diastolic 68 79 68  Pulse 71 88 100   Orthostatic VS for the past 72 hrs (Last 3 readings):  Orthostatic BP Patient Position BP Location Cuff Size Orthostatic Pulse  08/25/20 1238 109/66 Standing Left Arm Large 65  08/25/20 1237 123/65 Sitting Left Arm Large 61  08/25/20 1236 126/70 Supine Left Arm Large 67   CONSTITUTIONAL: Well-developed and well-nourished. No acute distress.  SKIN: Skin is warm and dry. No rash noted. No cyanosis. No pallor. No jaundice HEAD: Normocephalic and atraumatic.  EYES: No scleral icterus, arcus senilis bilaterally MOUTH/THROAT: Moist oral membranes.  NECK: No JVD present. No thyromegaly noted. Left carotid bruits  LYMPHATIC: No visible cervical adenopathy.  CHEST Normal respiratory effort. No intercostal retractions  LUNGS: Clear to auscultation bilaterally. No stridor. No wheezes. No rales.  CARDIOVASCULAR: Regular rate and rhythm, positive S1-S2, no murmurs rubs or gallops appreciated. ABDOMINAL: Obese, soft, nontender, nondistended, positive bowel sounds all 4 quadrants. No apparent ascites.  EXTREMITIES: No peripheral edema, very faint posterior tibial pulses bilaterally, +1 left dorsalis pedis, right DP not appreciated HEMATOLOGIC: No significant bruising NEUROLOGIC: Oriented to person, place, and time. Nonfocal. Normal muscle tone.  PSYCHIATRIC: Normal mood and affect. Normal behavior.  Cooperative  CARDIAC DATABASE: EKG: 08/25/2020: Normal sinus rhythm, 74 bpm, normal axis, nonspecific T wave abnormality.    Echocardiogram: 09/11/2016: LVEF 60-65%, mild LVH, grade 1 diastolic impairment, mild MR, normal right ventricular size and function, PASP 29 mmHg.   Stress Testing: No results found for this or any previous visit from the past 1095 days.  Heart Catheterization: None   LABORATORY DATA: CBC Latest Ref Rng & Units 06/06/2020 10/27/2019 10/05/2019  WBC 4.0 - 10.5 K/uL 8.2 4.5 5.4  Hemoglobin 12.0 - 15.0 g/dL 14.7 12.3 12.0  Hematocrit 36.0 - 46.0 % 44.8 37.9 37.2  Platelets 150 - 400 K/uL 185 196 190    CMP Latest Ref Rng & Units 06/06/2020 10/27/2019 06/25/2019  Glucose 70 - 99 mg/dL 105(H) 81 81  BUN 8 - 23 mg/dL 36(H) 30(H) 32(H)  Creatinine 0.44 - 1.00 mg/dL 0.96 1.08(H) 1.15(H)  Sodium 135 - 145 mmol/L 136 137 140  Potassium 3.5 - 5.1 mmol/L 4.2 4.6 4.7  Chloride 98 - 111 mmol/L 101 100 102  CO2 22 - 32 mmol/L _0 Calcium 8.9 - 10.3 mg/dL 9.5 10.0 9.9  Total Protein 6.5 - 8.1 g/dL 8.0 7.7 -  Total Bilirubin 0.3 - 1.2 mg/dL 1.1 0.5 -  Alkaline Phos 38 - 126 U/L 58 87 -  AST 15 - 41 U/L 24 22 -  ALT 0 - 44 U/L 25 14 -    Lipid Panel     Component Value Date/Time   CHOL 176 10/27/2019 1334   TRIG 49 10/27/2019 1334   HDL 75 10/27/2019 1334   LDLCALC 91 10/27/2019 1334   LABVLDL 10 10/27/2019 1334    No components found for: NTPROBNP No results for input(s): PROBNP in the last 8760 hours. Recent Labs    10/27/19 1334  TSH 2.080    BMP Recent Labs    10/27/19 1334 06/06/20 2147  NA 137 136  K 4.6 4.2  CL 100 101  CO2 22 23  GLUCOSE 81 105*  BUN 30* 36*  CREATININE 1.08* 0.96  CALCIUM 10.0 9.5  GFRNONAA 51* >60  GFRAA 59*  --     HEMOGLOBIN A1C Lab Results  Component Value Date   HGBA1C 5.6 10/27/2019    IMPRESSION:    ICD-10-CM   1. Palpitations  R00.2 EKG 12-Lead    LONG TERM MONITOR (3-14 DAYS)  2. Dyspnea on  exertion  R06.00 PCV ECHOCARDIOGRAM COMPLETE  3. Essential hypertension  I10   4. Atherosclerosis of aorta (HCC)  I70.0   5. Prediabetes  R73.03  6. Obstructive sleep apnea on CPAP  G47.33    Z99.89   7. Mixed hyperlipidemia  E78.2   8. Family history of premature CAD  Z82.49   9. Left carotid bruit  R09.89 PCV CAROTID DUPLEX (BILATERAL)  10. Dizziness  R42 PCV CAROTID DUPLEX (BILATERAL)     RECOMMENDATIONS: PIPPA HANIF is a 75 y.o. female whose past medical history and cardiac risk factors include:  COVID-19 infection, aortic atherosclerosis, prediabetes, hypertension, sleep apnea on CPAP, hyperlipidemia, vitamin D deficiency, postmenopausal female, advanced age.  Palpitations:  Patient symptoms of palpitation have been present for several months.  No episodes of syncope.  She is currently on verapamil but continues to have brief infrequent episodes as discussed above.  Labs were recently performed by PCP, requested during today's office visit.  Pending  Orthostatic vital signs negative  Recommend 14-day extended Holter monitor to evaluate for dysrhythmias.  EKG shows normal sinus rhythm without ectopy.  Dyspnea on exertion:  Patient is currently on both chlorthalidone and spironolactone.  Overall euvolemic.  Blood pressures are very well controlled and suspect they may be overcorrected.  Echocardiogram will be ordered to evaluate for structural heart disease and left ventricular systolic function.  Monitor for now  Lightheaded and dizziness:  Patient experiences lightheaded and dizziness when she has episodes of palpitations.  On clinical examination she also has a left carotid bruit and multiple cardiovascular risk factors and therefore recommended a carotid duplex for further evaluation.  Left carotid bruit: Check carotid duplex  Benign essential hypertension.    Office blood pressures well controlled.  Patient has been experiencing lightheaded and  dizziness and I suspect that she may be overcorrected with her blood pressures.  Orthostatic vital signs negative.  If symptoms continue, consider decreasing one of the diuretic medications.  Will defer to primary team at this time.  Currently managed by primary care provider.  Hyperlipidemia:  Currently on statin therapy.  Does not endorse myalgias.  Currently managed by primary care provider.   FINAL MEDICATION LIST END OF ENCOUNTER: No orders of the defined types were placed in this encounter.   Medications Discontinued During This Encounter  Medication Reason  . Multiple Vitamin (MULTI-VITAMIN) tablet Error  . ondansetron (ZOFRAN) 4 MG tablet Error  . valsartan (DIOVAN) 320 MG tablet Patient Preference     Current Outpatient Medications:  .  aspirin EC 81 MG tablet, Take 81 mg by mouth at bedtime., Disp: , Rfl:  .  atorvastatin (LIPITOR) 10 MG tablet, Take 10 mg by mouth at bedtime. , Disp: , Rfl:  .  azelastine (ASTELIN) 0.1 % nasal spray, Place 2 sprays into both nostrils 2 (two) times daily., Disp: 30 mL, Rfl: 5 .  Calcium Carbonate (CALCIUM 500 PO), Take 500 mg by mouth every other day., Disp: , Rfl:  .  chlorthalidone (HYGROTON) 25 MG tablet, Take 25 mg by mouth daily., Disp: , Rfl:  .  Cholecalciferol (VITAMIN D3 PO), Take 5,000 Int'l Units by mouth every other day., Disp: , Rfl:  .  finasteride (PROSCAR) 5 MG tablet, Take 2.5 mg by mouth daily., Disp: , Rfl:  .  gabapentin (NEURONTIN) 300 MG capsule, Take 1 capsule by mouth 3 (three) times daily., Disp: , Rfl:  .  losartan (COZAAR) 25 MG tablet, Take 25 mg by mouth daily., Disp: , Rfl:  .  Multiple Vitamin (MULTIVITAMIN WITH MINERALS) TABS tablet, Take 1 tablet by mouth daily., Disp: , Rfl:  .  spironolactone (ALDACTONE) 25 MG tablet, Take  25 mg by mouth daily., Disp: , Rfl:  .  triamcinolone (NASACORT) 55 MCG/ACT AERO nasal inhaler, Place 2 sprays into the nose daily., Disp: , Rfl:  .  valACYclovir (VALTREX) 1000  MG tablet, Take 500 mg by mouth 2 (two) times daily., Disp: , Rfl:  .  verapamil (COVERA HS) 240 MG (CO) 24 hr tablet, Take 240 mg by mouth 2 (two) times daily. , Disp: , Rfl:  .  vitamin C (ASCORBIC ACID) 500 MG tablet, Take 500 mg by mouth daily., Disp: , Rfl:   Orders Placed This Encounter  Procedures  . LONG TERM MONITOR (3-14 DAYS)  . EKG 12-Lead  . PCV ECHOCARDIOGRAM COMPLETE  . PCV CAROTID DUPLEX (BILATERAL)    There are no Patient Instructions on file for this visit.   --Continue cardiac medications as reconciled in final medication list. --Return in about 5 weeks (around 09/29/2020) for Follow up, Palpitations, Review test results. Or sooner if needed. --Continue follow-up with your primary care physician regarding the management of your other chronic comorbid conditions.  Patient's questions and concerns were addressed to her satisfaction. She voices understanding of the instructions provided during this encounter.   This note was created using a voice recognition software as a result there may be grammatical errors inadvertently enclosed that do not reflect the nature of this encounter. Every attempt is made to correct such errors.  Mechele [facility]  Pager: [phone] Office: [phone]  ADDENDUM: Outside labs received after the office visit. External Labs:  Collected: 08/11/2020 provided by PCP office Hemoglobin 12.3 g/dL, hematocrit 36.9% Platelets 188,000 Creatinine 0.94 mg/dL. eGFR: 64 mL/min per 1.73 m Lipid profile: Total cholesterol 184, triglycerides 81, HDL 73, LDL 96, non-HDL 111  Mechele Claude [facility]  Pager: [phone] Office: [phone]

## 2020-08-25 ENCOUNTER — Other Ambulatory Visit: Payer: Self-pay

## 2020-08-25 ENCOUNTER — Encounter: Payer: Self-pay | Admitting: Cardiology

## 2020-08-25 ENCOUNTER — Ambulatory Visit: Payer: Medicare PPO | Admitting: Cardiology

## 2020-08-25 VITALS — BP 123/68 | HR 71 | Temp 98.0°F | Resp 17 | Ht 62.0 in | Wt 219.0 lb

## 2020-08-25 DIAGNOSIS — R002 Palpitations: Secondary | ICD-10-CM | POA: Diagnosis not present

## 2020-08-25 DIAGNOSIS — E782 Mixed hyperlipidemia: Secondary | ICD-10-CM | POA: Diagnosis not present

## 2020-08-25 DIAGNOSIS — I700 Atherosclerosis of aorta: Secondary | ICD-10-CM | POA: Diagnosis not present

## 2020-08-25 DIAGNOSIS — G4733 Obstructive sleep apnea (adult) (pediatric): Secondary | ICD-10-CM

## 2020-08-25 DIAGNOSIS — R0600 Dyspnea, unspecified: Secondary | ICD-10-CM

## 2020-08-25 DIAGNOSIS — R7303 Prediabetes: Secondary | ICD-10-CM | POA: Diagnosis not present

## 2020-08-25 DIAGNOSIS — R42 Dizziness and giddiness: Secondary | ICD-10-CM

## 2020-08-25 DIAGNOSIS — R0989 Other specified symptoms and signs involving the circulatory and respiratory systems: Secondary | ICD-10-CM

## 2020-08-25 DIAGNOSIS — Z8249 Family history of ischemic heart disease and other diseases of the circulatory system: Secondary | ICD-10-CM | POA: Diagnosis not present

## 2020-08-25 DIAGNOSIS — Z9989 Dependence on other enabling machines and devices: Secondary | ICD-10-CM

## 2020-08-25 DIAGNOSIS — I10 Essential (primary) hypertension: Secondary | ICD-10-CM

## 2020-08-25 DIAGNOSIS — R0609 Other forms of dyspnea: Secondary | ICD-10-CM

## 2020-08-30 DIAGNOSIS — M25551 Pain in right hip: Secondary | ICD-10-CM | POA: Diagnosis not present

## 2020-09-01 ENCOUNTER — Inpatient Hospital Stay: Payer: Medicare PPO

## 2020-09-01 ENCOUNTER — Other Ambulatory Visit: Payer: Self-pay

## 2020-09-01 ENCOUNTER — Ambulatory Visit: Payer: Medicare PPO

## 2020-09-01 DIAGNOSIS — R42 Dizziness and giddiness: Secondary | ICD-10-CM

## 2020-09-01 DIAGNOSIS — R0989 Other specified symptoms and signs involving the circulatory and respiratory systems: Secondary | ICD-10-CM

## 2020-09-01 DIAGNOSIS — R002 Palpitations: Secondary | ICD-10-CM | POA: Diagnosis not present

## 2020-09-01 DIAGNOSIS — R0609 Other forms of dyspnea: Secondary | ICD-10-CM | POA: Diagnosis not present

## 2020-09-01 DIAGNOSIS — R0600 Dyspnea, unspecified: Secondary | ICD-10-CM

## 2020-09-06 ENCOUNTER — Other Ambulatory Visit: Payer: Self-pay | Admitting: Cardiology

## 2020-09-06 DIAGNOSIS — I6521 Occlusion and stenosis of right carotid artery: Secondary | ICD-10-CM

## 2020-09-06 NOTE — Progress Notes (Signed)
Spoke to patient she voiced understanding and spoke with [facility] regarding CT

## 2020-09-12 DIAGNOSIS — R8290 Unspecified abnormal findings in urine: Secondary | ICD-10-CM | POA: Diagnosis not present

## 2020-09-12 DIAGNOSIS — R509 Fever, unspecified: Secondary | ICD-10-CM | POA: Diagnosis not present

## 2020-09-12 DIAGNOSIS — H109 Unspecified conjunctivitis: Secondary | ICD-10-CM | POA: Diagnosis not present

## 2020-09-12 DIAGNOSIS — R519 Headache, unspecified: Secondary | ICD-10-CM | POA: Diagnosis not present

## 2020-09-12 DIAGNOSIS — N3001 Acute cystitis with hematuria: Secondary | ICD-10-CM | POA: Diagnosis not present

## 2020-09-12 DIAGNOSIS — J069 Acute upper respiratory infection, unspecified: Secondary | ICD-10-CM | POA: Diagnosis not present

## 2020-09-12 DIAGNOSIS — U071 COVID-19: Secondary | ICD-10-CM | POA: Diagnosis not present

## 2020-09-14 DIAGNOSIS — U071 COVID-19: Secondary | ICD-10-CM | POA: Diagnosis not present

## 2020-09-14 DIAGNOSIS — Z6839 Body mass index (BMI) 39.0-39.9, adult: Secondary | ICD-10-CM | POA: Diagnosis not present

## 2020-09-19 NOTE — Progress Notes (Signed)
Patient is scheduled for 6/21

## 2020-09-22 DIAGNOSIS — M25512 Pain in left shoulder: Secondary | ICD-10-CM | POA: Diagnosis not present

## 2020-09-22 DIAGNOSIS — R002 Palpitations: Secondary | ICD-10-CM | POA: Diagnosis not present

## 2020-09-24 DIAGNOSIS — R002 Palpitations: Secondary | ICD-10-CM | POA: Diagnosis not present

## 2020-09-26 NOTE — Progress Notes (Signed)
Send this message to the front in order for them to reschedule pt f/u appt

## 2020-09-26 NOTE — Progress Notes (Signed)
Called pt to inform her about her monitor results. Pt understood

## 2020-09-30 DIAGNOSIS — E785 Hyperlipidemia, unspecified: Secondary | ICD-10-CM | POA: Diagnosis not present

## 2020-09-30 DIAGNOSIS — E559 Vitamin D deficiency, unspecified: Secondary | ICD-10-CM | POA: Diagnosis not present

## 2020-09-30 DIAGNOSIS — R000 Tachycardia, unspecified: Secondary | ICD-10-CM | POA: Diagnosis not present

## 2020-09-30 DIAGNOSIS — I10 Essential (primary) hypertension: Secondary | ICD-10-CM | POA: Diagnosis not present

## 2020-09-30 DIAGNOSIS — M48061 Spinal stenosis, lumbar region without neurogenic claudication: Secondary | ICD-10-CM | POA: Diagnosis not present

## 2020-09-30 DIAGNOSIS — G4730 Sleep apnea, unspecified: Secondary | ICD-10-CM | POA: Diagnosis not present

## 2020-09-30 DIAGNOSIS — M25512 Pain in left shoulder: Secondary | ICD-10-CM | POA: Diagnosis not present

## 2020-10-03 ENCOUNTER — Ambulatory Visit: Payer: Medicare PPO | Admitting: Cardiology

## 2020-10-04 ENCOUNTER — Other Ambulatory Visit: Payer: Self-pay

## 2020-10-04 ENCOUNTER — Ambulatory Visit
Admission: RE | Admit: 2020-10-04 | Discharge: 2020-10-04 | Disposition: A | Discharge status: Home / Self Care | Payer: Medicare PPO | Source: Ambulatory Visit | Attending: Cardiology | Admitting: Cardiology

## 2020-10-04 DIAGNOSIS — I6521 Occlusion and stenosis of right carotid artery: Secondary | ICD-10-CM

## 2020-10-04 DIAGNOSIS — M2669 Other specified disorders of temporomandibular joint: Secondary | ICD-10-CM | POA: Diagnosis not present

## 2020-10-04 DIAGNOSIS — M25512 Pain in left shoulder: Secondary | ICD-10-CM | POA: Diagnosis not present

## 2020-10-04 DIAGNOSIS — G4733 Obstructive sleep apnea (adult) (pediatric): Secondary | ICD-10-CM | POA: Diagnosis not present

## 2020-10-04 DIAGNOSIS — I6523 Occlusion and stenosis of bilateral carotid arteries: Secondary | ICD-10-CM | POA: Diagnosis not present

## 2020-10-04 DIAGNOSIS — R42 Dizziness and giddiness: Secondary | ICD-10-CM | POA: Diagnosis not present

## 2020-10-04 MED ORDER — IOPAMIDOL (ISOVUE-370) INJECTION 76%
75.0000 mL | Freq: Once | INTRAVENOUS | Status: AC | PRN
  Administered 2020-10-04: 75 mL via INTRAVENOUS

## 2020-10-05 DIAGNOSIS — M47816 Spondylosis without myelopathy or radiculopathy, lumbar region: Secondary | ICD-10-CM | POA: Diagnosis not present

## 2020-10-05 DIAGNOSIS — M7061 Trochanteric bursitis, right hip: Secondary | ICD-10-CM | POA: Diagnosis not present

## 2020-10-15 DIAGNOSIS — M2550 Pain in unspecified joint: Secondary | ICD-10-CM | POA: Diagnosis not present

## 2020-10-19 DIAGNOSIS — M25512 Pain in left shoulder: Secondary | ICD-10-CM | POA: Diagnosis not present

## 2020-10-19 NOTE — Progress Notes (Signed)
Date:  10/20/2020   ID:  Beth Sullivan, DOB Aug 09, 1945, MRN [id number]  PCP:  Vernie Shanks, MD  Cardiologist:  Rex Kras, DO, [facility] (established care 08/25/2020)  Date: 10/20/20 Last Office Visit: 08/25/2020  Chief Complaint  Patient presents with   Palpitations   Results   Follow-up    8 weeks      HPI  Beth Sullivan is a 75 y.o. female who presents to the office with a chief complaint of " reevaluation of palpitations/rapid heart rate and discuss test results." Patient's past medical history and cardiovascular risk factors include: COVID-19 infection, aortic atherosclerosis, prediabetes, hypertension, sleep apnea on CPAP, hyperlipidemia, vitamin D deficiency, postmenopausal female, advanced age.  She is referred to the office at the request of Vernie Shanks, MD for evaluation of rapid heart rate.  Patient was initially referred to the office for evaluation of a rapid heart rate.  Please see the initial consult for additional details on history of present illness.  However the shared decision was to proceed with a 14-day extended Holter monitor.  Patient was noted to have no significant dysrhythmias on the Holter monitor and results were reviewed with her in great detail and noted below for further reference.  At the last office visit patient was also complaining of being lightheaded and dizzy my assumption was it was most likely secondary to being on 2 diuretic therapies.  On physical examination she was also noted to have a left carotid bruit and therefore underwent a carotid duplex.  The carotid duplex was concerning for significant hemodynamic carotid artery stenosis and therefore CTA of the head and neck were performed which notes bilateral patent carotid arteries.  After discussing with her PCP they did discontinue 1 diuretic therapy which has led to resolution of her symptoms of lightheaded and dizziness.  She is now asymptomatic.  Given her shortness of breath with effort  related activities and echocardiogram was performed which notes preserved LVEF, no regional wall motion abnormalities, and mild/moderate valvular heart disease.  Overall asymptomatic.  Since last office encounter no hospitalizations or urgent care visits for cardiovascular symptoms.  Family history of premature coronary artery disease.  Mom had her first myocardial infarction in her mid 30s.  Sister had a heart attack about the age of 48.  FUNCTIONAL STATUS: Water aerobics 3 times a week.  ALLERGIES: Allergies  Allergen Reactions   Hydrocodone Itching   Tramadol Nausea And Vomiting   Tramadol Hcl Nausea And Vomiting    MEDICATION LIST PRIOR TO VISIT: Current Meds  Medication Sig   aspirin EC 81 MG tablet Take 81 mg by mouth at bedtime.   atorvastatin (LIPITOR) 10 MG tablet Take 10 mg by mouth at bedtime.    Calcium Carbonate (CALCIUM 500 PO) Take 500 mg by mouth every other day.   chlorthalidone (HYGROTON) 25 MG tablet Take 25 mg by mouth daily.   Cholecalciferol (VITAMIN D3 PO) Take 5,000 Int'l Units by mouth every other day.   finasteride (PROSCAR) 5 MG tablet Take 2.5 mg by mouth daily.   losartan (COZAAR) 25 MG tablet Take 25 mg by mouth daily.   Multiple Vitamin (MULTIVITAMIN WITH MINERALS) TABS tablet Take 1 tablet by mouth daily.   spironolactone (ALDACTONE) 25 MG tablet Take 25 mg by mouth daily.   valACYclovir (VALTREX) 1000 MG tablet Take 500 mg by mouth 2 (two) times daily.   verapamil (COVERA HS) 240 MG (CO) 24 hr tablet Take 240 mg by mouth 2 (  two) times daily.    vitamin C (ASCORBIC ACID) 500 MG tablet Take 500 mg by mouth daily.     PAST MEDICAL HISTORY: Past Medical History:  Diagnosis Date   Allergic rhinitis    Back pain    Chest pain    Chronic headaches    "ice pick headaches"   Chronic pain disorder    From long-standing cervical spine disease - chronic pain management, history of   CKD (chronic kidney disease), stage II    Per PCP notes - Cr 0.9-1.3    Constipation    Depression    GERD (gastroesophageal reflux disease)    Hair loss    History of colon polyps 02/2014   Dr. Lizbeth Bark   Hyperlipemia    Hypertension    Joint pain    Lactose intolerance    Lower extremity edema    Morbid obesity with BMI of 45.0-49.9, adult (HCC)    Palpitations    Pre-diabetes    Sleep apnea    uses a cpap - Dr. Annamaria Boots   Swallowing difficulty    Vitamin D deficiency     PAST SURGICAL HISTORY: Past Surgical History:  Procedure Laterality Date   ABDOMINAL HYSTERECTOMY  1975   BSO   BREAST EXCISIONAL BIOPSY Left 05/12/2013   papilloma   BREAST EXCISIONAL BIOPSY Left 03/30/2013   SCLEROSING DUCTAL PAPILLOMA   BREAST LUMPECTOMY WITH NEEDLE LOCALIZATION Left 05/12/2013   Procedure: BREAST LUMPECTOMY WITH NEEDLE LOCALIZATION;  Surgeon: Marcello Moores A. Cornett, MD;  Location: [place];  Service: General;  Laterality: Left;   Esto  2008   both cataracts   FOOT SURGERY  08-2011   Left   HAND SURGERY Right    LAPAROSCOPIC GASTRIC SLEEVE RESECTION N/A 04/30/2017   Procedure: LAPAROSCOPIC GASTRIC SLEEVE RESECTION WITH UPPER ENDO;  Surgeon: Johnathan Hausen, MD;  Location: [facility];  Service: General;  Laterality: N/A;   right CTS     TUBAL LIGATION     VESICOVAGINAL FISTULA CLOSURE W/ TAH     vocal cord surgery  07/2017    FAMILY HISTORY: The patient family history includes Colon cancer in her maternal grandmother; Diabetes in her father and mother; Heart attack in her mother; Heart disease in her brother, mother, and sister; Hypertension in her father, mother, and sister; Kidney disease in her father and mother; Migraines in her brother; Prostate cancer in her brother and brother.  SOCIAL HISTORY:  The patient  reports that she has never smoked. She has never used smokeless tobacco. She reports that she does not drink alcohol and does not use drugs.  REVIEW OF SYSTEMS: Review of Systems   Constitutional: Negative for chills and fever.  HENT:  Negative for hoarse voice and nosebleeds.   Eyes:  Negative for discharge, double vision and pain.  Cardiovascular:  Negative for chest pain, claudication, dyspnea on exertion, leg swelling, near-syncope, orthopnea, palpitations, paroxysmal nocturnal dyspnea and syncope.  Respiratory:  Negative for hemoptysis and shortness of breath.   Musculoskeletal:  Negative for muscle cramps and myalgias.  Gastrointestinal:  Negative for abdominal pain, constipation, diarrhea, hematemesis, hematochezia, melena, nausea and vomiting.  Neurological:  Negative for dizziness and light-headedness.   PHYSICAL EXAM: Vitals with BMI 10/20/2020 08/25/2020 06/07/2020  Height 5' 2" 5' 2" -  Weight 216 lbs 219 lbs -  BMI 23.9 53.20 -  Systolic 233 435 686  Diastolic 76 68 79  Pulse 68 71 88    CONSTITUTIONAL: Well-developed and well-nourished. No acute distress.  SKIN: Skin is warm and dry. No rash noted. No cyanosis. No pallor. No jaundice HEAD: Normocephalic and atraumatic.  EYES: No scleral icterus, arcus senilis bilaterally MOUTH/THROAT: Moist oral membranes.  NECK: No JVD present. No thyromegaly noted. Left carotid bruit.  LYMPHATIC: No visible cervical adenopathy.  CHEST Normal respiratory effort. No intercostal retractions  LUNGS: Clear to auscultation bilaterally. No stridor. No wheezes. No rales.  CARDIOVASCULAR: Regular rate and rhythm, positive S1-S2, no murmurs rubs or gallops appreciated. ABDOMINAL: Obese, soft, nontender, nondistended, positive bowel sounds all 4 quadrants. No apparent ascites.  EXTREMITIES: No peripheral edema, very faint posterior tibial pulses bilaterally, +1 left dorsalis pedis, right DP not appreciated HEMATOLOGIC: No significant bruising NEUROLOGIC: Oriented to person, place, and time. Nonfocal. Normal muscle tone.  PSYCHIATRIC: Normal mood and affect. Normal behavior. Cooperative  CARDIAC DATABASE: EKG: 08/25/2020:  Normal sinus rhythm, 74 bpm, normal axis, nonspecific T wave abnormality.    Echocardiogram: 09/01/2020:  Left ventricle cavity is normal in size. Mild concentric hypertrophy of the left ventricle. Normal global wall motion. Normal LV systolic function with EF 55%. Doppler evidence of grade I (impaired) diastolic dysfunction, normal LAP.  Aneurysmal interatrial septum without 2D or color Doppler evidence of shunting. Mild to moderate mitral regurgitation. Moderate tricuspid regurgitation.  No evidence of pulmonary hypertension.   Stress Testing: No results found for this or any previous visit from the past 1095 days.  Heart Catheterization: None   14 day extended Holter monitor: Dominant rhythm normal sinus rhythm. Heart rate 51-128 bpm.  Avg HR 72 bpm. No atrial fibrillation, supraventricular tachycardia, nonsustained ventricular tachycardia, high grade AV block, pauses (3 seconds or longer). Total ventricular ectopic burden <1%. Total supraventricular ectopic burden <1%. Patient triggered events: 5.  Underlying rhythm normal sinus with the exception of one episode noted normal sinus with accelerated junctional rhythm (09/13/2020 at 7:31pm).  Carotid artery duplex 09/01/2020:  Right ICA appears to be near occlusive or occluded. Stenosis in the right external carotid artery (<50%). Peak systolic velocities in the left bifurcation, internal, external and common carotid arteries are within normal limits. Antegrade right vertebral artery flow. Antegrade left vertebral artery flow. If clinically indicated, consider CTA or cerebral angiogram. Follow up in 47month is appropriate if clinically indicated.  CTA head and neck: 10/05/2020: No acute intracranial abnormality. Calcified plaque at the right common carotid bifurcation and proximal internal carotid causing less than 50% stenosis. No measurable stenosis at the left ICA origin. Patent extracranial vertebral arteries without  stenosis. No significant intracranial stenosis.  LABORATORY DATA: CBC Latest Ref Rng & Units 06/06/2020 10/27/2019 10/05/2019  WBC 4.0 - 10.5 K/uL 8.2 4.5 5.4  Hemoglobin 12.0 - 15.0 g/dL 14.7 12.3 12.0  Hematocrit 36.0 - 46.0 % 44.8 37.9 37.2  Platelets 150 - 400 K/uL 185 196 190    CMP Latest Ref Rng & Units 06/06/2020 10/27/2019 06/25/2019  Glucose 70 - 99 mg/dL 105(H) 81 81  BUN 8 - 23 mg/dL 36(H) 30(H) 32(H)  Creatinine 0.44 - 1.00 mg/dL 0.96 1.08(H) 1.15(H)  Sodium 135 - 145 mmol/L 136 137 140  Potassium 3.5 - 5.1 mmol/L 4.2 4.6 4.7  Chloride 98 - 111 mmol/L 101 100 102  CO2 22 - 32 mmol/L _0 Calcium 8.9 - 10.3 mg/dL 9.5 10.0 9.9  Total Protein 6.5 - 8.1 g/dL 8.0 7.7 -  Total Bilirubin 0.3 - 1.2 mg/dL 1.1 0.5 -  Alkaline  Phos 38 - 126 U/L 58 87 -  AST 15 - 41 U/L 24 22 -  ALT 0 - 44 U/L 25 14 -    Lipid Panel     Component Value Date/Time   CHOL 176 10/27/2019 1334   TRIG 49 10/27/2019 1334   HDL 75 10/27/2019 1334   LDLCALC 91 10/27/2019 1334   LABVLDL 10 10/27/2019 1334    No components found for: NTPROBNP No results for input(s): PROBNP in the last 8760 hours. Recent Labs    10/27/19 1334  TSH 2.080    BMP Recent Labs    10/27/19 1334 06/06/20 2147  NA 137 136  K 4.6 4.2  CL 100 101  CO2 22 23  GLUCOSE 81 105*  BUN 30* 36*  CREATININE 1.08* 0.96  CALCIUM 10.0 9.5  GFRNONAA 51* >60  GFRAA 59*  --     HEMOGLOBIN A1C Lab Results  Component Value Date   HGBA1C 5.6 10/27/2019   External Labs:  Collected: 08/11/2020 provided by PCP office Hemoglobin 12.3 g/dL, hematocrit 36.9% Platelets 188,000 Creatinine 0.94 mg/dL. eGFR: 64 mL/min per 1.73 m Lipid profile: Total cholesterol 184, triglycerides 81, HDL 73, LDL 96, non-HDL 111  IMPRESSION:    ICD-10-CM   1. Palpitations  R00.2     2. Nonrheumatic mitral valve regurgitation  I34.0     3. Dyspnea on exertion  R06.00     4. Essential hypertension  I10     5. Atherosclerosis of  aorta (HCC)  I70.0     6. Prediabetes  R73.03     7. Obstructive sleep apnea on CPAP  G47.33    Z99.89     8. Mixed hyperlipidemia  E78.2     9. Family history of premature CAD  Z82.49     39. Encounter to discuss test results  Z71.2        RECOMMENDATIONS: Beth Sullivan is a 75 y.o. female whose past medical history and cardiac risk factors include:  COVID-19 infection, aortic atherosclerosis, prediabetes, hypertension, sleep apnea on CPAP, hyperlipidemia, vitamin D deficiency, postmenopausal female, advanced age.  Palpitations: Resolved.   14-day extended Holter monitor results reviewed.   No additional diagnostic testing needed at this time.  Continue to monitor for symptoms.    Dyspnea on exertion: Resolved. Echocardiogram results reviewed and noted above for further reference.  Mitral regurgitation: We will repeat an echocardiogram in 2 years to reevaluate progression prior to the next office visit. Currently remains asymptomatic Chronicity unknown.  Lightheaded and dizziness: Resolved. Carotid duplex suggestive of carotid artery stenosis.  Subsequently underwent CTA of the head and neck which notes patent bilateral carotid arteries. Continue to monitor.  Left carotid bruit: Reviewed the carotid duplex results and CTA of the head and neck with the patient at today's office visit.  Benign essential hypertension.   Office blood pressures well controlled. Currently managed by primary care provider.  Hyperlipidemia: Currently on statin therapy. Does not endorse myalgias. Currently managed by primary care provider.  FINAL MEDICATION LIST END OF ENCOUNTER: No orders of the defined types were placed in this encounter.   Medications Discontinued During This Encounter  Medication Reason   azelastine (ASTELIN) 0.1 % nasal spray Error   gabapentin (NEURONTIN) 300 MG capsule Error   triamcinolone (NASACORT) 55 MCG/ACT AERO nasal inhaler Error     Current Outpatient  Medications:    aspirin EC 81 MG tablet, Take 81 mg by mouth at bedtime., Disp: , Rfl:  atorvastatin (LIPITOR) 10 MG tablet, Take 10 mg by mouth at bedtime. , Disp: , Rfl:    Calcium Carbonate (CALCIUM 500 PO), Take 500 mg by mouth every other day., Disp: , Rfl:    chlorthalidone (HYGROTON) 25 MG tablet, Take 25 mg by mouth daily., Disp: , Rfl:    Cholecalciferol (VITAMIN D3 PO), Take 5,000 Int'l Units by mouth every other day., Disp: , Rfl:    finasteride (PROSCAR) 5 MG tablet, Take 2.5 mg by mouth daily., Disp: , Rfl:    losartan (COZAAR) 25 MG tablet, Take 25 mg by mouth daily., Disp: , Rfl:    Multiple Vitamin (MULTIVITAMIN WITH MINERALS) TABS tablet, Take 1 tablet by mouth daily., Disp: , Rfl:    spironolactone (ALDACTONE) 25 MG tablet, Take 25 mg by mouth daily., Disp: , Rfl:    valACYclovir (VALTREX) 1000 MG tablet, Take 500 mg by mouth 2 (two) times daily., Disp: , Rfl:    verapamil (COVERA HS) 240 MG (CO) 24 hr tablet, Take 240 mg by mouth 2 (two) times daily. , Disp: , Rfl:    vitamin C (ASCORBIC ACID) 500 MG tablet, Take 500 mg by mouth daily., Disp: , Rfl:   No orders of the defined types were placed in this encounter.   There are no Patient Instructions on file for this visit.   --Continue cardiac medications as reconciled in final medication list. --Return in about 2 years (around 10/21/2022) for Follow up mitral regurgitation, status post echo. Or sooner if needed. --Continue follow-up with your primary care physician regarding the management of your other chronic comorbid conditions.  Patient's questions and concerns were addressed to her satisfaction. She voices understanding of the instructions provided during this encounter.   This note was created using a voice recognition software as a result there may be grammatical errors inadvertently enclosed that do not reflect the nature of this encounter. Every attempt is made to correct such errors.  Rex Kras, [place],  [facility]  Pager: [phone] Office: [phone]

## 2020-10-20 ENCOUNTER — Encounter: Payer: Self-pay | Admitting: Cardiology

## 2020-10-20 ENCOUNTER — Ambulatory Visit: Payer: Medicare PPO | Admitting: Cardiology

## 2020-10-20 ENCOUNTER — Other Ambulatory Visit: Payer: Self-pay

## 2020-10-20 VITALS — BP 128/76 | HR 68 | Temp 97.7°F | Resp 16 | Ht 62.0 in | Wt 216.0 lb

## 2020-10-20 DIAGNOSIS — R002 Palpitations: Secondary | ICD-10-CM

## 2020-10-20 DIAGNOSIS — I340 Nonrheumatic mitral (valve) insufficiency: Secondary | ICD-10-CM

## 2020-10-20 DIAGNOSIS — Z712 Person consulting for explanation of examination or test findings: Secondary | ICD-10-CM

## 2020-10-20 DIAGNOSIS — I10 Essential (primary) hypertension: Secondary | ICD-10-CM

## 2020-10-20 DIAGNOSIS — G4733 Obstructive sleep apnea (adult) (pediatric): Secondary | ICD-10-CM

## 2020-10-20 DIAGNOSIS — E782 Mixed hyperlipidemia: Secondary | ICD-10-CM

## 2020-10-20 DIAGNOSIS — R0609 Other forms of dyspnea: Secondary | ICD-10-CM

## 2020-10-20 DIAGNOSIS — I700 Atherosclerosis of aorta: Secondary | ICD-10-CM

## 2020-10-20 DIAGNOSIS — Z8249 Family history of ischemic heart disease and other diseases of the circulatory system: Secondary | ICD-10-CM

## 2020-10-20 DIAGNOSIS — R7303 Prediabetes: Secondary | ICD-10-CM

## 2020-10-24 NOTE — Progress Notes (Deleted)
HPI F never smoker seen for OSA  Insomnia, complicated by morbid obesity/ gastric sleeve bariatric surgery, HBP, Upper Airway Cough, GERD,  NPSG 05/23/12- AHI 42.6 per hour. Weight 267 pounds Office spirometry 04/30/18- .WNL --------------------------------------------------------------------   7/12.21- 75 year old female never smoker followed for OSA, Insomnia, complicated by morbid obesity/ gastric sleeve bariatric surgery, HBP, Upper Airway Cough, GERD, Covid infection Jan 2021,  CPAP auto 10-15/APS Body weight today 215 lbs Download compliance 63%, AHI 10.3/ hr, more than half are centrals              Short and missed nights Nasal pillows mask slips off. Wants to try Full Face mask and higher pressure. Covid vax- 2 Phizer Discussed Covid ifection in January- outpatient.   10/25/20- 75 year old female never smoker followed for OSA, Insomnia, complicated by morbid obesity/ gastric sleeve bariatric surgery, HBP, Upper Airway Cough, GERD, Covid infection Jan 2021,  CPAP auto 10-15/APS Download- Body weight today- Covid vax-   ROS-see HPI   + = positive Constitutional:   +   weight loss, night sweats, fevers, chills, fatigue, lassitude. HEENT:   No-  headaches, difficulty swallowing, tooth/dental problems, sore throat,       No-  sneezing, itching, ear ache, +nasal congestion, post nasal drip,  CV:  No-   chest pain, orthopnea, PND, swelling in lower extremities, anasarca, dizziness, palpitations Resp: +shortness of breath with exertion or at rest.       No-   productive cough,  No non-productive cough,  No- coughing up of blood.              No-   change in color of mucus.  No- wheezing.   Skin: No-   rash or lesions. GI:  No-   heartburn, indigestion, abdominal pain, nausea, vomiting,  GU:  MS:  +  joint pain or swelling.   Neuro-     nothing unusual Psych:  No- change in mood or affect. No depression or anxiety.  No memory loss.  OBJ- Physical Exam General- Alert, Oriented,  Affect-appropriate, Distress- none acute. + Obese still Skin- rash-none, lesions- none, excoriation- none Lymphadenopathy- none Head- atraumatic            Eyes- Gross vision intact, PERRLA, conjunctivae and secretions clear            Ears- Hearing, canals-normal            Nose- Clear, no-Septal dev, mucus, polyps, erosion, perforation             Throat- Mallampati II-III , mucosa clear , drainage- none, tonsils- atrophic,  Neck- flexible , trachea midline, no stridor , thyroid nl, carotid no bruit Chest - symmetrical excursion , unlabored           Heart/CV- RRR , no murmur , no gallop  , no rub, nl s1 s2                           - JVD- none , edema- none, stasis changes- none, varices- none           Lung- clear to P&A, wheeze- none, cough-none, dullness-none, rub- none           Chest wall-  Abd-  Br/ Gen/ Rectal- Not done, not indicated Extrem- cyanosis- none, clubbing, none, atrophy- none, strength- nl Neuro- grossly intact to observation

## 2020-10-25 ENCOUNTER — Ambulatory Visit: Payer: Medicare PPO | Admitting: Internal Medicine

## 2020-10-27 ENCOUNTER — Other Ambulatory Visit: Payer: Self-pay | Admitting: Sports Medicine

## 2020-10-27 DIAGNOSIS — M25511 Pain in right shoulder: Secondary | ICD-10-CM

## 2020-11-01 DIAGNOSIS — E785 Hyperlipidemia, unspecified: Secondary | ICD-10-CM | POA: Diagnosis not present

## 2020-11-01 DIAGNOSIS — G4730 Sleep apnea, unspecified: Secondary | ICD-10-CM | POA: Diagnosis not present

## 2020-11-01 DIAGNOSIS — I10 Essential (primary) hypertension: Secondary | ICD-10-CM | POA: Diagnosis not present

## 2020-11-01 DIAGNOSIS — R7301 Impaired fasting glucose: Secondary | ICD-10-CM | POA: Diagnosis not present

## 2020-11-01 DIAGNOSIS — E559 Vitamin D deficiency, unspecified: Secondary | ICD-10-CM | POA: Diagnosis not present

## 2020-11-02 ENCOUNTER — Other Ambulatory Visit: Payer: Self-pay

## 2020-11-02 ENCOUNTER — Ambulatory Visit
Admission: RE | Admit: 2020-11-02 | Discharge: 2020-11-02 | Disposition: A | Discharge status: Home / Self Care | Payer: Medicare PPO | Source: Ambulatory Visit | Attending: Sports Medicine | Admitting: Sports Medicine

## 2020-11-02 DIAGNOSIS — M19012 Primary osteoarthritis, left shoulder: Secondary | ICD-10-CM | POA: Diagnosis not present

## 2020-11-02 DIAGNOSIS — M25412 Effusion, left shoulder: Secondary | ICD-10-CM | POA: Diagnosis not present

## 2020-11-02 DIAGNOSIS — M25511 Pain in right shoulder: Secondary | ICD-10-CM

## 2020-11-04 DIAGNOSIS — M75102 Unspecified rotator cuff tear or rupture of left shoulder, not specified as traumatic: Secondary | ICD-10-CM | POA: Diagnosis not present

## 2020-11-04 DIAGNOSIS — M2560 Stiffness of unspecified joint, not elsewhere classified: Secondary | ICD-10-CM | POA: Diagnosis not present

## 2020-11-04 DIAGNOSIS — M6281 Muscle weakness (generalized): Secondary | ICD-10-CM | POA: Diagnosis not present

## 2020-11-04 DIAGNOSIS — M25512 Pain in left shoulder: Secondary | ICD-10-CM | POA: Diagnosis not present

## 2020-11-08 DIAGNOSIS — M6281 Muscle weakness (generalized): Secondary | ICD-10-CM | POA: Diagnosis not present

## 2020-11-08 DIAGNOSIS — M2560 Stiffness of unspecified joint, not elsewhere classified: Secondary | ICD-10-CM | POA: Diagnosis not present

## 2020-11-08 DIAGNOSIS — M25512 Pain in left shoulder: Secondary | ICD-10-CM | POA: Diagnosis not present

## 2020-11-08 DIAGNOSIS — M75102 Unspecified rotator cuff tear or rupture of left shoulder, not specified as traumatic: Secondary | ICD-10-CM | POA: Diagnosis not present

## 2020-11-10 DIAGNOSIS — M6281 Muscle weakness (generalized): Secondary | ICD-10-CM | POA: Diagnosis not present

## 2020-11-10 DIAGNOSIS — M25512 Pain in left shoulder: Secondary | ICD-10-CM | POA: Diagnosis not present

## 2020-11-10 DIAGNOSIS — M75102 Unspecified rotator cuff tear or rupture of left shoulder, not specified as traumatic: Secondary | ICD-10-CM | POA: Diagnosis not present

## 2020-11-10 DIAGNOSIS — M2560 Stiffness of unspecified joint, not elsewhere classified: Secondary | ICD-10-CM | POA: Diagnosis not present

## 2020-11-13 ENCOUNTER — Encounter: Payer: Self-pay | Admitting: Internal Medicine

## 2020-11-14 NOTE — Progress Notes (Signed)
HPI F never smoker seen for OSA  Insomnia, complicated by morbid obesity/ gastric sleeve bariatric surgery, HBP, Upper Airway Cough, GERD,  NPSG 05/23/12- AHI 42.6 per hour. Weight 267 pounds Office spirometry 04/30/18- .WNL --------------------------------------------------------------------   7/12.21- 75 year old female never smoker followed for OSA, Insomnia, complicated by morbid obesity/ gastric sleeve bariatric surgery, HBP, Upper Airway Cough, GERD, Covid infection Jan 2021,  CPAP auto 10-15/APS Body weight today 215 lbs Download compliance 63%, AHI 10.3/ hr, more than half are centrals              Short and missed nights Nasal pillows mask slips off. Wants to try Full Face mask and higher pressure. Covid vax- 2 Phizer Discussed Covid ifection in January- outpatient.   11/15/20- 75 year old female never smoker followed for OSA, Insomnia, complicated by morbid obesity/ gastric sleeve bariatric surgery, HBP, Upper Airway Cough, GERD, Covid infection Jan 2021,  CPAP auto 10-15/APS/ [employer]     AirSense 10 AutoSet>> today 8-12 Download-compliance 87%, AHI 11.4/ hr     Mild-moderate leak     Mostly Centrals Body weight today-217 lbs Covid vax- 4 Phizer -----Feels like she is having issues with "drying out" during CPAP use, then has issues with coughing at night Download shows max pressure about 14. Most breakthrough apneas are centrals. We are going to try reducing flow to reduce dryness. Can also try nasal saline gel.   ROS-see HPI   + = positive Constitutional:   +   weight loss, night sweats, fevers, chills, fatigue, lassitude. HEENT:   No-  headaches, difficulty swallowing, tooth/dental problems, sore throat,       No-  sneezing, itching, ear ache, +nasal congestion, post nasal drip,  CV:  No-   chest pain, orthopnea, PND, swelling in lower extremities, anasarca, dizziness, palpitations Resp: +shortness of breath with exertion or at rest.       No-   productive cough,  No  non-productive cough,  No- coughing up of blood.              No-   change in color of mucus.  No- wheezing.   Skin: No-   rash or lesions. GI:  No-   heartburn, indigestion, abdominal pain, nausea, vomiting,  GU:  MS:  +  joint pain or swelling.   Neuro-     nothing unusual Psych:  No- change in mood or affect. No depression or anxiety.  No memory loss.  OBJ- Physical Exam General- Alert, Oriented, Affect-appropriate, Distress- none acute. + Obese still Skin- rash-none, lesions- none, excoriation- none Lymphadenopathy- none Head- atraumatic            Eyes- Gross vision intact, PERRLA, conjunctivae and secretions clear            Ears- Hearing, canals-normal            Nose- Clear, no-Septal dev, mucus, polyps, erosion, perforation             Throat- Mallampati II-III , mucosa clear , drainage- none, tonsils- atrophic, +teeth Neck- flexible , trachea midline, no stridor , thyroid nl, carotid no bruit Chest - symmetrical excursion , unlabored           Heart/CV- RRR , no murmur , no gallop  , no rub, nl s1 s2                           - JVD- none , edema- none, stasis changes- none, varices-  none           Lung- clear to P&A, wheeze- none, cough-none, dullness-none, rub- none           Chest wall-  Abd-  Br/ Gen/ Rectal- Not done, not indicated Extrem- cyanosis- none, clubbing, none, atrophy- none, strength- nl Neuro- grossly intact to observation

## 2020-11-15 ENCOUNTER — Encounter: Payer: Self-pay | Admitting: Internal Medicine

## 2020-11-15 ENCOUNTER — Other Ambulatory Visit: Payer: Self-pay

## 2020-11-15 ENCOUNTER — Ambulatory Visit: Payer: Medicare PPO | Admitting: Internal Medicine

## 2020-11-15 VITALS — BP 136/65 | HR 72 | Temp 97.8°F | Resp 18 | Ht 62.0 in | Wt 217.0 lb

## 2020-11-15 DIAGNOSIS — M25512 Pain in left shoulder: Secondary | ICD-10-CM | POA: Diagnosis not present

## 2020-11-15 DIAGNOSIS — M75102 Unspecified rotator cuff tear or rupture of left shoulder, not specified as traumatic: Secondary | ICD-10-CM | POA: Diagnosis not present

## 2020-11-15 DIAGNOSIS — Z9989 Dependence on other enabling machines and devices: Secondary | ICD-10-CM

## 2020-11-15 DIAGNOSIS — M2560 Stiffness of unspecified joint, not elsewhere classified: Secondary | ICD-10-CM | POA: Diagnosis not present

## 2020-11-15 DIAGNOSIS — G4733 Obstructive sleep apnea (adult) (pediatric): Secondary | ICD-10-CM

## 2020-11-15 DIAGNOSIS — M6281 Muscle weakness (generalized): Secondary | ICD-10-CM | POA: Diagnosis not present

## 2020-11-15 NOTE — Assessment & Plan Note (Signed)
Continue efforts with diet and exercise 

## 2020-11-15 NOTE — Assessment & Plan Note (Signed)
Does benefit from CPAP Plan- reduce auto range to 8-12 to reduce flow. Watch impact on residual AHI. Try nasal saline gel for dryness.

## 2020-11-15 NOTE — Patient Instructions (Signed)
Order- DME APS/ [employer]-   please reduce autopap range to 8-12, continue mask of choice, humidifier, supplies, AirView/ card  You might try otc saline nasal gel (AYR, NeilMed, store brand, etc)  Please call us and your DME company for help getting comfortable with your CPAP as needed

## 2020-11-17 DIAGNOSIS — M2560 Stiffness of unspecified joint, not elsewhere classified: Secondary | ICD-10-CM | POA: Diagnosis not present

## 2020-11-17 DIAGNOSIS — Z903 Acquired absence of stomach [part of]: Secondary | ICD-10-CM | POA: Diagnosis not present

## 2020-11-17 DIAGNOSIS — E669 Obesity, unspecified: Secondary | ICD-10-CM | POA: Diagnosis not present

## 2020-11-17 DIAGNOSIS — M75102 Unspecified rotator cuff tear or rupture of left shoulder, not specified as traumatic: Secondary | ICD-10-CM | POA: Diagnosis not present

## 2020-11-17 DIAGNOSIS — M25512 Pain in left shoulder: Secondary | ICD-10-CM | POA: Diagnosis not present

## 2020-11-17 DIAGNOSIS — M6281 Muscle weakness (generalized): Secondary | ICD-10-CM | POA: Diagnosis not present

## 2020-11-18 DIAGNOSIS — E669 Obesity, unspecified: Secondary | ICD-10-CM | POA: Diagnosis not present

## 2020-11-22 DIAGNOSIS — M2560 Stiffness of unspecified joint, not elsewhere classified: Secondary | ICD-10-CM | POA: Diagnosis not present

## 2020-11-22 DIAGNOSIS — M6281 Muscle weakness (generalized): Secondary | ICD-10-CM | POA: Diagnosis not present

## 2020-11-22 DIAGNOSIS — M75102 Unspecified rotator cuff tear or rupture of left shoulder, not specified as traumatic: Secondary | ICD-10-CM | POA: Diagnosis not present

## 2020-11-22 DIAGNOSIS — M25512 Pain in left shoulder: Secondary | ICD-10-CM | POA: Diagnosis not present

## 2020-11-24 DIAGNOSIS — M25551 Pain in right hip: Secondary | ICD-10-CM | POA: Diagnosis not present

## 2020-11-24 DIAGNOSIS — M75102 Unspecified rotator cuff tear or rupture of left shoulder, not specified as traumatic: Secondary | ICD-10-CM | POA: Diagnosis not present

## 2020-11-24 DIAGNOSIS — M6281 Muscle weakness (generalized): Secondary | ICD-10-CM | POA: Diagnosis not present

## 2020-11-24 DIAGNOSIS — M25512 Pain in left shoulder: Secondary | ICD-10-CM | POA: Diagnosis not present

## 2020-11-24 DIAGNOSIS — M2560 Stiffness of unspecified joint, not elsewhere classified: Secondary | ICD-10-CM | POA: Diagnosis not present

## 2020-11-24 DIAGNOSIS — M7582 Other shoulder lesions, left shoulder: Secondary | ICD-10-CM | POA: Diagnosis not present

## 2020-11-29 DIAGNOSIS — M2560 Stiffness of unspecified joint, not elsewhere classified: Secondary | ICD-10-CM | POA: Diagnosis not present

## 2020-11-29 DIAGNOSIS — M25512 Pain in left shoulder: Secondary | ICD-10-CM | POA: Diagnosis not present

## 2020-11-29 DIAGNOSIS — M75102 Unspecified rotator cuff tear or rupture of left shoulder, not specified as traumatic: Secondary | ICD-10-CM | POA: Diagnosis not present

## 2020-11-29 DIAGNOSIS — M6281 Muscle weakness (generalized): Secondary | ICD-10-CM | POA: Diagnosis not present

## 2020-12-01 DIAGNOSIS — M75102 Unspecified rotator cuff tear or rupture of left shoulder, not specified as traumatic: Secondary | ICD-10-CM | POA: Diagnosis not present

## 2020-12-01 DIAGNOSIS — M6281 Muscle weakness (generalized): Secondary | ICD-10-CM | POA: Diagnosis not present

## 2020-12-01 DIAGNOSIS — M2560 Stiffness of unspecified joint, not elsewhere classified: Secondary | ICD-10-CM | POA: Diagnosis not present

## 2020-12-01 DIAGNOSIS — M25512 Pain in left shoulder: Secondary | ICD-10-CM | POA: Diagnosis not present

## 2020-12-06 DIAGNOSIS — M2560 Stiffness of unspecified joint, not elsewhere classified: Secondary | ICD-10-CM | POA: Diagnosis not present

## 2020-12-06 DIAGNOSIS — M75102 Unspecified rotator cuff tear or rupture of left shoulder, not specified as traumatic: Secondary | ICD-10-CM | POA: Diagnosis not present

## 2020-12-06 DIAGNOSIS — M6281 Muscle weakness (generalized): Secondary | ICD-10-CM | POA: Diagnosis not present

## 2020-12-06 DIAGNOSIS — M25512 Pain in left shoulder: Secondary | ICD-10-CM | POA: Diagnosis not present

## 2020-12-13 DIAGNOSIS — M25512 Pain in left shoulder: Secondary | ICD-10-CM | POA: Diagnosis not present

## 2020-12-13 DIAGNOSIS — M6281 Muscle weakness (generalized): Secondary | ICD-10-CM | POA: Diagnosis not present

## 2020-12-13 DIAGNOSIS — M2560 Stiffness of unspecified joint, not elsewhere classified: Secondary | ICD-10-CM | POA: Diagnosis not present

## 2020-12-13 DIAGNOSIS — M75102 Unspecified rotator cuff tear or rupture of left shoulder, not specified as traumatic: Secondary | ICD-10-CM | POA: Diagnosis not present

## 2020-12-15 DIAGNOSIS — M6281 Muscle weakness (generalized): Secondary | ICD-10-CM | POA: Diagnosis not present

## 2020-12-15 DIAGNOSIS — M75102 Unspecified rotator cuff tear or rupture of left shoulder, not specified as traumatic: Secondary | ICD-10-CM | POA: Diagnosis not present

## 2020-12-15 DIAGNOSIS — M2560 Stiffness of unspecified joint, not elsewhere classified: Secondary | ICD-10-CM | POA: Diagnosis not present

## 2020-12-15 DIAGNOSIS — M25512 Pain in left shoulder: Secondary | ICD-10-CM | POA: Diagnosis not present

## 2020-12-21 DIAGNOSIS — L659 Nonscarring hair loss, unspecified: Secondary | ICD-10-CM | POA: Diagnosis not present

## 2020-12-21 DIAGNOSIS — G4733 Obstructive sleep apnea (adult) (pediatric): Secondary | ICD-10-CM | POA: Diagnosis not present

## 2020-12-21 DIAGNOSIS — Z8249 Family history of ischemic heart disease and other diseases of the circulatory system: Secondary | ICD-10-CM | POA: Diagnosis not present

## 2020-12-21 DIAGNOSIS — K219 Gastro-esophageal reflux disease without esophagitis: Secondary | ICD-10-CM | POA: Diagnosis not present

## 2020-12-21 DIAGNOSIS — E785 Hyperlipidemia, unspecified: Secondary | ICD-10-CM | POA: Diagnosis not present

## 2020-12-21 DIAGNOSIS — I10 Essential (primary) hypertension: Secondary | ICD-10-CM | POA: Diagnosis not present

## 2020-12-21 DIAGNOSIS — Z823 Family history of stroke: Secondary | ICD-10-CM | POA: Diagnosis not present

## 2020-12-21 DIAGNOSIS — Z7982 Long term (current) use of aspirin: Secondary | ICD-10-CM | POA: Diagnosis not present

## 2021-01-03 ENCOUNTER — Ambulatory Visit: Payer: Medicare PPO | Admitting: Allergy & Immunology

## 2021-01-11 DIAGNOSIS — Z03818 Encounter for observation for suspected exposure to other biological agents ruled out: Secondary | ICD-10-CM | POA: Diagnosis not present

## 2021-01-11 DIAGNOSIS — Z20822 Contact with and (suspected) exposure to covid-19: Secondary | ICD-10-CM | POA: Diagnosis not present

## 2021-01-11 DIAGNOSIS — M25551 Pain in right hip: Secondary | ICD-10-CM | POA: Diagnosis not present

## 2021-01-11 DIAGNOSIS — M25512 Pain in left shoulder: Secondary | ICD-10-CM | POA: Diagnosis not present

## 2021-02-17 DIAGNOSIS — G4733 Obstructive sleep apnea (adult) (pediatric): Secondary | ICD-10-CM | POA: Diagnosis not present

## 2021-02-27 DIAGNOSIS — J029 Acute pharyngitis, unspecified: Secondary | ICD-10-CM | POA: Diagnosis not present

## 2021-02-27 DIAGNOSIS — B349 Viral infection, unspecified: Secondary | ICD-10-CM | POA: Diagnosis not present

## 2021-02-27 DIAGNOSIS — R059 Cough, unspecified: Secondary | ICD-10-CM | POA: Diagnosis not present

## 2021-02-27 DIAGNOSIS — U071 COVID-19: Secondary | ICD-10-CM | POA: Diagnosis not present

## 2021-03-22 DIAGNOSIS — Z0000 Encounter for general adult medical examination without abnormal findings: Secondary | ICD-10-CM | POA: Diagnosis not present

## 2021-03-22 DIAGNOSIS — Z1389 Encounter for screening for other disorder: Secondary | ICD-10-CM | POA: Diagnosis not present

## 2021-05-04 DIAGNOSIS — R300 Dysuria: Secondary | ICD-10-CM | POA: Diagnosis not present

## 2021-05-04 DIAGNOSIS — G4730 Sleep apnea, unspecified: Secondary | ICD-10-CM | POA: Diagnosis not present

## 2021-05-04 DIAGNOSIS — L989 Disorder of the skin and subcutaneous tissue, unspecified: Secondary | ICD-10-CM | POA: Diagnosis not present

## 2021-05-04 DIAGNOSIS — E785 Hyperlipidemia, unspecified: Secondary | ICD-10-CM | POA: Diagnosis not present

## 2021-05-04 DIAGNOSIS — R7301 Impaired fasting glucose: Secondary | ICD-10-CM | POA: Diagnosis not present

## 2021-05-04 DIAGNOSIS — I10 Essential (primary) hypertension: Secondary | ICD-10-CM | POA: Diagnosis not present

## 2021-05-04 DIAGNOSIS — L659 Nonscarring hair loss, unspecified: Secondary | ICD-10-CM | POA: Diagnosis not present

## 2021-05-04 DIAGNOSIS — E559 Vitamin D deficiency, unspecified: Secondary | ICD-10-CM | POA: Diagnosis not present

## 2021-05-08 DIAGNOSIS — L659 Nonscarring hair loss, unspecified: Secondary | ICD-10-CM | POA: Diagnosis not present

## 2021-05-08 DIAGNOSIS — K219 Gastro-esophageal reflux disease without esophagitis: Secondary | ICD-10-CM | POA: Diagnosis not present

## 2021-05-08 DIAGNOSIS — E785 Hyperlipidemia, unspecified: Secondary | ICD-10-CM | POA: Diagnosis not present

## 2021-05-08 DIAGNOSIS — M1990 Unspecified osteoarthritis, unspecified site: Secondary | ICD-10-CM | POA: Diagnosis not present

## 2021-05-08 DIAGNOSIS — G629 Polyneuropathy, unspecified: Secondary | ICD-10-CM | POA: Diagnosis not present

## 2021-05-08 DIAGNOSIS — B009 Herpesviral infection, unspecified: Secondary | ICD-10-CM | POA: Diagnosis not present

## 2021-05-08 DIAGNOSIS — I10 Essential (primary) hypertension: Secondary | ICD-10-CM | POA: Diagnosis not present

## 2021-05-08 DIAGNOSIS — K5900 Constipation, unspecified: Secondary | ICD-10-CM | POA: Diagnosis not present

## 2021-05-11 ENCOUNTER — Other Ambulatory Visit: Payer: Self-pay | Admitting: Family Medicine

## 2021-05-11 DIAGNOSIS — Z1231 Encounter for screening mammogram for malignant neoplasm of breast: Secondary | ICD-10-CM

## 2021-06-05 ENCOUNTER — Ambulatory Visit
Admission: RE | Admit: 2021-06-05 | Discharge: 2021-06-05 | Disposition: A | Discharge status: Home / Self Care | Payer: Medicare PPO | Source: Ambulatory Visit | Attending: Family Medicine | Admitting: Family Medicine

## 2021-06-05 DIAGNOSIS — Z1231 Encounter for screening mammogram for malignant neoplasm of breast: Secondary | ICD-10-CM

## 2021-07-05 DIAGNOSIS — M25512 Pain in left shoulder: Secondary | ICD-10-CM | POA: Diagnosis not present

## 2021-07-05 DIAGNOSIS — M25551 Pain in right hip: Secondary | ICD-10-CM | POA: Diagnosis not present

## 2021-07-28 DIAGNOSIS — M25512 Pain in left shoulder: Secondary | ICD-10-CM | POA: Diagnosis not present

## 2021-08-24 ENCOUNTER — Other Ambulatory Visit: Payer: Medicare PPO

## 2021-08-25 ENCOUNTER — Other Ambulatory Visit: Payer: Medicare PPO

## 2021-08-31 ENCOUNTER — Ambulatory Visit: Payer: Medicare PPO

## 2021-08-31 DIAGNOSIS — I6521 Occlusion and stenosis of right carotid artery: Secondary | ICD-10-CM

## 2021-09-05 DIAGNOSIS — M25552 Pain in left hip: Secondary | ICD-10-CM | POA: Diagnosis not present

## 2021-09-22 NOTE — Progress Notes (Signed)
Called and spoke to patient she voiced understanding she was transferred to get echo scheduled and appt

## 2021-09-28 DIAGNOSIS — L820 Inflamed seborrheic keratosis: Secondary | ICD-10-CM | POA: Diagnosis not present

## 2021-09-28 DIAGNOSIS — L669 Cicatricial alopecia, unspecified: Secondary | ICD-10-CM | POA: Diagnosis not present

## 2021-10-04 ENCOUNTER — Encounter: Payer: Self-pay | Admitting: Student

## 2021-11-14 ENCOUNTER — Encounter: Payer: Self-pay | Admitting: Internal Medicine

## 2021-11-15 ENCOUNTER — Ambulatory Visit: Payer: Medicare PPO | Admitting: Internal Medicine

## 2021-11-15 NOTE — Progress Notes (Unsigned)
HPI F never smoker seen for OSA  Insomnia, complicated by morbid obesity/ gastric sleeve bariatric surgery, HBP, Upper Airway Cough, GERD,  NPSG 05/23/12- AHI 42.6 per hour. Weight 267 pounds Office spirometry 04/30/18- .WNL --------------------------------------------------------------------  11/15/20- 76 year old female never smoker followed for OSA, Insomnia, complicated by morbid obesity/ gastric sleeve bariatric surgery, HBP, Upper Airway Cough, GERD, Covid infection Jan 2021,  CPAP auto 10-15/APS/ [facility]     AirSense 10 AutoSet>> today 8-12 Download-compliance 87%, AHI 11.4/ hr     Mild-moderate leak     Mostly Centrals Body weight today-217 lbs Covid vax- 4 Phizer -----Feels like she is having issues with "drying out" during CPAP use, then has issues with coughing at night Download shows max pressure about 14. Most breakthrough apneas are centrals. We are going to try reducing flow to reduce dryness. Can also try nasal saline gel.  11/16/21- 76 year old female never smoker followed for OSA, Insomnia, complicated by morbid obesity/ gastric sleeve bariatric surgery, HTN, Upper Airway Cough, GERD, Covid infection Jan 2021,  CPAP auto 8-12/APS/ [facility]     AirSense 10 AutoSet Download-compliance  Body weight today- Covid vax- 4 Phizer    ROS-see HPI   + = positive Constitutional:   +   weight loss, night sweats, fevers, chills, fatigue, lassitude. HEENT:   No-  headaches, difficulty swallowing, tooth/dental problems, sore throat,       No-  sneezing, itching, ear ache, +nasal congestion, post nasal drip,  CV:  No-   chest pain, orthopnea, PND, swelling in lower extremities, anasarca, dizziness, palpitations Resp: +shortness of breath with exertion or at rest.       No-   productive cough,  No non-productive cough,  No- coughing up of blood.              No-   change in color of mucus.  No- wheezing.   Skin: No-   rash or lesions. GI:  No-   heartburn, indigestion, abdominal pain, nausea,  vomiting,  GU:  MS:  +  joint pain or swelling.   Neuro-     nothing unusual Psych:  No- change in mood or affect. No depression or anxiety.  No memory loss.  OBJ- Physical Exam General- Alert, Oriented, Affect-appropriate, Distress- none acute. + Obese still Skin- rash-none, lesions- none, excoriation- none Lymphadenopathy- none Head- atraumatic            Eyes- Gross vision intact, PERRLA, conjunctivae and secretions clear            Ears- Hearing, canals-normal            Nose- Clear, no-Septal dev, mucus, polyps, erosion, perforation             Throat- Mallampati II-III , mucosa clear , drainage- none, tonsils- atrophic, +teeth Neck- flexible , trachea midline, no stridor , thyroid nl, carotid no bruit Chest - symmetrical excursion , unlabored           Heart/CV- RRR , no murmur , no gallop  , no rub, nl s1 s2                           - JVD- none , edema- none, stasis changes- none, varices- none           Lung- clear to P&A, wheeze- none, cough-none, dullness-none, rub- none           Chest wall-  Abd-  Br/ Gen/ Rectal- Not done,  not indicated Extrem- cyanosis- none, clubbing, none, atrophy- none, strength- nl Neuro- grossly intact to observation

## 2021-11-16 ENCOUNTER — Ambulatory Visit: Payer: Medicare PPO | Admitting: Internal Medicine

## 2021-11-16 ENCOUNTER — Encounter: Payer: Self-pay | Admitting: Internal Medicine

## 2021-11-16 VITALS — BP 102/62 | HR 71 | Ht 62.0 in | Wt 222.4 lb

## 2021-11-16 DIAGNOSIS — G4733 Obstructive sleep apnea (adult) (pediatric): Secondary | ICD-10-CM

## 2021-11-16 NOTE — Patient Instructions (Signed)
Order- schedule mask fitting/ desensitization at [place]   dx OSA  Order- [facility]- Beth Sullivan would like to change DME to a company closer than [facility] To continue CPAP auto 8-12, mask of choice humidifier, supplies, AirView/ card  Try otc Biotene mouth wash as a rinse at bedtime or whenever you want. See if it helps with dry mouth. It is ok to adjust the humidifier setting on your CPAP for comfort.  Please call if we can help

## 2021-11-16 NOTE — Assessment & Plan Note (Signed)
Comfort improvements needed to restore compliance. Plan-continue auto 8-12.  Refer for mask fitting.  Try Biotene for dry mouth.  Ask [facility] help exploring whether we can change her DME to a company closer than [place], as she requests.

## 2021-11-16 NOTE — Assessment & Plan Note (Signed)
She has not changed her lifestyle sufficiently to accomplish meaningful weight loss.  This is a long-term goal.

## 2021-11-22 ENCOUNTER — Encounter (INDEPENDENT_AMBULATORY_CARE_PROVIDER_SITE_OTHER): Payer: Self-pay

## 2021-12-26 ENCOUNTER — Telehealth: Payer: Self-pay | Admitting: Internal Medicine

## 2021-12-26 ENCOUNTER — Ambulatory Visit (HOSPITAL_BASED_OUTPATIENT_CLINIC_OR_DEPARTMENT_OTHER): Payer: Medicare PPO | Attending: Internal Medicine

## 2021-12-26 DIAGNOSIS — G4733 Obstructive sleep apnea (adult) (pediatric): Secondary | ICD-10-CM

## 2022-01-01 NOTE — Telephone Encounter (Signed)
Patient is calling back wanting to know if there is a closer DME company to her here in [place]. Currently using [facility] in [facility].  Please call back at [phone]

## 2022-01-02 NOTE — Telephone Encounter (Signed)
Called patient and notified her that if she called [facility] in [place] and just inform them to transfer her care to the [facility] in [place] then there should be no issues. Patient verbalized understanding. Nothing further needed

## 2022-01-03 DIAGNOSIS — M25551 Pain in right hip: Secondary | ICD-10-CM | POA: Diagnosis not present

## 2022-01-08 DIAGNOSIS — Z0184 Encounter for antibody response examination: Secondary | ICD-10-CM | POA: Diagnosis not present

## 2022-01-24 DIAGNOSIS — L729 Follicular cyst of the skin and subcutaneous tissue, unspecified: Secondary | ICD-10-CM | POA: Diagnosis not present

## 2022-01-24 DIAGNOSIS — Z6841 Body Mass Index (BMI) 40.0 and over, adult: Secondary | ICD-10-CM | POA: Diagnosis not present

## 2022-02-08 ENCOUNTER — Other Ambulatory Visit: Payer: Self-pay

## 2022-02-08 DIAGNOSIS — I10 Essential (primary) hypertension: Secondary | ICD-10-CM

## 2022-02-09 ENCOUNTER — Other Ambulatory Visit: Payer: Self-pay

## 2022-02-09 DIAGNOSIS — I10 Essential (primary) hypertension: Secondary | ICD-10-CM

## 2022-02-15 DIAGNOSIS — L668 Other cicatricial alopecia: Secondary | ICD-10-CM | POA: Diagnosis not present

## 2022-02-15 DIAGNOSIS — L658 Other specified nonscarring hair loss: Secondary | ICD-10-CM | POA: Diagnosis not present

## 2022-02-15 DIAGNOSIS — L820 Inflamed seborrheic keratosis: Secondary | ICD-10-CM | POA: Diagnosis not present

## 2022-02-15 DIAGNOSIS — L739 Follicular disorder, unspecified: Secondary | ICD-10-CM | POA: Diagnosis not present

## 2022-03-12 DIAGNOSIS — Z6841 Body Mass Index (BMI) 40.0 and over, adult: Secondary | ICD-10-CM | POA: Diagnosis not present

## 2022-03-12 DIAGNOSIS — R059 Cough, unspecified: Secondary | ICD-10-CM | POA: Diagnosis not present

## 2022-03-15 ENCOUNTER — Ambulatory Visit: Payer: Medicare PPO

## 2022-03-15 DIAGNOSIS — I10 Essential (primary) hypertension: Secondary | ICD-10-CM

## 2022-03-16 NOTE — Progress Notes (Deleted)
HPI F never smoker seen for OSA  Insomnia, complicated by morbid obesity/ gastric sleeve bariatric surgery, HBP, Upper Airway Cough, GERD,  NPSG 05/23/12- AHI 42.6 per hour. Weight 267 pounds Office spirometry 04/30/18- .WNL --------------------------------------------------------------------  11/16/21- 76 year old female never smoker followed for OSA, Insomnia, complicated by morbid obesity/ gastric sleeve bariatric surgery, HTN, Upper Airway Cough, GERD, Covid infection Jan 2021,  CPAP auto 8-12/APS/ [facility] [employer]     AirSense 10 AutoSet Download-compliance  0%, AHI 12.8/ hr Body weight today-222  lbs Covid vax- 4 Phizer ------Pt f/u for OSA, pt states that the mask isn't getting a good seal and she's waking up have to readjust constantly. Pt says she is getting about 5-6hrs/night w/o her mask. Also feels like she's still not getting enough oxygen. Download reviewed.  She actually does not know about oxygenation but is complaining of waking in the morning with dry mucous in her mouth.  We discussed adjusting humidifier, refitting mask and possible trial of Biotene.  She would like to get a DME company closer and more convenient to her than [place]. We cannot get her more comfortable with CPAP it may be appropriate now to update her sleep study.  03/19/22- 76 year old female never smoker followed for OSA, Insomnia, complicated by morbid obesity/ gastric sleeve bariatric surgery, HTN, Upper Airway Cough, GERD, Covid infection Jan 2021,  CPAP auto 8-12/APS/ [facility]     AirSense 10 AutoSet Download-compliance   Body weight today- Covid vax- 4 Phizer  ROS-see HPI   + = positive Constitutional:   +   weight loss, night sweats, fevers, chills, fatigue, lassitude. HEENT:   No-  headaches, difficulty swallowing, tooth/dental problems, sore throat,       No-  sneezing, itching, ear ache, +nasal congestion, post nasal drip,  CV:  No-   chest pain, orthopnea, PND, swelling in lower extremities, anasarca,  dizziness, palpitations Resp: +shortness of breath with exertion or at rest.       No-   productive cough,  No non-productive cough,  No- coughing up of blood.              No-   change in color of mucus.  No- wheezing.   Skin: No-   rash or lesions. GI:  No-   heartburn, indigestion, abdominal pain, nausea, vomiting,  GU:  MS:  +  joint pain or swelling.   Neuro-     nothing unusual Psych:  No- change in mood or affect. No depression or anxiety.  No memory loss.  OBJ- Physical Exam General- Alert, Oriented, Affect-appropriate, Distress- none acute. + Obese  Skin- rash-none, lesions- none, excoriation- none Lymphadenopathy- none Head- atraumatic            Eyes- Gross vision intact, PERRLA, conjunctivae and secretions clear            Ears- Hearing, canals-normal            Nose- Clear, no-Septal dev, mucus, polyps, erosion, perforation             Throat- Mallampati II-III , mucosa clear , drainage- none, tonsils- atrophic, +teeth Neck- flexible , trachea midline, no stridor , thyroid nl, carotid no bruit Chest - symmetrical excursion , unlabored           Heart/CV- RRR , no murmur , no gallop  , no rub, nl s1 s2                           -  JVD- none , edema- none, stasis changes- none, varices- none           Lung- clear to P&A, wheeze- none, cough-none, dullness-none, rub- none           Chest wall-  Abd-  Br/ Gen/ Rectal- Not done, not indicated Extrem- cyanosis- none, clubbing, none, atrophy- none, strength- nl Neuro- grossly intact to observation

## 2022-03-19 ENCOUNTER — Ambulatory Visit: Payer: Medicare PPO | Admitting: Internal Medicine

## 2022-03-22 ENCOUNTER — Encounter: Payer: Self-pay | Admitting: Cardiology

## 2022-03-22 ENCOUNTER — Ambulatory Visit: Payer: Medicare PPO | Admitting: Cardiology

## 2022-03-22 VITALS — BP 121/77 | HR 80 | Resp 18 | Ht 62.0 in | Wt 227.8 lb

## 2022-03-22 DIAGNOSIS — I10 Essential (primary) hypertension: Secondary | ICD-10-CM | POA: Diagnosis not present

## 2022-03-22 DIAGNOSIS — I340 Nonrheumatic mitral (valve) insufficiency: Secondary | ICD-10-CM | POA: Diagnosis not present

## 2022-03-22 DIAGNOSIS — I700 Atherosclerosis of aorta: Secondary | ICD-10-CM

## 2022-03-22 DIAGNOSIS — E782 Mixed hyperlipidemia: Secondary | ICD-10-CM

## 2022-03-22 DIAGNOSIS — G4733 Obstructive sleep apnea (adult) (pediatric): Secondary | ICD-10-CM

## 2022-03-22 NOTE — Progress Notes (Signed)
Date:  03/22/2022   ID:  [name], DOB Nov 05, 1945, MRN [id number]  PCP:  Beth Shanks, MD (Inactive)  Cardiologist:  Rex Kras, DO, [facility] (established care 08/25/2020)  Date: 03/22/22 Last Office Visit: 10/20/2021  Chief Complaint  Patient presents with   Follow-up    6 month Mitral regurgitation follow-up. Discussed test results    HPI  Beth Sullivan is a 76 y.o. female whose past medical history and cardiovascular risk factors include: COVID-19 infection, aortic atherosclerosis, prediabetes, hypertension, sleep apnea on CPAP, hyperlipidemia, vitamin D deficiency, postmenopausal female, advanced age.  Patient presents today for 35-monthfollow-up to reevaluate the severity of mitral regurgitation.  During the prior workup she was noted to have at least moderate mitral regurgitation per 2D echocardiogram.  Most recent echocardiogram notes improvement in the mitral regurgitation likely secondary to blood pressure improvements.  Since last office visit she denies anginal discomfort or heart failure symptoms.  Since last office encounter no hospitalizations or urgent care visits for cardiovascular symptoms.  Family history of premature coronary artery disease.  Mom had her first myocardial infarction in her mid 568s  Sister had a heart attack about the age of 687  FUNCTIONAL STATUS: Water aerobics 3 times a week.  ALLERGIES: Allergies  Allergen Reactions   Hydrocodone Itching   Tramadol Nausea And Vomiting   Tramadol Hcl Nausea And Vomiting    MEDICATION LIST PRIOR TO VISIT: Current Meds  Medication Sig   aspirin EC 81 MG tablet Take 81 mg by mouth at bedtime.   atorvastatin (LIPITOR) 10 MG tablet Take 10 mg by mouth at bedtime.    Calcium Carbonate (CALCIUM 500 PO) Take 500 mg by mouth every other day.   chlorthalidone (HYGROTON) 25 MG tablet Take 25 mg by mouth daily. Pt taking 1.5 tablets/daily   Cholecalciferol (VITAMIN D3 PO) Take 5,000 Int'l Units by mouth  every other day.   Multiple Vitamin (MULTIVITAMIN WITH MINERALS) TABS tablet Take 1 tablet by mouth daily.   spironolactone (ALDACTONE) 25 MG tablet Take 25 mg by mouth daily. Cutting tablet in half for 12.570m  valACYclovir (VALTREX) 1000 MG tablet Take 500 mg by mouth 2 (two) times daily.   verapamil (COVERA HS) 240 MG (CO) 24 hr tablet Take 240 mg by mouth 2 (two) times daily.    vitamin C (ASCORBIC ACID) 500 MG tablet Take 500 mg by mouth daily.     PAST MEDICAL HISTORY: Past Medical History:  Diagnosis Date   Allergic rhinitis    Back pain    Chest pain    Chronic headaches    "ice pick headaches"   Chronic pain disorder    From long-standing cervical spine disease - chronic pain management, history of   CKD (chronic kidney disease), stage II    Per PCP notes - Cr 0.9-1.3   Constipation    Depression    GERD (gastroesophageal reflux disease)    Hair loss    History of colon polyps 02/2014   Dr. MaLizbeth Bark Hyperlipemia    Hypertension    Joint pain    Lactose intolerance    Lower extremity edema    Morbid obesity with BMI of 45.0-49.9, adult (HCC)    Palpitations    Pre-diabetes    Sleep apnea    uses a cpap - Dr. YoAnnamaria Boots Swallowing difficulty    Vitamin D deficiency     PAST SURGICAL HISTORY: Past Surgical History:  Procedure Laterality Date  ABDOMINAL HYSTERECTOMY  1975   BSO   BREAST EXCISIONAL BIOPSY Left 05/12/2013   papilloma   BREAST EXCISIONAL BIOPSY Left 03/30/2013   SCLEROSING DUCTAL PAPILLOMA   BREAST LUMPECTOMY WITH NEEDLE LOCALIZATION Left 05/12/2013   Procedure: BREAST LUMPECTOMY WITH NEEDLE LOCALIZATION;  Surgeon: Marcello Moores A. Cornett, MD;  Location: [facility];  Service: General;  Laterality: Left;   [place]  2008   both cataracts   FOOT SURGERY  08-2011   Left   HAND SURGERY Right    LAPAROSCOPIC GASTRIC SLEEVE RESECTION N/A 04/30/2017   Procedure: LAPAROSCOPIC GASTRIC SLEEVE  RESECTION WITH UPPER ENDO;  Surgeon: Johnathan Hausen, MD;  Location: [facility];  Service: General;  Laterality: N/A;   right CTS     TUBAL LIGATION     VESICOVAGINAL FISTULA CLOSURE W/ TAH     vocal cord surgery  07/2017    FAMILY HISTORY: The patient family history includes Colon cancer in her maternal grandmother; Diabetes in her father and mother; Heart attack in her mother; Heart disease in her brother, mother, and sister; Hypertension in her father, mother, and sister; Kidney disease in her father and mother; Migraines in her brother; Prostate cancer in her brother and brother.  SOCIAL HISTORY:  The patient  reports that she has never smoked. She has never used smokeless tobacco. She reports that she does not drink alcohol and does not use drugs.  REVIEW OF SYSTEMS: Review of Systems  Constitutional: Negative for chills and fever.  HENT:  Negative for hoarse voice and nosebleeds.   Eyes:  Negative for discharge, double vision and pain.  Cardiovascular:  Negative for chest pain, claudication, dyspnea on exertion, leg swelling, near-syncope, orthopnea, palpitations, paroxysmal nocturnal dyspnea and syncope.  Respiratory:  Negative for hemoptysis and shortness of breath.   Musculoskeletal:  Negative for muscle cramps and myalgias.  Gastrointestinal:  Negative for abdominal pain, constipation, diarrhea, hematemesis, hematochezia, melena, nausea and vomiting.  Neurological:  Negative for dizziness and light-headedness.    PHYSICAL EXAM:    03/22/2022   11:33 AM 12/26/2021   11:00 AM 11/16/2021   10:16 AM  Vitals with BMI  Height _0  _1  _2   Weight 227 lbs 13 oz 222 lbs 222 lbs 6 oz  BMI 41.65 68.11 57.26  Systolic 203  559  Diastolic 77  62  Pulse 80  71   Physical Exam  Constitutional: No distress.  Age appropriate, hemodynamically stable.   Neck: No JVD present.  Cardiovascular: Normal rate, regular rhythm, S1 normal, S2 normal, intact distal pulses and normal pulses.  Exam reveals no gallop, no S3 and no S4.  No murmur heard. Pulmonary/Chest: Effort normal and breath sounds normal. No stridor. She has no wheezes. She has no rales.  Abdominal: Soft. Bowel sounds are normal. She exhibits no distension. There is no abdominal tenderness.  Musculoskeletal:        General: No edema.     Cervical back: Neck supple.  Neurological: She is alert and oriented to person, place, and time. She has intact cranial nerves (2-12).  Skin: Skin is warm and moist.    CARDIAC DATABASE: EKG: 03/22/2022: Normal sinus rhythm, 76 bpm, nonspecific T wave abnormality.  Echocardiogram: 03/15/2022:  Normal LV systolic function with visual EF 55-60%. Left ventricle cavity  is normal in size. Mild concentric hypertrophy of the left ventricle.  Normal global wall motion. Doppler evidence of grade I (impaired)  diastolic dysfunction, normal LAP. Calculated EF 56%.  Structurally normal mitral valve.  Mild (Grade I) mitral regurgitation.  Structurally normal tricuspid valve.  Moderate tricuspid regurgitation. No  evidence of pulmonary hypertension.  No significant change compared to 08/2020.    Stress Testing: No results found for this or any previous visit from the past 1095 days.  Heart Catheterization: None   14 day extended Holter monitor: Dominant rhythm normal sinus rhythm. Heart rate 51-128 bpm.  Avg HR 72 bpm. No atrial fibrillation, supraventricular tachycardia, nonsustained ventricular tachycardia, high grade AV block, pauses (3 seconds or longer). Total ventricular ectopic burden <1%. Total supraventricular ectopic burden <1%. Patient triggered events: 5.  Underlying rhythm normal sinus with the exception of one episode noted normal sinus with accelerated junctional rhythm (09/13/2020 at 7:31pm).  Carotid artery duplex 08/31/2021: Duplex suggests stenosis in the right internal carotid artery (1-15%). Duplex suggests stenosis in the right external carotid artery  (<50%).  Duplex suggests stenosis in the left internal carotid artery (1-15%). Duplex suggests stenosis in the left external carotid artery (<50%). Heterogeneous plaque noted in bilateral carotid bulbs. Antegrade right vertebral artery flow. Antegrade left vertebral artery flow. 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 Otherwise no significant change. Follow up is appropriate if clinically indicated. Findings correlate with CT findings on 10/05/2020.  Signed by Adrian Prows, MD on 09/02/2021 17:33   CTA head and neck: 10/05/2020: No acute intracranial abnormality. Calcified plaque at the right common carotid bifurcation and proximal internal carotid causing less than 50% stenosis. No measurable stenosis at the left ICA origin. Patent extracranial vertebral arteries without stenosis. No significant intracranial stenosis.  LABORATORY DATA:    Latest Ref Rng & Units 06/06/2020    9:47 PM 10/27/2019    1:34 PM 10/05/2019    1:38 PM  CBC  WBC 4.0 - 10.5 K/uL 8.2  4.5  5.4   Hemoglobin 12.0 - 15.0 g/dL 14.7  12.3  12.0   Hematocrit 36.0 - 46.0 % 44.8  37.9  37.2   Platelets 150 - 400 K/uL 185  196  190        Latest Ref Rng & Units 06/06/2020    9:47 PM 10/27/2019    1:34 PM 06/25/2019    8:11 AM  CMP  Glucose 70 - 99 mg/dL 105  81  81   BUN 8 - 23 mg/dL 36  30  32   Creatinine 0.44 - 1.00 mg/dL 0.96  1.08  1.15   Sodium 135 - 145 mmol/L 136  137  140   Potassium 3.5 - 5.1 mmol/L 4.2  4.6  4.7   Chloride 98 - 111 mmol/L 101  100  102   CO2 22 - 32 mmol/L _0 Calcium 8.9 - 10.3 mg/dL 9.5  10.0  9.9   Total Protein 6.5 - 8.1 g/dL 8.0  7.7    Total Bilirubin 0.3 - 1.2 mg/dL 1.1  0.5    Alkaline Phos 38 - 126 U/L 58  87    AST 15 - 41 U/L 24  22    ALT 0 - 44 U/L 25  14      Lipid Panel     Component Value Date/Time   CHOL 176 10/27/2019 1334   TRIG 49 10/27/2019 1334   HDL 75 10/27/2019 1334   LDLCALC 91 10/27/2019 1334   LABVLDL 10 10/27/2019 1334     No components found for: "NTPROBNP" No results  for input(s): "PROBNP" in the last 8760 hours. No results for input(s): "TSH" in the last 8760 hours.   BMP No results for input(s): "NA", "K", "CL", "CO2", "GLUCOSE", "BUN", "CREATININE", "CALCIUM", "GFRNONAA", "GFRAA" in the last 8760 hours.   HEMOGLOBIN A1C Lab Results  Component Value Date   HGBA1C 5.6 10/27/2019   External Labs:  Collected: 08/11/2020 provided by PCP office Hemoglobin 12.3 g/dL, hematocrit 36.9% Platelets 188,000 Creatinine 0.94 mg/dL. eGFR: 64 mL/min per 1.73 m Lipid profile: Total cholesterol 184, triglycerides 81, HDL 73, LDL 96, non-HDL 111  IMPRESSION:    ICD-10-CM   1. Nonrheumatic mitral valve regurgitation  I34.0     2. Atherosclerosis of aorta (HCC)  I70.0 EKG 12-Lead    3. Essential hypertension  I10     4. Obstructive sleep apnea on CPAP  G47.33     5. Mixed hyperlipidemia  E78.2        RECOMMENDATIONS: MELIYA MCCONAHY is a 76 y.o. female whose past medical history and cardiac risk factors include:  COVID-19 infection, aortic atherosclerosis, prediabetes, hypertension, sleep apnea on CPAP, hyperlipidemia, vitamin D deficiency, postmenopausal female, advanced age.  Nonrheumatic mitral valve regurgitation Most recent echocardiogram results reviewed.  Severity of MR has improved compared to prior studies. Reemphasized the importance of blood pressure management. Continue to monitor. No additional testing warranted at this time  Atherosclerosis of aorta (HCC) Currently on aspirin and statin therapy. Ischemic workup recently performed and outlined above. EKG: Nonischemic Monitor for now  Essential hypertension Office blood pressures are very well-controlled. Medications reconciled. Reemphasized the importance of low-salt diet. Patient currently takes chlorthalidone 25 mg 1.5 tabs per day. Recommended decreasing dose of chlorthalidone and the addition of either ACE inhibitors or ARB  (for renal protection given her history of prediabetes) Currently managed by primary care provider.  Mixed hyperlipidemia Currently on atorvastatin.   She denies myalgia or other side effects. Currently managed by primary care provider.  FINAL MEDICATION LIST END OF ENCOUNTER: No orders of the defined types were placed in this encounter.   Medications Discontinued During This Encounter  Medication Reason   losartan (COZAAR) 25 MG tablet Change in therapy     Current Outpatient Medications:    aspirin EC 81 MG tablet, Take 81 mg by mouth at bedtime., Disp: , Rfl:    atorvastatin (LIPITOR) 10 MG tablet, Take 10 mg by mouth at bedtime. , Disp: , Rfl:    Calcium Carbonate (CALCIUM 500 PO), Take 500 mg by mouth every other day., Disp: , Rfl:    chlorthalidone (HYGROTON) 25 MG tablet, Take 25 mg by mouth daily. Pt taking 1.5 tablets/daily, Disp: , Rfl:    Cholecalciferol (VITAMIN D3 PO), Take 5,000 Int'l Units by mouth every other day., Disp: , Rfl:    Multiple Vitamin (MULTIVITAMIN WITH MINERALS) TABS tablet, Take 1 tablet by mouth daily., Disp: , Rfl:    spironolactone (ALDACTONE) 25 MG tablet, Take 25 mg by mouth daily. Cutting tablet in half for 12.38m, Disp: , Rfl:    valACYclovir (VALTREX) 1000 MG tablet, Take 500 mg by mouth 2 (two) times daily., Disp: , Rfl:    verapamil (COVERA HS) 240 MG (CO) 24 hr tablet, Take 240 mg by mouth 2 (two) times daily. , Disp: , Rfl:    vitamin C (ASCORBIC ACID) 500 MG tablet, Take 500 mg by mouth daily., Disp: , Rfl:   Orders Placed This Encounter  Procedures   EKG 12-Lead     There are no Patient  Instructions on file for this visit.   --Continue cardiac medications as reconciled in final medication list. --Return in about 1 year (around 03/23/2023) for Annual follow up visit. Or sooner if needed. --Continue follow-up with your primary care physician regarding the management of your other chronic comorbid conditions.  Patient's questions and  concerns were addressed to her satisfaction. She voices understanding of the instructions provided during this encounter.   This note was created using a voice recognition software as a result there may be grammatical errors inadvertently enclosed that do not reflect the nature of this encounter. Every attempt is made to correct such errors.  Rex Kras, [place], [facility]  Pager: [phone] Office: [phone]

## 2022-03-23 DIAGNOSIS — Z0000 Encounter for general adult medical examination without abnormal findings: Secondary | ICD-10-CM | POA: Diagnosis not present

## 2022-03-23 DIAGNOSIS — Z1389 Encounter for screening for other disorder: Secondary | ICD-10-CM | POA: Diagnosis not present

## 2022-04-12 DIAGNOSIS — I700 Atherosclerosis of aorta: Secondary | ICD-10-CM | POA: Diagnosis not present

## 2022-04-12 DIAGNOSIS — R7303 Prediabetes: Secondary | ICD-10-CM | POA: Diagnosis not present

## 2022-04-12 DIAGNOSIS — E559 Vitamin D deficiency, unspecified: Secondary | ICD-10-CM | POA: Diagnosis not present

## 2022-04-12 DIAGNOSIS — Z6841 Body Mass Index (BMI) 40.0 and over, adult: Secondary | ICD-10-CM | POA: Diagnosis not present

## 2022-04-12 DIAGNOSIS — I10 Essential (primary) hypertension: Secondary | ICD-10-CM | POA: Diagnosis not present

## 2022-04-12 DIAGNOSIS — N1831 Chronic kidney disease, stage 3a: Secondary | ICD-10-CM | POA: Diagnosis not present

## 2022-04-12 DIAGNOSIS — Z0000 Encounter for general adult medical examination without abnormal findings: Secondary | ICD-10-CM | POA: Diagnosis not present

## 2022-04-12 DIAGNOSIS — E785 Hyperlipidemia, unspecified: Secondary | ICD-10-CM | POA: Diagnosis not present

## 2022-04-22 NOTE — Progress Notes (Unsigned)
HPI F never smoker seen for OSA  Insomnia, complicated by morbid obesity/ gastric sleeve bariatric surgery, HBP, Upper Airway Cough, GERD,  NPSG 05/23/12- AHI 42.6 per hour. Weight 267 pounds Office spirometry 04/30/18- .WNL --------------------------------------------------------------------   11/16/21- 77 year old female never smoker followed for OSA, Insomnia, complicated by morbid obesity/ gastric sleeve bariatric surgery, HTN, Upper Airway Cough, GERD, Covid infection Jan 2021,  CPAP auto 8-12/APS/ [facility]     AirSense 10 AutoSet Download-compliance  0%, AHI 12.8/ hr Body weight today-222  lbs Covid vax- 4 Phizer ------Pt f/u for OSA, pt states that the mask isn't getting a good seal and she's waking up have to readjust constantly. Pt says she is getting about 5-6hrs/night w/o her mask. Also feels like she's still not getting enough oxygen. Download reviewed.  She actually does not know about oxygenation but is complaining of waking in the morning with dry mucus in her mouth.  We discussed adjusting humidifier, refitting mask and possible trial of Biotene.  She would like to get a DME company closer and more convenient to her than [place]. We cannot get her more comfortable with CPAP it may be appropriate now to update her sleep study.  04/24/22- 77 year old female never smoker followed for OSA, Insomnia, complicated by morbid obesity/ gastric sleeve bariatric surgery, HTN, Upper Airway Cough, GERD, Covid infection Jan 2021, Mitral Valve Regurgitation,  CPAP auto 10-20/APS/ [facility]     AirSense 10 AutoSet Download-compliance  80%, AHI 10/ hr      obstructive = central       pressure ranging 11.9- 15.7 Body weight today-233 lbs Covid vax- 5 Phizer Flu vax-had RSV vax- had Download reviewed.  Mask leaks around face although not showing on download. May explain high AHI but she says she is sleeping well now with CPAP and seems pleased. Denies other concerns. Changed [facility] office to one in  [place]. Recently tripped over chair at home, hurt arm.  ROS-see HPI   + = positive Constitutional:   +   weight loss, night sweats, fevers, chills, fatigue, lassitude. HEENT:   No-  headaches, difficulty swallowing, tooth/dental problems, sore throat,       No-  sneezing, itching, ear ache, +nasal congestion, post nasal drip,  CV:  No-   chest pain, orthopnea, PND, swelling in lower extremities, anasarca, dizziness, palpitations Resp: +shortness of breath with exertion or at rest.       No-   productive cough,  No non-productive cough,  No- coughing up of blood.              No-   change in color of mucus.  No- wheezing.   Skin: No-   rash or lesions. GI:  No-   heartburn, indigestion, abdominal pain, nausea, vomiting,  GU:  MS:  +  joint pain or swelling.   Neuro-     nothing unusual Psych:  No- change in mood or affect. No depression or anxiety.  No memory loss.  OBJ- Physical Exam General- Alert, Oriented, Affect-appropriate, Distress- none acute. + Obese  Skin- rash-none, lesions- none, excoriation- none Lymphadenopathy- none Head- atraumatic            Eyes- Gross vision intact, PERRLA, conjunctivae and secretions clear            Ears- Hearing, canals-normal            Nose- Clear, no-Septal dev, mucus, polyps, erosion, perforation             Throat- Mallampati  II-III , mucosa clear , drainage- none, tonsils- atrophic, +teeth Neck- flexible , trachea midline, no stridor , thyroid nl, carotid no bruit Chest - symmetrical excursion , unlabored           Heart/CV- RRR , no murmur , no gallop  , no rub, nl s1 s2                           - JVD- none , edema- none, stasis changes- none, varices- none           Lung- clear to P&A, wheeze- none, cough-none, dullness-none, rub- none           Chest wall-  Abd-  Br/ Gen/ Rectal- Not done, not indicated Extrem- cyanosis- none, clubbing, none, atrophy- none, strength- nl Neuro- grossly intact to observation

## 2022-04-24 ENCOUNTER — Ambulatory Visit: Payer: Medicare PPO | Admitting: Internal Medicine

## 2022-04-24 ENCOUNTER — Encounter: Payer: Self-pay | Admitting: Internal Medicine

## 2022-04-24 VITALS — BP 132/64 | HR 95 | Ht 62.0 in | Wt 233.4 lb

## 2022-04-24 DIAGNOSIS — G4733 Obstructive sleep apnea (adult) (pediatric): Secondary | ICD-10-CM

## 2022-04-24 NOTE — Patient Instructions (Signed)
Order- DME [facility]- please refit mask for better seal. Continue auto 10-20, mask of choice, humidifier, supplies, AirView/ card  Please call if we can help

## 2022-04-24 NOTE — Assessment & Plan Note (Signed)
Needs to work harder at this

## 2022-04-24 NOTE — Assessment & Plan Note (Signed)
Benefits from CPAP. Compliance good. AHI higher than desired, may reflect mask leak she reports. Plan- refit mask. Continuee auto 10-20

## 2022-04-26 DIAGNOSIS — M19011 Primary osteoarthritis, right shoulder: Secondary | ICD-10-CM | POA: Diagnosis not present

## 2022-04-26 DIAGNOSIS — M25511 Pain in right shoulder: Secondary | ICD-10-CM | POA: Diagnosis not present

## 2022-05-03 ENCOUNTER — Telehealth: Payer: Self-pay | Admitting: Internal Medicine

## 2022-05-03 NOTE — Telephone Encounter (Signed)
Order was placed 04/24/22. Routing to [facility] for assistance with this.

## 2022-05-03 NOTE — Telephone Encounter (Signed)
Patient called to inform the nurse that she still has not heard from her supplier, [facility] [facility], for her cpap.  Please advise.  CB# [phone]

## 2022-05-04 NOTE — Telephone Encounter (Signed)
Hi, Raven.   Please determine the status of this order.   Thanks!

## 2022-05-07 ENCOUNTER — Other Ambulatory Visit: Payer: Self-pay | Admitting: Family Medicine

## 2022-05-07 DIAGNOSIS — Z1231 Encounter for screening mammogram for malignant neoplasm of breast: Secondary | ICD-10-CM

## 2022-05-16 ENCOUNTER — Telehealth: Payer: Self-pay | Admitting: Internal Medicine

## 2022-05-16 NOTE — Telephone Encounter (Signed)
PT wants to leave msg for [place]. Please call. It concerns her CPAP. Wants to find out when she will be eligable to get a new Cpap. [phone]

## 2022-05-17 ENCOUNTER — Telehealth: Payer: Self-pay | Admitting: Internal Medicine

## 2022-05-17 DIAGNOSIS — G4733 Obstructive sleep apnea (adult) (pediatric): Secondary | ICD-10-CM

## 2022-05-17 NOTE — Telephone Encounter (Signed)
New order placed for cpap to see if [facility] can service old machine or give new one. Called patient and updated her that we were sending in order for her cpap machine.

## 2022-05-17 NOTE — Telephone Encounter (Signed)
Called and spoke with patient and she states that her cpap machine is making a lot of weird noises and she is wondering if she can get a new machine> I told her to call [facility] to ask them when she got her current cpap and if she is eligible for a new machine we can place order for her. She states she will call [facility] and find out. Nothing further needed

## 2022-05-17 NOTE — Telephone Encounter (Signed)
Called patient and she states that her cpap machine is making horrible noises and she called [facility] and they told her she is eligible for a new machine in August. She states she fears her machine is not working well for her apnea since it is making all these noises.   Sir and suggestions?  Please advise

## 2022-05-17 NOTE — Telephone Encounter (Signed)
Order- DME [facility]- please service or replace old CPAP making abnormal noise. Continue present settings, AirView/ card, supplies and humidifier.

## 2022-06-06 DIAGNOSIS — G4733 Obstructive sleep apnea (adult) (pediatric): Secondary | ICD-10-CM | POA: Diagnosis not present

## 2022-06-28 ENCOUNTER — Ambulatory Visit
Admission: RE | Admit: 2022-06-28 | Discharge: 2022-06-28 | Disposition: A | Discharge status: Home / Self Care | Payer: Medicare PPO | Source: Ambulatory Visit | Attending: Family Medicine | Admitting: Family Medicine

## 2022-06-28 DIAGNOSIS — Z1231 Encounter for screening mammogram for malignant neoplasm of breast: Secondary | ICD-10-CM | POA: Diagnosis not present

## 2022-08-17 DIAGNOSIS — M109 Gout, unspecified: Secondary | ICD-10-CM | POA: Diagnosis not present

## 2022-08-30 DIAGNOSIS — G4733 Obstructive sleep apnea (adult) (pediatric): Secondary | ICD-10-CM | POA: Diagnosis not present

## 2022-09-08 DIAGNOSIS — R52 Pain, unspecified: Secondary | ICD-10-CM | POA: Diagnosis not present

## 2022-09-22 DIAGNOSIS — R21 Rash and other nonspecific skin eruption: Secondary | ICD-10-CM | POA: Diagnosis not present

## 2022-10-04 DIAGNOSIS — L668 Other cicatricial alopecia: Secondary | ICD-10-CM | POA: Diagnosis not present

## 2022-10-04 DIAGNOSIS — L299 Pruritus, unspecified: Secondary | ICD-10-CM | POA: Diagnosis not present

## 2022-10-09 DIAGNOSIS — G4730 Sleep apnea, unspecified: Secondary | ICD-10-CM | POA: Diagnosis not present

## 2022-10-09 DIAGNOSIS — M545 Low back pain, unspecified: Secondary | ICD-10-CM | POA: Diagnosis not present

## 2022-10-09 DIAGNOSIS — N189 Chronic kidney disease, unspecified: Secondary | ICD-10-CM | POA: Diagnosis not present

## 2022-10-09 DIAGNOSIS — E785 Hyperlipidemia, unspecified: Secondary | ICD-10-CM | POA: Diagnosis not present

## 2022-10-09 DIAGNOSIS — I129 Hypertensive chronic kidney disease with stage 1 through stage 4 chronic kidney disease, or unspecified chronic kidney disease: Secondary | ICD-10-CM | POA: Diagnosis not present

## 2022-10-09 DIAGNOSIS — G629 Polyneuropathy, unspecified: Secondary | ICD-10-CM | POA: Diagnosis not present

## 2022-10-09 DIAGNOSIS — M1990 Unspecified osteoarthritis, unspecified site: Secondary | ICD-10-CM | POA: Diagnosis not present

## 2022-10-09 DIAGNOSIS — R32 Unspecified urinary incontinence: Secondary | ICD-10-CM | POA: Diagnosis not present

## 2022-10-09 DIAGNOSIS — J301 Allergic rhinitis due to pollen: Secondary | ICD-10-CM | POA: Diagnosis not present

## 2022-10-11 DIAGNOSIS — R7303 Prediabetes: Secondary | ICD-10-CM | POA: Diagnosis not present

## 2022-10-11 DIAGNOSIS — I5189 Other ill-defined heart diseases: Secondary | ICD-10-CM | POA: Diagnosis not present

## 2022-10-11 DIAGNOSIS — I10 Essential (primary) hypertension: Secondary | ICD-10-CM | POA: Diagnosis not present

## 2022-10-11 DIAGNOSIS — R3915 Urgency of urination: Secondary | ICD-10-CM | POA: Diagnosis not present

## 2022-10-11 DIAGNOSIS — N1831 Chronic kidney disease, stage 3a: Secondary | ICD-10-CM | POA: Diagnosis not present

## 2022-10-11 DIAGNOSIS — Z6841 Body Mass Index (BMI) 40.0 and over, adult: Secondary | ICD-10-CM | POA: Diagnosis not present

## 2022-10-11 DIAGNOSIS — E785 Hyperlipidemia, unspecified: Secondary | ICD-10-CM | POA: Diagnosis not present

## 2022-10-11 DIAGNOSIS — I700 Atherosclerosis of aorta: Secondary | ICD-10-CM | POA: Diagnosis not present

## 2022-11-06 DIAGNOSIS — Z6841 Body Mass Index (BMI) 40.0 and over, adult: Secondary | ICD-10-CM | POA: Diagnosis not present

## 2022-11-06 DIAGNOSIS — M542 Cervicalgia: Secondary | ICD-10-CM | POA: Diagnosis not present

## 2022-11-06 DIAGNOSIS — R519 Headache, unspecified: Secondary | ICD-10-CM | POA: Diagnosis not present

## 2022-11-06 DIAGNOSIS — I5189 Other ill-defined heart diseases: Secondary | ICD-10-CM | POA: Diagnosis not present

## 2022-11-06 DIAGNOSIS — G5602 Carpal tunnel syndrome, left upper limb: Secondary | ICD-10-CM | POA: Diagnosis not present

## 2022-11-15 ENCOUNTER — Encounter (HOSPITAL_COMMUNITY): Payer: Self-pay | Admitting: *Deleted

## 2022-12-06 DIAGNOSIS — Z6841 Body Mass Index (BMI) 40.0 and over, adult: Secondary | ICD-10-CM | POA: Diagnosis not present

## 2022-12-06 DIAGNOSIS — R1032 Left lower quadrant pain: Secondary | ICD-10-CM | POA: Diagnosis not present

## 2022-12-06 DIAGNOSIS — M25511 Pain in right shoulder: Secondary | ICD-10-CM | POA: Diagnosis not present

## 2022-12-06 DIAGNOSIS — Z9181 History of falling: Secondary | ICD-10-CM | POA: Diagnosis not present

## 2022-12-06 DIAGNOSIS — G8929 Other chronic pain: Secondary | ICD-10-CM | POA: Diagnosis not present

## 2022-12-06 DIAGNOSIS — M7541 Impingement syndrome of right shoulder: Secondary | ICD-10-CM | POA: Diagnosis not present

## 2022-12-13 ENCOUNTER — Telehealth: Payer: Self-pay | Admitting: Internal Medicine

## 2022-12-14 NOTE — Telephone Encounter (Signed)
Left message for patient to have [facility] reach out to [place] as we had placed an order in February for CPAP.

## 2022-12-18 ENCOUNTER — Telehealth: Payer: Self-pay | Admitting: Internal Medicine

## 2022-12-18 DIAGNOSIS — G4733 Obstructive sleep apnea (adult) (pediatric): Secondary | ICD-10-CM

## 2022-12-18 NOTE — Telephone Encounter (Signed)
Patient needs Dr.Young to put in a prescription to [facility] in order for her to receive her CPAP.

## 2023-01-11 DIAGNOSIS — J101 Influenza due to other identified influenza virus with other respiratory manifestations: Secondary | ICD-10-CM | POA: Diagnosis not present

## 2023-01-11 DIAGNOSIS — G43909 Migraine, unspecified, not intractable, without status migrainosus: Secondary | ICD-10-CM | POA: Diagnosis not present

## 2023-01-11 DIAGNOSIS — R059 Cough, unspecified: Secondary | ICD-10-CM | POA: Diagnosis not present

## 2023-01-11 DIAGNOSIS — R519 Headache, unspecified: Secondary | ICD-10-CM | POA: Diagnosis not present

## 2023-01-11 DIAGNOSIS — R0989 Other specified symptoms and signs involving the circulatory and respiratory systems: Secondary | ICD-10-CM | POA: Diagnosis not present

## 2023-01-11 DIAGNOSIS — U071 COVID-19: Secondary | ICD-10-CM | POA: Diagnosis not present

## 2023-01-11 DIAGNOSIS — E785 Hyperlipidemia, unspecified: Secondary | ICD-10-CM | POA: Diagnosis not present

## 2023-01-11 DIAGNOSIS — I10 Essential (primary) hypertension: Secondary | ICD-10-CM | POA: Diagnosis not present

## 2023-01-16 DIAGNOSIS — G4733 Obstructive sleep apnea (adult) (pediatric): Secondary | ICD-10-CM | POA: Diagnosis not present

## 2023-01-23 DIAGNOSIS — R0602 Shortness of breath: Secondary | ICD-10-CM | POA: Diagnosis not present

## 2023-01-23 DIAGNOSIS — E785 Hyperlipidemia, unspecified: Secondary | ICD-10-CM | POA: Diagnosis not present

## 2023-01-23 DIAGNOSIS — I10 Essential (primary) hypertension: Secondary | ICD-10-CM | POA: Diagnosis not present

## 2023-01-23 DIAGNOSIS — Z6841 Body Mass Index (BMI) 40.0 and over, adult: Secondary | ICD-10-CM | POA: Diagnosis not present

## 2023-03-12 DIAGNOSIS — M7061 Trochanteric bursitis, right hip: Secondary | ICD-10-CM | POA: Diagnosis not present

## 2023-03-12 DIAGNOSIS — Z6841 Body Mass Index (BMI) 40.0 and over, adult: Secondary | ICD-10-CM | POA: Diagnosis not present

## 2023-03-26 ENCOUNTER — Ambulatory Visit: Payer: Self-pay | Admitting: Cardiology

## 2023-03-28 DIAGNOSIS — I10 Essential (primary) hypertension: Secondary | ICD-10-CM | POA: Diagnosis not present

## 2023-03-28 DIAGNOSIS — I5189 Other ill-defined heart diseases: Secondary | ICD-10-CM | POA: Diagnosis not present

## 2023-03-28 DIAGNOSIS — N1831 Chronic kidney disease, stage 3a: Secondary | ICD-10-CM | POA: Diagnosis not present

## 2023-03-28 DIAGNOSIS — Z23 Encounter for immunization: Secondary | ICD-10-CM | POA: Diagnosis not present

## 2023-03-28 DIAGNOSIS — E785 Hyperlipidemia, unspecified: Secondary | ICD-10-CM | POA: Diagnosis not present

## 2023-03-28 DIAGNOSIS — M791 Myalgia, unspecified site: Secondary | ICD-10-CM | POA: Diagnosis not present

## 2023-03-28 DIAGNOSIS — E559 Vitamin D deficiency, unspecified: Secondary | ICD-10-CM | POA: Diagnosis not present

## 2023-03-28 DIAGNOSIS — Z6841 Body Mass Index (BMI) 40.0 and over, adult: Secondary | ICD-10-CM | POA: Diagnosis not present

## 2023-03-28 DIAGNOSIS — J302 Other seasonal allergic rhinitis: Secondary | ICD-10-CM | POA: Diagnosis not present

## 2023-03-28 DIAGNOSIS — R7303 Prediabetes: Secondary | ICD-10-CM | POA: Diagnosis not present

## 2023-03-28 DIAGNOSIS — Z0000 Encounter for general adult medical examination without abnormal findings: Secondary | ICD-10-CM | POA: Diagnosis not present

## 2023-03-28 DIAGNOSIS — I700 Atherosclerosis of aorta: Secondary | ICD-10-CM | POA: Diagnosis not present

## 2023-03-29 ENCOUNTER — Encounter: Payer: Self-pay | Admitting: Cardiology

## 2023-03-29 ENCOUNTER — Ambulatory Visit: Payer: Medicare PPO | Attending: Cardiology | Admitting: Cardiology

## 2023-03-29 VITALS — BP 140/74 | HR 85 | Resp 16 | Ht 62.0 in | Wt 235.8 lb

## 2023-03-29 DIAGNOSIS — I10 Essential (primary) hypertension: Secondary | ICD-10-CM

## 2023-03-29 DIAGNOSIS — E782 Mixed hyperlipidemia: Secondary | ICD-10-CM

## 2023-03-29 DIAGNOSIS — I700 Atherosclerosis of aorta: Secondary | ICD-10-CM

## 2023-03-29 DIAGNOSIS — G4733 Obstructive sleep apnea (adult) (pediatric): Secondary | ICD-10-CM

## 2023-03-29 DIAGNOSIS — I340 Nonrheumatic mitral (valve) insufficiency: Secondary | ICD-10-CM | POA: Diagnosis not present

## 2023-03-29 NOTE — Patient Instructions (Signed)

## 2023-03-29 NOTE — Progress Notes (Signed)
Cardiology Office Note:  .   Date:  03/29/2023  ID:  Beth Sullivan, DOB 1945/09/09, MRN [id number] PCP:  Soundra Pilon, FNP  Former Cardiology Providers: N/A [facility] Providers Cardiologist:  Tessa Lerner, DO , [facility] (established care 08/25/2020) Electrophysiologist:  None  Click to update primary MD,subspecialty MD or APP then REFRESH:1}    Chief Complaint  Patient presents with   Nonrheumatic mitral valve regurgitation   Follow-up    History of Present Illness: .   Beth Sullivan is a 77 y.o. African-American female whose past medical history and cardiovascular risk factors includes: COVID-19 infection, aortic atherosclerosis, prediabetes, hypertension, sleep apnea on CPAP, hyperlipidemia, vitamin D deficiency, postmenopausal female, advanced age.   Patient presents today for 1 year follow-up visit.  She is being followed given her history of mitral regurgitation.  Over the last 1 year she denies any anginal chest pain or heart failure symptoms.  Family history of premature coronary artery disease. Mom had her first myocardial infarction in her mid 75s. Sister had a heart attack about the age of 36.   Review of Systems: .   Review of Systems  Cardiovascular:  Negative for chest pain, claudication, irregular heartbeat, leg swelling, near-syncope, orthopnea, palpitations, paroxysmal nocturnal dyspnea and syncope.  Respiratory:  Negative for shortness of breath.   Hematologic/Lymphatic: Negative for bleeding problem.    Studies Reviewed:   EKG: EKG Interpretation Date/Time:  Friday March 29 2023 08:55:30 EST Text Interpretation: Normal sinus rhythm Minimal voltage criteria for LVH, may be normal variant ( R in aVL ) Nonspecific T wave abnormality When compared with ECG of 07-Jun-2020 04:14, QT has shortened Confirmed by Tessa Lerner [phone]) on 03/29/2023 9:03:44 AM  Echocardiogram: 03/15/2022:  Normal LV systolic function with visual EF 55-60%. Left ventricle  cavity  is normal in size. Mild concentric hypertrophy of the left ventricle.  Normal global wall motion. Doppler evidence of grade I (impaired) diastolic dysfunction, normal LAP. Calculated EF 56%.  Structurally normal mitral valve.  Mild (Grade I) mitral regurgitation.  Structurally normal tricuspid valve.  Moderate tricuspid regurgitation. No evidence of pulmonary hypertension.  No significant change compared to 08/2020.   Carotid artery duplex 08/31/2021: Duplex suggests stenosis in the right internal carotid artery (1-15%). Duplex suggests stenosis in the right external carotid artery (<50%).  Duplex suggests stenosis in the left internal carotid artery (1-15%). Duplex suggests stenosis in the left external carotid artery (<50%). Heterogeneous plaque noted in bilateral carotid bulbs. Antegrade right vertebral artery flow. Antegrade left vertebral artery flow.  Cardiac monitor: 14 day extended Holter monitor: Dominant rhythm normal sinus rhythm. Heart rate 51-128 bpm.  Avg HR 72 bpm. No atrial fibrillation, supraventricular tachycardia, nonsustained ventricular tachycardia, high grade AV block, pauses (3 seconds or longer). Total ventricular ectopic burden <1%. Total supraventricular ectopic burden <1%. Patient triggered events: 5.  Underlying rhythm normal sinus with the exception of one episode noted normal sinus with accelerated junctional rhythm (09/13/2020 at 7:31pm).  RADIOLOGY: CTA head and neck: 10/05/2020: No acute intracranial abnormality. Calcified plaque at the right common carotid bifurcation and proximal internal carotid causing less than 50% stenosis. No measurable stenosis at the left ICA origin. Patent extracranial vertebral arteries without stenosis. No significant intracranial stenosis.  Risk Assessment/Calculations:   NA   Labs:       Latest Ref Rng & Units 06/06/2020    9:47 PM 10/27/2019    1:34 PM 10/05/2019    1:38 PM  CBC  WBC 4.0 -  10.5 K/uL 8.2  4.5   5.4   Hemoglobin 12.0 - 15.0 g/dL 53.6  64.4  03.4   Hematocrit 36.0 - 46.0 % 44.8  37.9  37.2   Platelets 150 - 400 K/uL 185  196  190        Latest Ref Rng & Units 06/06/2020    9:47 PM 10/27/2019    1:34 PM 06/25/2019    8:11 AM  BMP  Glucose 70 - 99 mg/dL 742  81  81   BUN 8 - 23 mg/dL 36  30  32   Creatinine 0.44 - 1.00 mg/dL 5.95  6.38  7.56   BUN/Creat Ratio 12 - 28  28  28    Sodium 135 - 145 mmol/L 136  137  140   Potassium 3.5 - 5.1 mmol/L 4.2  4.6  4.7   Chloride 98 - 111 mmol/L 101  100  102   CO2 22 - 32 mmol/L 23  22  24    Calcium 8.9 - 10.3 mg/dL 9.5  43.3  9.9       Latest Ref Rng & Units 06/06/2020    9:47 PM 10/27/2019    1:34 PM 06/25/2019    8:11 AM  CMP  Glucose 70 - 99 mg/dL 295  81  81   BUN 8 - 23 mg/dL 36  30  32   Creatinine 0.44 - 1.00 mg/dL 1.88  4.16  6.06   Sodium 135 - 145 mmol/L 136  137  140   Potassium 3.5 - 5.1 mmol/L 4.2  4.6  4.7   Chloride 98 - 111 mmol/L 101  100  102   CO2 22 - 32 mmol/L 23  22  24    Calcium 8.9 - 10.3 mg/dL 9.5  30.1  9.9   Total Protein 6.5 - 8.1 g/dL 8.0  7.7    Total Bilirubin 0.3 - 1.2 mg/dL 1.1  0.5    Alkaline Phos 38 - 126 U/L 58  87    AST 15 - 41 U/L 24  22    ALT 0 - 44 U/L 25  14      Lab Results  Component Value Date   CHOL 176 10/27/2019   HDL 75 10/27/2019   LDLCALC 91 10/27/2019   TRIG 49 10/27/2019   No results for input(s): "LIPOA" in the last 8760 hours. No components found for: "NTPROBNP" No results for input(s): "PROBNP" in the last 8760 hours. No results for input(s): "TSH" in the last 8760 hours.  External Labs: Collected: December 2024 available in Care Everywhere Hemoglobin 14.3, hematocrit 44.2%. Platelets 211 BUN 18, creatinine 0.98. eGFR 60. Sodium 139, potassium 3.7, chloride 98, bicarb 32 AST, ALT, alkaline phosphatase within normal limits. Hemoglobin A1c 6.0. Total cholesterol 203, triglycerides 114, HDL 83, LDL 101  Physical Exam:    Today's Vitals   03/29/23 0851   BP: (!) 140/74  Pulse: 85  Resp: 16  SpO2: 97%  Weight: 235 lb 12.8 oz (107 kg)  Height: 5\' 2"  (1.575 m)   Body mass index is 43.13 kg/m. Wt Readings from Last 3 Encounters:  03/29/23 235 lb 12.8 oz (107 kg)  04/24/22 233 lb 6.4 oz (105.9 kg)  03/22/22 227 lb 12.8 oz (103.3 kg)    Physical Exam  Constitutional: No distress.  Age appropriate, hemodynamically stable.   Neck: No JVD present.  Cardiovascular: Normal rate, regular rhythm, S1 normal, S2 normal, intact distal pulses and normal pulses. Exam reveals no gallop,  no S3 and no S4.  No murmur heard. Pulmonary/Chest: Effort normal and breath sounds normal. No stridor. She has no wheezes. She has no rales.  Abdominal: Soft. Bowel sounds are normal. She exhibits no distension. There is no abdominal tenderness.  Musculoskeletal:        General: No edema.     Cervical back: Neck supple.  Neurological: She is alert and oriented to person, place, and time. She has intact cranial nerves (2-12).  Skin: Skin is warm and moist.     Impression & Recommendation(s):  Impression:   ICD-10-CM   1. Nonrheumatic mitral valve regurgitation  I34.0 EKG 12-Lead    2. Atherosclerosis of aorta (HCC)  I70.0     3. Essential hypertension  I10     4. Obstructive sleep apnea on CPAP  G47.33     5. Mixed hyperlipidemia  E78.2        Recommendation(s):  Nonrheumatic mitral valve regurgitation With improvement in her blood pressures in the past and the severity of mitral regurgitation has improved on serial studies.  Clinically she is asymptomatic.  And on physical examination no significant MR murmur. Reemphasized importance of blood pressure management I will likely order an echocardiogram at the next office visit to reevaluate late valvular heart disease.  Atherosclerosis of aorta (HCC) Mixed hyperlipidemia Continue aspirin 81 mg p.o. daily. Currently on Lipitor 10 mg p.o. daily. Her most recent lipid profile from PCPs office was  reviewed via Care Everywhere.   LDL levels are acceptable but not at goal. Would recommend a target LDL closer to 70 mg/dL if possible with up titration of medical therapy.   Patient will await to hear back from PCP for further guidance  Essential hypertension Office blood pressures are acceptable but not at goal. Patient states that home blood pressures are better controlled.  Per patient around 130 mmHg Reemphasized importance of low-salt diet.  As part of today's office visit discussed management of at least 2 chronic comorbid conditions, independently performed and interpreted EKG, independently reviewed labs from PCPs office from 03/28/2023 via Care Everywhere, and recommendations as noted above.  Orders Placed:  Orders Placed This Encounter  Procedures   EKG 12-Lead   Final Medication List:   No orders of the defined types were placed in this encounter.   There are no discontinued medications.   Current Outpatient Medications:    aspirin EC 81 MG tablet, Take 81 mg by mouth at bedtime., Disp: , Rfl:    atorvastatin (LIPITOR) 10 MG tablet, Take 10 mg by mouth at bedtime. , Disp: , Rfl:    Calcium Carbonate (CALCIUM 500 PO), Take 500 mg by mouth every other day., Disp: , Rfl:    chlorthalidone (HYGROTON) 25 MG tablet, Take 25 mg by mouth daily. Pt taking 1.5 tablets/daily, Disp: , Rfl:    Cholecalciferol (VITAMIN D3 PO), Take 5,000 Int'l Units by mouth every other day., Disp: , Rfl:    finasteride (PROSCAR) 5 MG tablet, Take 5 mg by mouth daily. 1/2 tablet daily, Disp: , Rfl:    Multiple Vitamin (MULTIVITAMIN WITH MINERALS) TABS tablet, Take 1 tablet by mouth daily., Disp: , Rfl:    spironolactone (ALDACTONE) 25 MG tablet, Take 25 mg by mouth daily. Cutting tablet in half for 12.5mg , Disp: , Rfl:    valACYclovir (VALTREX) 1000 MG tablet, Take 500 mg by mouth 2 (two) times daily., Disp: , Rfl:    verapamil (COVERA HS) 240 MG (CO) 24 hr tablet, Take 240 mg  by mouth 2 (two) times  daily. , Disp: , Rfl:    vitamin C (ASCORBIC ACID) 500 MG tablet, Take 500 mg by mouth daily., Disp: , Rfl:   Consent:   N/A  Disposition:   1 year follow-up sooner if needed Patient may be asked to follow-up sooner based on the results of the above-mentioned testing.  Her questions and concerns were addressed to her satisfaction. She voices understanding of the recommendations provided during this encounter.    Signed, Tessa Lerner, DO, [facility]  [facility]  [address] [place], [place] [address] 03/29/2023 11:43 AM

## 2023-04-07 NOTE — Progress Notes (Unsigned)
HPI F never smoker seen for OSA  Insomnia, complicated by morbid obesity/ gastric sleeve bariatric surgery, HBP, Upper Airway Cough, GERD,  NPSG 05/23/12- AHI 42.6 per hour. Weight 267 pounds Office spirometry 04/30/18- .WNL --------------------------------------------------------------------   04/24/22- 77 year old female never smoker followed for OSA, Insomnia, complicated by morbid obesity/ gastric sleeve bariatric surgery, HTN, Upper Airway Cough, GERD, Covid infection Jan 2021, Mitral Valve Regurgitation,  CPAP auto 10-20/APS/ [facility]     AirSense 10 AutoSet Download-compliance  80%, AHI 10/ hr      obstructive = central       pressure ranging 11.9- 15.7 Body weight today-233 lbs Covid vax- 5 Phizer Flu vax-had RSV vax- had Download reviewed.  Mask leaks around face although not showing on download. May explain high AHI but she says she is sleeping well now with CPAP and seems pleased. Denies other concerns. Changed [facility] office to one in [place]. Recently tripped over chair at home, hurt arm.  04/08/23- 77 year old female never smoker followed for OSA, Insomnia, complicated by morbid obesity/ gastric sleeve bariatric surgery, HTN, Upper Airway Cough, GERD, Covid infection Jan 2021, Mitral Valve Regurgitation,  CPAP auto 10-20/APS/ [facility]     AirSense 10 AutoSet Download-compliance  73%, AHI 9/hr     (pressure range 11.9-15.1)   central > obstructive      frequent missed nights Body weight today-   235lbs Discussed the use of AI scribe software for clinical note transcription with the patient, who gave verbal consent to proceed.  History of Present Illness   The patient, with a history of sleep apnea, presents for a CPAP machine replacement. She reports that her current machine is noisy and has caused her to miss several nights of therapy. Despite this, she reports that she sleeps better when she uses the machine and can get between four to six hours of sleep. She also reports that she  sometimes does not take the machine with her when she is not home.  In addition to her sleep apnea, the patient reports no changes in her breathing and denies any new cardiac symptoms. She also reports a persistent issue with a noisy CPAP machine that has led to missed nights of therapy.     ROS-see HPI   + = positive Constitutional:   +   weight loss, night sweats, fevers, chills, fatigue, lassitude. HEENT:   No-  headaches, difficulty swallowing, tooth/dental problems, sore throat,       No-  sneezing, itching, ear ache, +nasal congestion, post nasal drip,  CV:  No-   chest pain, orthopnea, PND, swelling in lower extremities, anasarca, dizziness, palpitations Resp: +shortness of breath with exertion or at rest.       No-   productive cough,  No non-productive cough,  No- coughing up of blood.              No-   change in color of mucus.  No- wheezing.   Skin: No-   rash or lesions. GI:  No-   heartburn, indigestion, abdominal pain, nausea, vomiting,  GU:  MS:  +  joint pain or swelling.   Neuro-     nothing unusual Psych:  No- change in mood or affect. No depression or anxiety.  No memory loss.  OBJ- Physical Exam General- Alert, Oriented, Affect-appropriate, Distress- none acute. + Obese  Skin- rash-none, lesions- none, excoriation- none Lymphadenopathy- none Head- atraumatic            Eyes- Gross vision intact, PERRLA, conjunctivae  and secretions clear            Ears- Hearing, canals-normal            Nose- Clear, no-Septal dev, mucus, polyps, erosion, perforation             Throat- Mallampati II-III , mucosa clear , drainage- none, tonsils- atrophic, +teeth Neck- flexible , trachea midline, no stridor , thyroid nl, carotid no bruit Chest - symmetrical excursion , unlabored           Heart/CV- RRR , no murmur , no gallop  , no rub, nl s1 s2                           - JVD- none , edema- none, stasis changes- none, varices- none           Lung- clear to P&A, wheeze- none,  cough-none, dullness-none, rub- none           Chest wall-  Abd-  Br/ Gen/ Rectal- Not done, not indicated Extrem- cyanosis- none, clubbing, none, atrophy- none, strength- nl Neuro- grossly intact to observation  Assessment and Plan    Obstructive Sleep Apnea Patient reports old CPAP machine is noisy and has missed several nights of use. Patient reports better sleep with CPAP use. -Order replacement CPAP machine with same settings. -Schedule follow-up appointment within 31-90 days after receiving new machine per insurance requirements.   Obesity -encourage weight management effort

## 2023-04-08 ENCOUNTER — Ambulatory Visit: Payer: Medicare PPO | Admitting: Internal Medicine

## 2023-04-08 ENCOUNTER — Encounter: Payer: Self-pay | Admitting: Internal Medicine

## 2023-04-08 VITALS — BP 110/60 | HR 80 | Ht 62.0 in | Wt 235.0 lb

## 2023-04-08 DIAGNOSIS — G4733 Obstructive sleep apnea (adult) (pediatric): Secondary | ICD-10-CM | POA: Diagnosis not present

## 2023-04-08 DIAGNOSIS — E669 Obesity, unspecified: Secondary | ICD-10-CM | POA: Diagnosis not present

## 2023-04-08 NOTE — Patient Instructions (Signed)
Order- DME [facility]- please replace old CPAP machine- making noise- auto 10-20, mask of choice, humidifier, supplies, AirView/ card

## 2023-04-15 DIAGNOSIS — Z6841 Body Mass Index (BMI) 40.0 and over, adult: Secondary | ICD-10-CM | POA: Diagnosis not present

## 2023-04-15 DIAGNOSIS — S29011A Strain of muscle and tendon of front wall of thorax, initial encounter: Secondary | ICD-10-CM | POA: Diagnosis not present

## 2023-04-15 DIAGNOSIS — R079 Chest pain, unspecified: Secondary | ICD-10-CM | POA: Diagnosis not present

## 2023-04-18 DIAGNOSIS — G4733 Obstructive sleep apnea (adult) (pediatric): Secondary | ICD-10-CM | POA: Diagnosis not present

## 2023-04-19 DIAGNOSIS — G4733 Obstructive sleep apnea (adult) (pediatric): Secondary | ICD-10-CM | POA: Diagnosis not present

## 2023-04-24 DIAGNOSIS — G4733 Obstructive sleep apnea (adult) (pediatric): Secondary | ICD-10-CM | POA: Diagnosis not present

## 2023-04-25 ENCOUNTER — Ambulatory Visit: Payer: Medicare PPO | Admitting: Internal Medicine

## 2023-05-17 DIAGNOSIS — R519 Headache, unspecified: Secondary | ICD-10-CM | POA: Diagnosis not present

## 2023-05-17 DIAGNOSIS — N1831 Chronic kidney disease, stage 3a: Secondary | ICD-10-CM | POA: Diagnosis not present

## 2023-05-17 DIAGNOSIS — I10 Essential (primary) hypertension: Secondary | ICD-10-CM | POA: Diagnosis not present

## 2023-05-19 DIAGNOSIS — G4733 Obstructive sleep apnea (adult) (pediatric): Secondary | ICD-10-CM | POA: Diagnosis not present

## 2023-05-23 DIAGNOSIS — L089 Local infection of the skin and subcutaneous tissue, unspecified: Secondary | ICD-10-CM | POA: Diagnosis not present

## 2023-05-23 DIAGNOSIS — L6681 Central centrifugal cicatricial alopecia: Secondary | ICD-10-CM | POA: Diagnosis not present

## 2023-05-23 DIAGNOSIS — L658 Other specified nonscarring hair loss: Secondary | ICD-10-CM | POA: Diagnosis not present

## 2023-05-27 ENCOUNTER — Other Ambulatory Visit: Payer: Self-pay | Admitting: Family Medicine

## 2023-05-27 DIAGNOSIS — Z1231 Encounter for screening mammogram for malignant neoplasm of breast: Secondary | ICD-10-CM

## 2023-06-16 DIAGNOSIS — G4733 Obstructive sleep apnea (adult) (pediatric): Secondary | ICD-10-CM | POA: Diagnosis not present

## 2023-06-20 NOTE — Progress Notes (Signed)
 HPI F never smoker seen for OSA  Insomnia, complicated by morbid obesity/ gastric sleeve bariatric surgery, HBP, Upper Airway Cough, GERD,  NPSG 05/23/12- AHI 42.6 per hour. Weight 267 pounds Office spirometry 04/30/18- .WNL --------------------------------------------------------------------   04/08/23- 78 year old female never smoker followed for OSA, Insomnia, complicated by morbid obesity/ gastric sleeve bariatric surgery, HTN, Upper Airway Cough, GERD, Covid infection Jan 2021, Mitral Valve Regurgitation,  CPAP auto 10-20/APS/ [employer]     AirSense 10 AutoSet Download-compliance  73%, AHI 9/hr     (pressure range 11.9-15.1)   central > obstructive      frequent missed nights Body weight today-   235lbs Discussed the use of AI scribe software for clinical note transcription with the patient, who gave verbal consent to proceed. History of Present Illness   The patient, with a history of sleep apnea, presents for a CPAP machine replacement. She reports that her current machine is noisy and has caused her to miss several nights of therapy. Despite this, she reports that she sleeps better when she uses the machine and can get between four to six hours of sleep. She also reports that she sometimes does not take the machine with her when she is not home.  In addition to her sleep apnea, the patient reports no changes in her breathing and denies any new cardiac symptoms. She also reports a persistent issue with a noisy CPAP machine that has led to missed nights of therapy.   Assessment and Plan:    Obstructive Sleep Apnea Patient reports old CPAP machine is noisy and has missed several nights of use. Patient reports better sleep with CPAP use. -Order replacement CPAP machine with same settings. -Schedule follow-up appointment within 31-90 days after receiving new machine per insurance requirements.  Obesity -encourage weight management effort      06/21/23- 78 year old female never smoker followed  for OSA, Insomnia, complicated by morbid obesity/ gastric sleeve bariatric surgery, HTN, Upper Airway Cough, GERD, Covid infection Jan 2021, Mitral Valve Regurgitation,  CPAP auto 5-20/APS/ [employer]     AirSense 11 AutoSet    replacement order 04/08/23 Download-compliance    80%, AHI 4.5/hr     few short or missed nights   (pressure range 9-14) Body weight today-   243 lbs Discussed the use of AI scribe software for clinical note transcription with the patient, who gave verbal consent to proceed. History of Present Illness   The patient, with a history of sleep apnea, is sleeping very much better since old machine replaced. Download reviewed with her. She missed a few nights with a cold, but is meeting compliance goals.  She reports new onset of coughing at night when using her CPAP machine. The coughing, which lasts for about four to five minutes, does not occur when the CPAP machine is not in use. The patient does not cough up anything and has found that sucking on a cough drop or mint can help alleviate the cough. She also reports a sensation of dry mucus in the back of her throat in the mornings, which she attributes to the dry air from the CPAP machine. The patient denies any daytime coughing or use of any breathing medications. She also denies any symptoms of acid reflux or heartburn, but does note that she sleeps slightly propped up with an adjustable bed. I suggested raising head a little more, and we will get CXR. We discussed turning up humidifier a little.  The patient reports significant improvement in her sleep since switching  to a new CPAP machine, now sleeping seven to nine hours a night. The new machine has a pressure range of five to twenty, with the patient's usage centering around twelve to thirteen centimeters of water pressure. This has resulted in fewer than five breakthrough apneas an hour.     ROS-see HPI   + = positive Constitutional:   +   weight loss, night sweats, fevers,  chills, fatigue, lassitude. HEENT:   No-  headaches, difficulty swallowing, tooth/dental problems, sore throat,       No-  sneezing, itching, ear ache, +nasal congestion, post nasal drip,  CV:  No-   chest pain, orthopnea, PND, swelling in lower extremities, anasarca, dizziness, palpitations Resp: +shortness of breath with exertion or at rest.       No-   productive cough,  +non-productive cough,  No- coughing up of blood.              No-   change in color of mucus.  No- wheezing.   Skin: No-   rash or lesions. GI:  No-   heartburn, indigestion, abdominal pain, nausea, vomiting,  GU:  MS:  +  joint pain or swelling.   Neuro-     nothing unusual Psych:  No- change in mood or affect. No depression or anxiety.  No memory loss.  OBJ- Physical Exam General- Alert, Oriented, Affect-appropriate, Distress- none acute. + Obese  Skin- rash-none, lesions- none, excoriation- none Lymphadenopathy- none Head- atraumatic            Eyes- Gross vision intact, PERRLA, conjunctivae and secretions clear            Ears- Hearing, canals-normal            Nose- Clear, no-Septal dev, mucus, polyps, erosion, perforation             Throat- Mallampati II-III , mucosa clear , drainage- none, tonsils- atrophic, +teeth Neck- flexible , trachea midline, no stridor , thyroid nl, carotid no bruit Chest - symmetrical excursion , unlabored           Heart/CV- RRR , no murmur , no gallop  , no rub, nl s1 s2                           - JVD- none , edema- none, stasis changes- none, varices- none           Lung- clear to P&A, wheeze- none, cough-none, dullness-none, rub- none           Chest wall-  Abd-  Br/ Gen/ Rectal- Not done, not indicated Extrem- cyanosis- none, clubbing, none, atrophy- none, strength- nl Neuro- grossly intact to observation  Assessment and Plan:    Obstructive Sleep Apnea She is using a CPAP machine effectively with fewer than five apneas per hour, achieving treatment goals. Reports  improved sleep duration. Comfortable with current settings. - Continue current CPAP settings. - Advise to contact if CPAP settings become uncomfortable.  Nocturnal Cough Cough occurs at night with CPAP use, possibly due to positional changes or mild gastroesophageal reflux. No daytime cough. Chest x-ray needed to rule out other causes. - Order chest x-ray. - Advise to increase CPAP humidifier setting. - Recommend sleeping with head slightly elevated. - Advise to contact if cough worsens or persists.

## 2023-06-21 ENCOUNTER — Ambulatory Visit

## 2023-06-21 ENCOUNTER — Encounter: Payer: Self-pay | Admitting: Internal Medicine

## 2023-06-21 ENCOUNTER — Ambulatory Visit: Payer: Medicare PPO | Admitting: Internal Medicine

## 2023-06-21 VITALS — BP 136/64 | HR 74 | Temp 97.6°F | Ht 62.0 in | Wt 243.0 lb

## 2023-06-21 DIAGNOSIS — R059 Cough, unspecified: Secondary | ICD-10-CM | POA: Diagnosis not present

## 2023-06-21 DIAGNOSIS — G4733 Obstructive sleep apnea (adult) (pediatric): Secondary | ICD-10-CM

## 2023-06-21 DIAGNOSIS — R0989 Other specified symptoms and signs involving the circulatory and respiratory systems: Secondary | ICD-10-CM | POA: Diagnosis not present

## 2023-06-21 NOTE — Patient Instructions (Signed)
 We can continue CPAP auto 5-20  Order- CXR   dx Cough  Try sleeping with the head of your bed just a little higher. See if that helps the cough any.

## 2023-07-01 ENCOUNTER — Ambulatory Visit
Admission: RE | Admit: 2023-07-01 | Discharge: 2023-07-01 | Disposition: A | Discharge status: Home / Self Care | Payer: Medicare PPO | Source: Ambulatory Visit | Attending: Family Medicine | Admitting: Family Medicine

## 2023-07-01 DIAGNOSIS — Z1231 Encounter for screening mammogram for malignant neoplasm of breast: Secondary | ICD-10-CM

## 2023-07-08 ENCOUNTER — Ambulatory Visit: Payer: Medicare PPO | Admitting: Internal Medicine

## 2023-07-10 DIAGNOSIS — M1990 Unspecified osteoarthritis, unspecified site: Secondary | ICD-10-CM | POA: Diagnosis not present

## 2023-07-10 DIAGNOSIS — N1831 Chronic kidney disease, stage 3a: Secondary | ICD-10-CM | POA: Diagnosis not present

## 2023-07-10 DIAGNOSIS — Z7982 Long term (current) use of aspirin: Secondary | ICD-10-CM | POA: Diagnosis not present

## 2023-07-10 DIAGNOSIS — I129 Hypertensive chronic kidney disease with stage 1 through stage 4 chronic kidney disease, or unspecified chronic kidney disease: Secondary | ICD-10-CM | POA: Diagnosis not present

## 2023-07-10 DIAGNOSIS — I700 Atherosclerosis of aorta: Secondary | ICD-10-CM | POA: Diagnosis not present

## 2023-07-10 DIAGNOSIS — E785 Hyperlipidemia, unspecified: Secondary | ICD-10-CM | POA: Diagnosis not present

## 2023-07-10 DIAGNOSIS — G4733 Obstructive sleep apnea (adult) (pediatric): Secondary | ICD-10-CM | POA: Diagnosis not present

## 2023-07-10 DIAGNOSIS — F325 Major depressive disorder, single episode, in full remission: Secondary | ICD-10-CM | POA: Diagnosis not present

## 2023-07-17 DIAGNOSIS — G4733 Obstructive sleep apnea (adult) (pediatric): Secondary | ICD-10-CM | POA: Diagnosis not present

## 2023-07-19 ENCOUNTER — Other Ambulatory Visit: Payer: Self-pay | Admitting: Family Medicine

## 2023-07-19 DIAGNOSIS — N6320 Unspecified lump in the left breast, unspecified quadrant: Secondary | ICD-10-CM

## 2023-07-30 ENCOUNTER — Ambulatory Visit
Admission: RE | Admit: 2023-07-30 | Discharge: 2023-07-30 | Disposition: A | Discharge status: Home / Self Care | Source: Ambulatory Visit | Attending: Family Medicine | Admitting: Family Medicine

## 2023-07-30 ENCOUNTER — Other Ambulatory Visit: Payer: Self-pay | Admitting: Family Medicine

## 2023-07-30 DIAGNOSIS — N6325 Unspecified lump in the left breast, overlapping quadrants: Secondary | ICD-10-CM | POA: Diagnosis not present

## 2023-07-30 DIAGNOSIS — N6320 Unspecified lump in the left breast, unspecified quadrant: Secondary | ICD-10-CM

## 2023-07-30 DIAGNOSIS — N6310 Unspecified lump in the right breast, unspecified quadrant: Secondary | ICD-10-CM

## 2023-07-30 DIAGNOSIS — N6315 Unspecified lump in the right breast, overlapping quadrants: Secondary | ICD-10-CM | POA: Diagnosis not present

## 2023-08-07 DIAGNOSIS — G4733 Obstructive sleep apnea (adult) (pediatric): Secondary | ICD-10-CM | POA: Diagnosis not present

## 2023-08-14 DIAGNOSIS — M25511 Pain in right shoulder: Secondary | ICD-10-CM | POA: Diagnosis not present

## 2023-08-14 DIAGNOSIS — M25551 Pain in right hip: Secondary | ICD-10-CM | POA: Diagnosis not present

## 2023-08-16 DIAGNOSIS — G4733 Obstructive sleep apnea (adult) (pediatric): Secondary | ICD-10-CM | POA: Diagnosis not present

## 2023-09-03 DIAGNOSIS — M25552 Pain in left hip: Secondary | ICD-10-CM | POA: Diagnosis not present

## 2023-09-12 DIAGNOSIS — M25552 Pain in left hip: Secondary | ICD-10-CM | POA: Diagnosis not present

## 2023-09-13 ENCOUNTER — Emergency Department (HOSPITAL_BASED_OUTPATIENT_CLINIC_OR_DEPARTMENT_OTHER)
Admission: EM | Admit: 2023-09-13 | Discharge: 2023-09-14 | Disposition: A | Discharge status: Home / Self Care | Attending: Emergency Medicine | Admitting: Emergency Medicine

## 2023-09-13 ENCOUNTER — Other Ambulatory Visit: Payer: Self-pay

## 2023-09-13 ENCOUNTER — Encounter (HOSPITAL_BASED_OUTPATIENT_CLINIC_OR_DEPARTMENT_OTHER): Payer: Self-pay

## 2023-09-13 DIAGNOSIS — M25552 Pain in left hip: Secondary | ICD-10-CM | POA: Diagnosis not present

## 2023-09-13 NOTE — ED Triage Notes (Signed)
 Complaining of pain in the groin on the left side where she received a steroid shot for her hip. Was given on the 20 th and has been hurting since.

## 2023-09-13 NOTE — ED Provider Notes (Signed)
 [place] EMERGENCY DEPARTMENT AT [facility] Provider Note   CSN: [id number] Arrival date & time: 09/13/23  2047     History Chief Complaint  Patient presents with   Groin Pain    HPI Beth Sullivan is a 78 y.o. female presenting for left hip pain. Hx of chronic pain. Got a steroid injection in the left hip  but feels it went into the soft tissues and has had pain at the site ever since On NSAIDs and Tylenol  with minimal improvement.   Patient's recorded medical, surgical, social, medication list and allergies were reviewed in the Snapshot window as part of the initial history.   Review of Systems   Review of Systems  Constitutional:  Negative for chills and fever.  HENT:  Negative for ear pain and sore throat.   Eyes:  Negative for pain and visual disturbance.  Respiratory:  Negative for cough and shortness of breath.   Cardiovascular:  Negative for chest pain and palpitations.  Gastrointestinal:  Negative for abdominal pain and vomiting.  Genitourinary:  Negative for dysuria and hematuria.  Musculoskeletal:  Negative for arthralgias and back pain.  Skin:  Negative for color change and rash.  Neurological:  Negative for seizures and syncope.  All other systems reviewed and are negative.   Physical Exam Updated Vital Signs BP (!) 168/87 (BP Location: Right Arm)   Pulse 65   Temp 98.5 F (36.9 C) (Oral)   Resp 18   Ht 5\' 2"  (1.575 m)   Wt 105.2 kg   SpO2 99%   BMI 42.43 kg/m  Physical Exam Vitals and nursing note reviewed.  Constitutional:      General: She is not in acute distress.    Appearance: She is well-developed.  HENT:     Head: Normocephalic and atraumatic.  Eyes:     Conjunctiva/sclera: Conjunctivae normal.  Cardiovascular:     Rate and Rhythm: Normal rate and regular rhythm.     Heart sounds: No murmur heard. Pulmonary:     Effort: Pulmonary effort is normal. No respiratory distress.     Breath sounds: Normal breath sounds.   Abdominal:     General: There is no distension.     Palpations: Abdomen is soft.     Tenderness: There is no abdominal tenderness. There is no right CVA tenderness or left CVA tenderness.  Musculoskeletal:        General: No swelling or tenderness. Normal range of motion.     Cervical back: Neck supple.  Skin:    General: Skin is warm and dry.  Neurological:     General: No focal deficit present.     Mental Status: She is alert and oriented to person, place, and time. Mental status is at baseline.     Cranial Nerves: No cranial nerve deficit.      ED Course/ Medical Decision Making/ A&P    Procedures Procedures   Medications Ordered in ED Medications  oxyCODONE  (Oxy IR/ROXICODONE ) immediate release tablet 5 mg (has no administration in time range)    Medical Decision Making:   Beth Sullivan is a 78 year old female presenting with severe left sided hip pain.  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  Followed with sports medicine and had a joint injection 11 days ago.  Pain initially improved and then approximately 3 days ago became severe again.  No new injuries or falls.  Denies fevers chills nausea vomiting syncope shortness of breath.  States when she  went to get in the bed tonight she had a severe spasming like sensation and 10 out of 10 pain.  It is now completely resolved. Denies any other symptoms. Her history of present illness physical exam findings are most consistent with ongoing arthritis, possibly adverse reaction to recent joint injection.  She does not clinically have septic arthritis and has no mechanism for a fracture at this time. She is ambulatory.  Discussed supportive care and patient was comfortable following up in the outpatient setting.  Discussed risk of falls with pain medication, however patient states that the pain is too severe for her to function at home without some sort of pain control. She expressed understanding of risk of fall and  appropriate utilization of narcotic pain medication.  Disposition:  I have considered need for hospitalization, however, considering all of the above, I believe this patient is stable for discharge at this time.  Patient/family educated about specific return precautions for given chief complaint and symptoms.  Patient/family educated about follow-up with PCP.     Patient/family expressed understanding of return precautions and need for follow-up. Patient spoken to regarding all imaging and laboratory results and appropriate follow up for these results. All education provided in verbal form with additional information in written form. Time was allowed for answering of patient questions. Patient discharged.    Emergency Department Medication Summary:   Medications  oxyCODONE  (Oxy IR/ROXICODONE ) immediate release tablet 5 mg (has no administration in time range)        Clinical Impression:  1. Pain of left hip      Discharge   Final Clinical Impression(s) / ED Diagnoses Final diagnoses:  Pain of left hip    Rx / DC Orders ED Discharge Orders          Ordered    oxyCODONE  (ROXICODONE ) 5 MG immediate release tablet  Every 6 hours PRN        09/14/23 0014              Onetha Bile, MD 09/14/23 1610

## 2023-09-14 MED ORDER — OXYCODONE HCL 5 MG PO TABS
5.0000 mg | ORAL_TABLET | Freq: Once | ORAL | Status: AC
  Administered 2023-09-14: 5 mg via ORAL
  Filled 2023-09-14: qty 1

## 2023-09-14 MED ORDER — OXYCODONE HCL 5 MG PO TABS: 5.0000 mg | ORAL_TABLET | Freq: Four times a day (QID) | ORAL | 0 refills | Status: AC | PRN

## 2023-09-16 ENCOUNTER — Ambulatory Visit: Payer: Self-pay

## 2023-09-16 DIAGNOSIS — G4733 Obstructive sleep apnea (adult) (pediatric): Secondary | ICD-10-CM | POA: Diagnosis not present

## 2023-09-16 NOTE — Telephone Encounter (Signed)
 Chief Complaint: hip pain Symptoms: hip and groin pain Frequency: x 2 weeks Pertinent Negatives: Patient denies  Disposition: [] ED /[] Urgent Care (no appt availability in office) / [] Appointment(In office/virtual)/ []  [facility]/ [] Home Care/ [] Refused Recommended Disposition /[] [facility]/ [x]  Follow-up with PCP  Additional Notes: Pt states that pain in her left hip/groin area. States she had a injection for hip pain in that area on 09/03/23.  States that pain started about 2 days after her injection. Pt states injection was done at [facility] and she did reach out to them about the pain. States that now having some numbness in the hip area that was not there prior to the injection. States pain is a stinging burning feeling and starts off mild and by the end of the day its a 10/10. Diclofenac and was seen in the ED for the pain and given Oxycodone . Pt states that she has a PCP and doesn't want to switch, she was asking about a referral to Ortho care. Instructed pt to f/u with her PCP for a referral.   Copied from CRM [id number]. Topic: Clinical - Red Word Triage >> Sep 16, 2023  8:41 AM Baldemar Lev wrote: Red Word that prompted transfer to Nurse Triage: Hudson Madeira severe pain in groin area where she received an injection, not constant, feels it getting in and out the bed/car or up and down stairs. Reason for Disposition  [1] MILD pain (e.g., does not interfere with normal activities) AND [2] present > 7 days  Answer Assessment - Initial Assessment Questions 1. LOCATION and RADIATION: "Where is the pain located?"      Left hip 2. QUALITY: "What does the pain feel like?"  (e.g., sharp, dull, aching, burning)     Stinging and burning 3. SEVERITY: "How bad is the pain?" "What does it keep you from doing?"   (Scale 1-10; or mild, moderate, severe)   -  MILD (1-3): doesn't interfere with normal activities    -  MODERATE (4-7): interferes with normal activities (e.g., work or school) or  awakens from sleep, limping    -  SEVERE (8-10): excruciating pain, unable to do any normal activities, unable to walk     Mild now 4. ONSET: "When did the pain start?" "Does it come and go, or is it there all the time?"     5/22 5. WORK OR EXERCISE: "Has there been any recent work or exercise that involved this part of the body?"      Injection in the hip 6. CAUSE: "What do you think is causing the hip pain?"      injection 7. AGGRAVATING FACTORS: "What makes the hip pain worse?" (e.g., walking, climbing stairs, running)     Stair and walking.  8. OTHER SYMPTOMS: "Do you have any other symptoms?" (e.g., back pain, pain shooting down leg,  fever, rash)     no  Protocols used: Hip Pain-A-AH

## 2023-09-19 ENCOUNTER — Encounter: Payer: Self-pay | Admitting: Physician Assistant

## 2023-09-19 ENCOUNTER — Other Ambulatory Visit (INDEPENDENT_AMBULATORY_CARE_PROVIDER_SITE_OTHER): Payer: Self-pay

## 2023-09-19 ENCOUNTER — Ambulatory Visit (INDEPENDENT_AMBULATORY_CARE_PROVIDER_SITE_OTHER): Admitting: Physician Assistant

## 2023-09-19 VITALS — Ht 62.8 in | Wt 232.8 lb

## 2023-09-19 DIAGNOSIS — M25552 Pain in left hip: Secondary | ICD-10-CM | POA: Diagnosis not present

## 2023-09-19 MED ORDER — METHYLPREDNISOLONE ACETATE 40 MG/ML IJ SUSP
40.0000 mg | INTRAMUSCULAR | Status: AC | PRN
  Administered 2023-09-19: 40 mg via INTRA_ARTICULAR

## 2023-09-19 MED ORDER — LIDOCAINE HCL 1 % IJ SOLN
3.0000 mL | INTRAMUSCULAR | Status: AC | PRN
  Administered 2023-09-19: 3 mL

## 2023-09-19 MED ORDER — GABAPENTIN 300 MG PO CAPS: 300.0000 mg | ORAL_CAPSULE | Freq: Every day | ORAL | 0 refills | Status: DC

## 2023-09-19 NOTE — Progress Notes (Signed)
 Office Visit Note   Patient: Beth Sullivan           Date of Birth: 07/06/1945           MRN: [id number] Visit Date: 09/19/2023              Requested by: Alejandro Hurt, FNP [phone] [address] [place],  [place] [address] PCP: Alejandro Hurt, FNP   Assessment & Plan: Visit Diagnoses:  1. Pain in left hip     Plan:  Given her exquisite tenderness over the trochanteric region left hip she is given a trochanteric injection.  The patient was not having any burning pain office visit today I cannot recreate the pain.  Therefore we will have her follow-up in 2 weeks she is to keep a detailed journal of when the pain occurs or if it occurs and the nature of the pain area in which it occurs.  She is placed on gabapentin  due to the burning nature of this pain.  Differential would include meralgia paresthetica.  Follow-Up Instructions: Return in about 2 weeks (around 10/03/2023).   Orders:  Orders Placed This Encounter  Procedures   XR HIP UNILAT W OR W/O PELVIS 2-3 VIEWS LEFT   Meds ordered this encounter  Medications   gabapentin  (NEURONTIN ) 300 MG capsule    Sig: Take 1 capsule (300 mg total) by mouth at bedtime.    Dispense:  30 capsule    Refill:  0      Procedures: Large Joint Inj: L greater trochanter on 09/19/2023 5:51 PM Indications: pain Details: 22 G 1.5 in needle, lateral approach  Arthrogram: No  Medications: 3 mL lidocaine  1 %; 40 mg methylPREDNISolone  acetate 40 MG/ML Outcome: tolerated well, no immediate complications Procedure, treatment alternatives, risks and benefits explained, specific risks discussed. Consent was given by the patient. Immediately prior to procedure a time out was called to verify the correct patient, procedure, equipment, support staff and site/side marked as required. Patient was prepped and draped in the usual sterile fashion.       Clinical Data: No additional findings.   Subjective: Chief Complaint  Patient presents  with   Left Hip - Pain    HPI Beth Sullivan 78 year old female were seen for the first time today for left hip pain.  She states she has been told she has arthritis both hips has had prior injection in the right hip which gave her good relief.  Then she had a left hip injection on 09/03/2023 at [facility] physicians.  Notes initially she had pain but then presently 8 days after the injection developed severe pain.  She states since that injection she has had groin pain.  She describes a constant burning pain weakness states it grabs her.  Tearing sensation at times ranks pain to be 10 out of 10 pain.  However today she is having no pain in the hip.  Did have an episode of burning pain this morning that was short-lived.  She denies any numbness tingling down the leg.  She has tried Tylenol  for the pain.  She was seen in the ER and was given oxycodone  for pain.  She notes that she cannot stand tightfitting clothing like underwear or pants due to the pain it causes in her groin area.  She has no numbness tingling in thigh otherwise.  Again pain is described as a burning pain.  Review of Systems See HPI otherwise negative  Objective: Vital Signs: Ht 5'  2.8" (1.595 m)   Wt 232 lb 12.8 oz (105.6 kg)   BMI 41.51 kg/m   Physical Exam Constitutional:      Appearance: She is not ill-appearing or diaphoretic.  Pulmonary:     Effort: Pulmonary effort is normal.  Neurological:     Mental Status: She is alert and oriented to person, place, and time.     Ortho Exam Bilateral hips: Good range of motion without pain.  Cervical flexion of both hips causes no significant pain.  Tenderness over the left trochanteric region.  No tenderness over the right hip trochanteric region.  Specialty Comments:  No specialty comments available.  Imaging: XR HIP UNILAT W OR W/O PELVIS 2-3 VIEWS LEFT Result Date: 09/19/2023 AP pelvis lateral view of the left hip.  Details of anatomy is somewhat diminished secondary to  overlying soft tissue.  Bilateral hips well located.  Mild narrowing of both hips.  Slight pincer type impingement bilaterally left greater than right.  No acute fracture or acute findings.  No cystic changes or evidence of AVN.    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 04/30/2017   Essential hypertension 08/26/2016   Dyspnea on exertion 08/24/2016   Atypical chest pain 08/24/2016   Rapid heartbeat 08/24/2016   Insomnia 05/13/2016   Acute bronchitis 09/21/2012   Obstructive sleep apnea 05/29/2012   Past Medical History:  Diagnosis Date   Allergic rhinitis    Back pain    Chest pain    Chronic headaches    "ice pick headaches"   Chronic pain disorder    From long-standing cervical spine disease - chronic pain management, history of   CKD (chronic kidney disease), stage II    Per PCP notes - Cr 0.9-1.3   Constipation    Depression    GERD (gastroesophageal reflux disease)    Hair loss    History of colon polyps 02/2014   Dr. Achilles Achilles   Hyperlipemia    Hypertension    Joint pain    Lactose intolerance    Lower extremity edema    Morbid obesity with BMI of 45.0-49.9, adult (HCC)    Palpitations    Pre-diabetes    Sleep apnea    uses a cpap - Dr. Linder Revere   Swallowing difficulty    Vitamin D deficiency     Family History  Problem Relation Age of Onset   Heart disease Mother        Unaware of specifics   Hypertension Mother    Diabetes Mother    Heart attack Mother    Kidney disease Mother    Diabetes Father    Hypertension Father    Kidney disease Father    Prostate  cancer Brother    Prostate cancer Brother    Hypertension Sister    Heart disease Sister        Unaware of specifics   Heart disease Brother        Our specifics   Colon cancer Maternal Grandmother    Migraines Brother    Breast cancer Neg Hx     Past Surgical History:  Procedure Laterality Date   ABDOMINAL HYSTERECTOMY  1975   BSO   BREAST EXCISIONAL BIOPSY Left 05/12/2013   papilloma   BREAST EXCISIONAL BIOPSY Left 03/30/2013   SCLEROSING DUCTAL PAPILLOMA   BREAST LUMPECTOMY WITH NEEDLE LOCALIZATION Left 05/12/2013   Procedure: BREAST LUMPECTOMY WITH NEEDLE LOCALIZATION;  Surgeon: Andy Bannister A. Cornett, MD;  Location: [facility];  Service: General;  Laterality: Left;   CESAREAN SECTION  1970   COLONOSCOPY     EYE SURGERY  2008   both cataracts   FOOT SURGERY  08-2011   Left   HAND SURGERY Right    LAPAROSCOPIC GASTRIC SLEEVE RESECTION N/A 04/30/2017   Procedure: LAPAROSCOPIC GASTRIC SLEEVE RESECTION WITH UPPER ENDO;  Surgeon: Jacolyn Matar, MD;  Location: [facility];  Service: General;  Laterality: N/A;   right CTS     TUBAL LIGATION     VESICOVAGINAL FISTULA CLOSURE W/ TAH     vocal cord surgery  07/2017   Social History   Occupational History   Occupation: Retired    Comment: Midwife for [employer]  Tobacco Use   Smoking status: Never   Smokeless tobacco: Never  Vaping Use   Vaping status: Never Used  Substance and Sexual Activity   Alcohol use: No   Drug use: No   Sexual activity: Not on file

## 2023-09-21 ENCOUNTER — Ambulatory Visit (HOSPITAL_BASED_OUTPATIENT_CLINIC_OR_DEPARTMENT_OTHER): Admitting: Physical Therapy

## 2023-09-30 DIAGNOSIS — M25552 Pain in left hip: Secondary | ICD-10-CM | POA: Diagnosis not present

## 2023-09-30 DIAGNOSIS — F40240 Claustrophobia: Secondary | ICD-10-CM | POA: Diagnosis not present

## 2023-10-01 DIAGNOSIS — M1612 Unilateral primary osteoarthritis, left hip: Secondary | ICD-10-CM | POA: Diagnosis not present

## 2023-10-01 DIAGNOSIS — M7918 Myalgia, other site: Secondary | ICD-10-CM | POA: Diagnosis not present

## 2023-10-01 DIAGNOSIS — M25552 Pain in left hip: Secondary | ICD-10-CM | POA: Diagnosis not present

## 2023-10-01 DIAGNOSIS — S76312A Strain of muscle, fascia and tendon of the posterior muscle group at thigh level, left thigh, initial encounter: Secondary | ICD-10-CM | POA: Diagnosis not present

## 2023-10-07 DIAGNOSIS — Z6841 Body Mass Index (BMI) 40.0 and over, adult: Secondary | ICD-10-CM | POA: Diagnosis not present

## 2023-10-07 DIAGNOSIS — N1831 Chronic kidney disease, stage 3a: Secondary | ICD-10-CM | POA: Diagnosis not present

## 2023-10-07 DIAGNOSIS — I10 Essential (primary) hypertension: Secondary | ICD-10-CM | POA: Diagnosis not present

## 2023-10-07 DIAGNOSIS — I700 Atherosclerosis of aorta: Secondary | ICD-10-CM | POA: Diagnosis not present

## 2023-10-07 DIAGNOSIS — R519 Headache, unspecified: Secondary | ICD-10-CM | POA: Diagnosis not present

## 2023-10-07 DIAGNOSIS — R7303 Prediabetes: Secondary | ICD-10-CM | POA: Diagnosis not present

## 2023-10-11 DIAGNOSIS — G4733 Obstructive sleep apnea (adult) (pediatric): Secondary | ICD-10-CM | POA: Diagnosis not present

## 2023-10-14 ENCOUNTER — Encounter: Payer: Self-pay | Admitting: Orthopaedic Surgery

## 2023-10-14 ENCOUNTER — Ambulatory Visit: Admitting: Orthopaedic Surgery

## 2023-10-14 DIAGNOSIS — M25552 Pain in left hip: Secondary | ICD-10-CM | POA: Diagnosis not present

## 2023-10-14 MED ORDER — PREGABALIN 75 MG PO CAPS: 75.0000 mg | ORAL_CAPSULE | Freq: Two times a day (BID) | ORAL | 0 refills | Status: AC

## 2023-10-14 MED ORDER — PREDNISONE 50 MG PO TABS: ORAL_TABLET | ORAL | 0 refills | Status: DC

## 2023-10-14 NOTE — Progress Notes (Signed)
 The patient is a 78 year old female who is seen in follow-up after she was seen recently by Tory Gaskins, PA-C.  She was sent to us  for hip pain but her hip exam was entirely normal that day.  She had had an intra-articular steroid injection and she is concerned this may have affected her nerve.  She still describes some type of pain around the lower abdomen and groin area of the left side but not deep in the joint itself.  She is a prediabetic.  She is obese with a BMI of over 40.  She is walking without assistive device and denies any significant hip pain.  Her left hip exam is normal today.  Her hip moves smoothly and fluidly with no blocks to rotation at all.  There is no pain in the groin at all.  Previous x-rays of her left hip show only minimal arthritic changes.  Again her exam is normal today.  I would like to try Lyrica for her.  Neurontin  made her too sleepy and I think Lyrica could help calm down the neuropathic pain that she is experiencing.  I would also like her to be on 50 mg of prednisone  daily for just 5 days.  Will then see her back in about 3 weeks to see how she is responding to this type of medication.  She cannot take traditional anti-inflammatories either.  I have also still recommended weight loss.

## 2023-10-16 DIAGNOSIS — G4733 Obstructive sleep apnea (adult) (pediatric): Secondary | ICD-10-CM | POA: Diagnosis not present

## 2023-11-04 ENCOUNTER — Ambulatory Visit: Admitting: Orthopaedic Surgery

## 2023-11-06 DIAGNOSIS — M25552 Pain in left hip: Secondary | ICD-10-CM | POA: Diagnosis not present

## 2023-11-06 DIAGNOSIS — M792 Neuralgia and neuritis, unspecified: Secondary | ICD-10-CM | POA: Diagnosis not present

## 2023-11-06 DIAGNOSIS — R1032 Left lower quadrant pain: Secondary | ICD-10-CM | POA: Diagnosis not present

## 2023-11-07 DIAGNOSIS — G4733 Obstructive sleep apnea (adult) (pediatric): Secondary | ICD-10-CM | POA: Diagnosis not present

## 2023-11-12 NOTE — Procedures (Signed)
Mask fit

## 2023-11-16 DIAGNOSIS — G4733 Obstructive sleep apnea (adult) (pediatric): Secondary | ICD-10-CM | POA: Diagnosis not present

## 2023-11-19 ENCOUNTER — Encounter (HOSPITAL_COMMUNITY): Payer: Self-pay | Admitting: *Deleted

## 2023-12-04 DIAGNOSIS — R251 Tremor, unspecified: Secondary | ICD-10-CM | POA: Diagnosis not present

## 2023-12-04 DIAGNOSIS — I10 Essential (primary) hypertension: Secondary | ICD-10-CM | POA: Diagnosis not present

## 2023-12-04 DIAGNOSIS — I700 Atherosclerosis of aorta: Secondary | ICD-10-CM | POA: Diagnosis not present

## 2023-12-04 DIAGNOSIS — R519 Headache, unspecified: Secondary | ICD-10-CM | POA: Diagnosis not present

## 2023-12-17 DIAGNOSIS — G4733 Obstructive sleep apnea (adult) (pediatric): Secondary | ICD-10-CM | POA: Diagnosis not present

## 2023-12-24 ENCOUNTER — Other Ambulatory Visit: Payer: Self-pay | Admitting: Nurse Practitioner

## 2023-12-24 ENCOUNTER — Ambulatory Visit
Admission: RE | Admit: 2023-12-24 | Discharge: 2023-12-24 | Disposition: A | Discharge status: Home / Self Care | Source: Ambulatory Visit | Attending: Nurse Practitioner | Admitting: Nurse Practitioner

## 2023-12-24 DIAGNOSIS — G8929 Other chronic pain: Secondary | ICD-10-CM | POA: Diagnosis not present

## 2023-12-24 DIAGNOSIS — M5416 Radiculopathy, lumbar region: Secondary | ICD-10-CM

## 2023-12-24 DIAGNOSIS — R202 Paresthesia of skin: Secondary | ICD-10-CM | POA: Diagnosis not present

## 2023-12-24 DIAGNOSIS — M792 Neuralgia and neuritis, unspecified: Secondary | ICD-10-CM | POA: Diagnosis not present

## 2023-12-24 DIAGNOSIS — M4316 Spondylolisthesis, lumbar region: Secondary | ICD-10-CM | POA: Diagnosis not present

## 2023-12-24 DIAGNOSIS — M47816 Spondylosis without myelopathy or radiculopathy, lumbar region: Secondary | ICD-10-CM | POA: Diagnosis not present

## 2023-12-24 DIAGNOSIS — M25552 Pain in left hip: Secondary | ICD-10-CM | POA: Diagnosis not present

## 2023-12-24 DIAGNOSIS — R1032 Left lower quadrant pain: Secondary | ICD-10-CM | POA: Diagnosis not present

## 2024-01-02 DIAGNOSIS — Z20828 Contact with and (suspected) exposure to other viral communicable diseases: Secondary | ICD-10-CM | POA: Diagnosis not present

## 2024-01-16 DIAGNOSIS — M25552 Pain in left hip: Secondary | ICD-10-CM | POA: Diagnosis not present

## 2024-01-16 DIAGNOSIS — G4733 Obstructive sleep apnea (adult) (pediatric): Secondary | ICD-10-CM | POA: Diagnosis not present

## 2024-01-21 DIAGNOSIS — R202 Paresthesia of skin: Secondary | ICD-10-CM | POA: Diagnosis not present

## 2024-01-21 DIAGNOSIS — R1032 Left lower quadrant pain: Secondary | ICD-10-CM | POA: Diagnosis not present

## 2024-01-21 DIAGNOSIS — G8929 Other chronic pain: Secondary | ICD-10-CM | POA: Diagnosis not present

## 2024-01-21 DIAGNOSIS — M792 Neuralgia and neuritis, unspecified: Secondary | ICD-10-CM | POA: Diagnosis not present

## 2024-01-21 DIAGNOSIS — M25552 Pain in left hip: Secondary | ICD-10-CM | POA: Diagnosis not present

## 2024-02-07 DIAGNOSIS — J3489 Other specified disorders of nose and nasal sinuses: Secondary | ICD-10-CM | POA: Diagnosis not present

## 2024-02-07 DIAGNOSIS — Z6841 Body Mass Index (BMI) 40.0 and over, adult: Secondary | ICD-10-CM | POA: Diagnosis not present

## 2024-02-07 DIAGNOSIS — G5602 Carpal tunnel syndrome, left upper limb: Secondary | ICD-10-CM | POA: Diagnosis not present

## 2024-02-07 DIAGNOSIS — R490 Dysphonia: Secondary | ICD-10-CM | POA: Diagnosis not present

## 2024-02-17 ENCOUNTER — Encounter: Payer: Self-pay | Admitting: Radiology

## 2024-02-25 DIAGNOSIS — G5602 Carpal tunnel syndrome, left upper limb: Secondary | ICD-10-CM | POA: Diagnosis not present

## 2024-02-26 DIAGNOSIS — R053 Chronic cough: Secondary | ICD-10-CM | POA: Diagnosis not present

## 2024-02-26 DIAGNOSIS — R0982 Postnasal drip: Secondary | ICD-10-CM | POA: Diagnosis not present

## 2024-03-03 DIAGNOSIS — G8929 Other chronic pain: Secondary | ICD-10-CM | POA: Diagnosis not present

## 2024-03-03 DIAGNOSIS — M792 Neuralgia and neuritis, unspecified: Secondary | ICD-10-CM | POA: Diagnosis not present

## 2024-03-03 DIAGNOSIS — M545 Low back pain, unspecified: Secondary | ICD-10-CM | POA: Diagnosis not present

## 2024-03-03 DIAGNOSIS — M25552 Pain in left hip: Secondary | ICD-10-CM | POA: Diagnosis not present

## 2024-03-05 DIAGNOSIS — L658 Other specified nonscarring hair loss: Secondary | ICD-10-CM | POA: Diagnosis not present

## 2024-03-05 DIAGNOSIS — L6681 Central centrifugal cicatricial alopecia: Secondary | ICD-10-CM | POA: Diagnosis not present

## 2024-03-19 DIAGNOSIS — M1612 Unilateral primary osteoarthritis, left hip: Secondary | ICD-10-CM | POA: Diagnosis not present

## 2024-03-19 DIAGNOSIS — G5602 Carpal tunnel syndrome, left upper limb: Secondary | ICD-10-CM | POA: Diagnosis not present

## 2024-03-19 DIAGNOSIS — Z79891 Long term (current) use of opiate analgesic: Secondary | ICD-10-CM | POA: Diagnosis not present

## 2024-03-30 ENCOUNTER — Ambulatory Visit: Attending: Cardiology | Admitting: Cardiology

## 2024-03-30 ENCOUNTER — Encounter: Payer: Self-pay | Admitting: Cardiology

## 2024-03-30 VITALS — BP 134/68 | HR 79 | Resp 16 | Ht 62.0 in | Wt 246.0 lb

## 2024-03-30 DIAGNOSIS — I700 Atherosclerosis of aorta: Secondary | ICD-10-CM | POA: Diagnosis not present

## 2024-03-30 DIAGNOSIS — G4733 Obstructive sleep apnea (adult) (pediatric): Secondary | ICD-10-CM

## 2024-03-30 DIAGNOSIS — I340 Nonrheumatic mitral (valve) insufficiency: Secondary | ICD-10-CM

## 2024-03-30 DIAGNOSIS — I10 Essential (primary) hypertension: Secondary | ICD-10-CM

## 2024-03-30 DIAGNOSIS — E782 Mixed hyperlipidemia: Secondary | ICD-10-CM | POA: Diagnosis not present

## 2024-03-30 NOTE — Patient Instructions (Signed)
 Medication Instructions:  Your physician recommends that you continue on your current medications as directed. Please refer to the Current Medication list given to you today.  *If you need a refill on your cardiac medications before your next appointment, please call your pharmacy*  Lab Work: None ordered If you have labs (blood work) drawn today and your tests are completely normal, you will receive your results only by: MyChart Message (if you have MyChart) OR A paper copy in the mail If you have any lab test that is abnormal or we need to change your treatment, we will call you to review the results.  Testing/Procedures: Echocardiogram in Nov. 2026  Follow-Up: At [facility], you and your health needs are our priority.  As part of our continuing mission to provide you with exceptional heart care, our providers are all part of one team.  This team includes your primary Cardiologist (physician) and Advanced Practice Providers or APPs (Physician Assistants and Nurse Practitioners) who all work together to provide you with the care you need, when you need it.  Your next appointment:   1 year(s)  Provider:   Madonna Large, DO    We recommend signing up for the patient portal called MyChart.  Sign up information is provided on this After Visit Summary.  MyChart is used to connect with patients for Virtual Visits (Telemedicine).  Patients are able to view lab/test results, encounter notes, upcoming appointments, etc.  Non-urgent messages can be sent to your provider as well.   To learn more about what you can do with MyChart, go to [email or web address].   Other Instructions Your physician has requested that you have an echocardiogram. Echocardiography is a painless test that uses sound waves to create images of your heart. It provides your doctor with information about the size and shape of your heart and how well your hearts chambers and valves are working. This procedure takes  approximately one hour. There are no restrictions for this procedure. Please do NOT wear cologne, perfume, aftershave, or lotions (deodorant is allowed). Please arrive 15 minutes prior to your appointment time.  Please note: We ask at that you not bring children with you during ultrasound (echo/ vascular) testing. Due to room size and safety concerns, children are not allowed in the ultrasound rooms during exams. Our front office staff cannot provide observation of children in our lobby area while testing is being conducted. An adult accompanying a patient to their appointment will only be allowed in the ultrasound room at the discretion of the ultrasound technician under special circumstances. We apologize for any inconvenience.

## 2024-03-30 NOTE — Progress Notes (Signed)
 Cardiology Office Note:  .   Date:  03/30/2024  ID:  [name] Notice, DOB July 21, 1945, MRN [id number] PCP:  Marvene Prentice SAUNDERS, FNP  Former Cardiology Providers: N/A [facility] Providers Cardiologist:  Madonna Large, DO , [facility] (established care 08/25/2020) Electrophysiologist:  None  Click to update primary MD,subspecialty MD or APP then REFRESH:1}    Chief Complaint  Patient presents with   Nonrheumatic mitral valve regurgitation   Follow-up    History of Present Illness: .   Beth Sullivan is a 78 y.o. African-American female whose past medical history and cardiovascular risk factors includes: COVID-19 infection, aortic atherosclerosis, prediabetes, hypertension, sleep apnea on CPAP, hyperlipidemia, vitamin D deficiency, postmenopausal female, advanced age.   Patient being followed by the practice given her history of mitral regurgitation.  Presents today for 1 year follow-up visit.  Since last office visit Beth Sullivan denies any anginal chest pain or heart failure symptoms.   No hospitalizations or urgent care visits for cardiovascular reasons.   She has been compliant with her medical therapy and endorses no concerns.  Weight has increased by 11 pounds since the last visit.  No structured exercise program or daily routine. Home SBP is around   Family history of premature coronary artery disease. Mom had her first myocardial infarction in her mid 9s. Sister had a heart attack about the age of 63.   Review of Systems: .   Review of Systems  Cardiovascular:  Negative for chest pain, claudication, irregular heartbeat, leg swelling, near-syncope, orthopnea, palpitations, paroxysmal nocturnal dyspnea and syncope.  Respiratory:  Negative for shortness of breath.   Hematologic/Lymphatic: Negative for bleeding problem.    Studies Reviewed:   EKG: EKG Interpretation Date/Time:  Monday March 30 2024 09:24:03 EST Text Interpretation: Normal sinus rhythm Possible Inferior  infarct , age undetermined Cannot rule out Anterior infarct , age undetermined Nonspecific T wave abnormality When compared with ECG of 29-Mar-2023 08:55, No significant change since last tracing Confirmed by Large Madonna [id number]) on 03/30/2024 9:50:26 AM  Echocardiogram: 03/15/2022: LVEF 55 to 60%, mild LVH, grade 1 diastolic dysfunction, mild MR, moderate TR  Carotid artery duplex 08/31/2021: Bilateral ICA disease <15%   Cardiac monitor: 14 day extended Holter monitor: Dominant rhythm normal sinus rhythm. Heart rate 51-128 bpm.  Avg HR 72 bpm. No atrial fibrillation, supraventricular tachycardia, nonsustained ventricular tachycardia, high grade AV block, pauses (3 seconds or longer). Total ventricular ectopic burden <1%. Total supraventricular ectopic burden <1%. Patient triggered events: 5.  Underlying rhythm normal sinus with the exception of one episode noted normal sinus with accelerated junctional rhythm (09/13/2020 at 7:31pm).  RADIOLOGY: CTA head and neck: 10/05/2020: No acute intracranial abnormality. Calcified plaque at the right common carotid bifurcation and proximal internal carotid causing less than 50% stenosis. No measurable stenosis at the left ICA origin. Patent extracranial vertebral arteries without stenosis. No significant intracranial stenosis.  Risk Assessment/Calculations:   NA   Labs:       Latest Ref Rng & Units 06/06/2020    9:47 PM 10/27/2019    1:34 PM 10/05/2019    1:38 PM  CBC  WBC 4.0 - 10.5 K/uL 8.2  4.5  5.4   Hemoglobin 12.0 - 15.0 g/dL 85.2  87.6  87.9   Hematocrit 36.0 - 46.0 % 44.8  37.9  37.2   Platelets 150 - 400 K/uL 185  196  190        Latest Ref Rng & Units 06/06/2020    9:47 PM  10/27/2019    1:34 PM 06/25/2019    8:11 AM  BMP  Glucose 70 - 99 mg/dL 894  81  81   BUN 8 - 23 mg/dL 36  30  32   Creatinine 0.44 - 1.00 mg/dL 9.03  8.91  8.84   BUN/Creat Ratio 12 - 28  28  28    Sodium 135 - 145 mmol/L 136  137  140   Potassium 3.5 -  5.1 mmol/L 4.2  4.6  4.7   Chloride 98 - 111 mmol/L 101  100  102   CO2 22 - 32 mmol/L 23  22  24    Calcium 8.9 - 10.3 mg/dL 9.5  89.9  9.9       Latest Ref Rng & Units 06/06/2020    9:47 PM 10/27/2019    1:34 PM 06/25/2019    8:11 AM  CMP  Glucose 70 - 99 mg/dL 894  81  81   BUN 8 - 23 mg/dL 36  30  32   Creatinine 0.44 - 1.00 mg/dL 9.03  8.91  8.84   Sodium 135 - 145 mmol/L 136  137  140   Potassium 3.5 - 5.1 mmol/L 4.2  4.6  4.7   Chloride 98 - 111 mmol/L 101  100  102   CO2 22 - 32 mmol/L 23  22  24    Calcium 8.9 - 10.3 mg/dL 9.5  89.9  9.9   Total Protein 6.5 - 8.1 g/dL 8.0  7.7    Total Bilirubin 0.3 - 1.2 mg/dL 1.1  0.5    Alkaline Phos 38 - 126 U/L 58  87    AST 15 - 41 U/L 24  22    ALT 0 - 44 U/L 25  14      Lab Results  Component Value Date   CHOL 176 10/27/2019   HDL 75 10/27/2019   LDLCALC 91 10/27/2019   TRIG 49 10/27/2019   No results for input(s): LIPOA in the last 8760 hours. No components found for: NTPROBNP No results for input(s): PROBNP in the last 8760 hours. No results for input(s): TSH in the last 8760 hours.  External Labs: Collected: December 2024 available in Care Everywhere Hemoglobin 14.3, hematocrit 44.2%. Platelets 211 BUN 18, creatinine 0.98. eGFR 60. Sodium 139, potassium 3.7, chloride 98, bicarb 32 AST, ALT, alkaline phosphatase within normal limits. Hemoglobin A1c 6.0. Total cholesterol 203, triglycerides 114, HDL 83, LDL 101  Physical Exam:    Today's Vitals   03/30/24 0927  BP: 134/68  Pulse: 79  Resp: 16  SpO2: 94%  Weight: 246 lb (111.6 kg)  Height: 5' 2 (1.575 m)   Body mass index is 44.99 kg/m. Wt Readings from Last 3 Encounters:  03/30/24 246 lb (111.6 kg)  09/19/23 232 lb 12.8 oz (105.6 kg)  09/13/23 232 lb (105.2 kg)    Physical Exam  Constitutional: No distress.  Age appropriate, hemodynamically stable.   Neck: No JVD present.  Cardiovascular: Normal rate, regular rhythm, S1 normal, S2 normal,  intact distal pulses and normal pulses. Exam reveals no gallop, no S3 and no S4.  No murmur heard. Pulmonary/Chest: Effort normal and breath sounds normal. No stridor. She has no wheezes. She has no rales.  Abdominal: Soft. Bowel sounds are normal. She exhibits no distension. There is no abdominal tenderness.  Musculoskeletal:        General: No edema.     Cervical back: Neck supple.  Neurological: She is alert and  oriented to person, place, and time. She has intact cranial nerves (2-12).  Skin: Skin is warm and moist.     Impression & Recommendation(s):  Impression:   ICD-10-CM   1. Nonrheumatic mitral valve regurgitation  I34.0 EKG 12-Lead    ECHOCARDIOGRAM COMPLETE    2. Atherosclerosis of aorta  I70.0 ECHOCARDIOGRAM COMPLETE    3. Mixed hyperlipidemia  E78.2 ECHOCARDIOGRAM COMPLETE    4. Essential hypertension  I10 ECHOCARDIOGRAM COMPLETE    5. Obstructive sleep apnea on CPAP  G47.33 ECHOCARDIOGRAM COMPLETE       Recommendation(s):  Nonrheumatic mitral valve regurgitation Currently remains asymptomatic. Echocardiogram November 2026 is a 3-year follow-up visit Home blood pressures are well-controlled. Medications reconciled  Atherosclerosis of aorta Mixed hyperlipidemia Continue Lipitor 10mg  po qday Followed by PCP  No labs available for review, check Care Everywhere. Patient states that she has an appointment to see PCP later this week on 04/01/2024 I will make sure the lipids have been checked.  Essential hypertension According to the patient home blood pressures are around 120 mmHg on current medical therapy. Continue chlorthalidone 25 mg 1.5 tabs daily. Continue spironolactone 12.5 mg p.o. daily Spoke to the patient with regards to considering chlorthalidone 25 mg p.o. daily and spironolactone 25 mg p.o. daily so that she does not need to cut tablets in half.  Patient states that she will discuss this further with PCP prior to making the change.  Agreeable with  the plan of care Cardiology following peripherally, managed by primary care provider.  Obstructive sleep apnea on CPAP Endorses compliance with CPAP   Orders Placed:  Orders Placed This Encounter  Procedures   EKG 12-Lead   ECHOCARDIOGRAM COMPLETE    Standing Status:   Future    Expected Date:   02/28/2025    Expiration Date:   05/29/2025    Where should this test [facility] performed[facility]    Does the patient weigh less than or greater than 250 lbs?:   Patient weighs less than 250 lbs    Perflutren DEFINITY (image enhancing agent) should be administered unless hypersensitivity or allergy exist:   Administer Perflutren    Reason for exam-Echo:   Other-Full Diagnosis List    Full ICD-10/Reason for Exam:   Mitral regurgitation [201046]   Final Medication List:   No orders of the defined types were placed in this encounter.   Medications Discontinued During This Encounter  Medication Reason   predniSONE  (DELTASONE ) 50 MG tablet      Current Outpatient Medications:    aspirin EC 81 MG tablet, Take 81 mg by mouth at bedtime., Disp: , Rfl:    atorvastatin (LIPITOR) 10 MG tablet, Take 20 mg by mouth at bedtime., Disp: , Rfl:    Calcium Carbonate (CALCIUM 500 PO), Take 500 mg by mouth every other day., Disp: , Rfl:    chlorthalidone (HYGROTON) 25 MG tablet, Take 25 mg by mouth daily. Pt taking 1.5 tablets/daily, Disp: , Rfl:    Cholecalciferol (VITAMIN D3 PO), Take 5,000 Int'l Units by mouth every other day., Disp: , Rfl:    finasteride (PROSCAR) 5 MG tablet, Take 5 mg by mouth daily. 1/2 tablet daily, Disp: , Rfl:    Multiple Vitamin (MULTIVITAMIN WITH MINERALS) TABS tablet, Take 1 tablet by mouth daily., Disp: , Rfl:    oxyCODONE  (ROXICODONE ) 5 MG immediate release tablet, Take 1 tablet (5 mg total) by mouth every 6 (six) hours as needed for severe pain (pain score  7-10)., Disp: 15 tablet, Rfl: 0   pregabalin  (LYRICA ) 75 MG capsule, Take 1 capsule (75 mg total) by mouth 2  (two) times daily., Disp: 60 capsule, Rfl: 0   spironolactone (ALDACTONE) 25 MG tablet, Take 25 mg by mouth daily. Cutting tablet in half for 12.5mg , Disp: , Rfl:    valACYclovir (VALTREX) 1000 MG tablet, Take 500 mg by mouth 2 (two) times daily. Take one tablet PRN, Disp: , Rfl:    verapamil (COVERA HS) 240 MG (CO) 24 hr tablet, Take 240 mg by mouth 2 (two) times daily. , Disp: , Rfl:    vitamin C (ASCORBIC ACID) 500 MG tablet, Take 500 mg by mouth daily., Disp: , Rfl:   Consent:   N/A  Disposition:   Echocardiogram in November 2026 and follow-up in December 2026  Her questions and concerns were addressed to her satisfaction. She voices understanding of the recommendations provided during this encounter.    Signed, Madonna Michele HAS, [facility]  A Division of [facility] [address], [place], [place] [address]   03/30/2024 6:48 PM

## 2025-02-22 ENCOUNTER — Ambulatory Visit (HOSPITAL_COMMUNITY)
